# Patient Record
Sex: Male | Born: 1955 | Race: White | Hispanic: No | Marital: Married | Smoking: Former smoker
Health system: Southern US, Community
[De-identification: ages and names within clinical notes are randomized; demographics above are authoritative.]

## PROBLEM LIST (undated history)

## (undated) DIAGNOSIS — J342 Deviated nasal septum: Secondary | ICD-10-CM

## (undated) DIAGNOSIS — E039 Hypothyroidism, unspecified: Secondary | ICD-10-CM

## (undated) DIAGNOSIS — G4700 Insomnia, unspecified: Secondary | ICD-10-CM

## (undated) DIAGNOSIS — K589 Irritable bowel syndrome without diarrhea: Secondary | ICD-10-CM

## (undated) DIAGNOSIS — T7840XA Allergy, unspecified, initial encounter: Secondary | ICD-10-CM

## (undated) DIAGNOSIS — M545 Low back pain, unspecified: Secondary | ICD-10-CM

## (undated) DIAGNOSIS — M8580 Other specified disorders of bone density and structure, unspecified site: Secondary | ICD-10-CM

## (undated) DIAGNOSIS — G43909 Migraine, unspecified, not intractable, without status migrainosus: Secondary | ICD-10-CM

## (undated) DIAGNOSIS — M1990 Unspecified osteoarthritis, unspecified site: Secondary | ICD-10-CM

## (undated) DIAGNOSIS — M51369 Other intervertebral disc degeneration, lumbar region without mention of lumbar back pain or lower extremity pain: Secondary | ICD-10-CM

## (undated) DIAGNOSIS — F419 Anxiety disorder, unspecified: Secondary | ICD-10-CM

## (undated) DIAGNOSIS — G4730 Sleep apnea, unspecified: Secondary | ICD-10-CM

## (undated) DIAGNOSIS — H269 Unspecified cataract: Secondary | ICD-10-CM

## (undated) DIAGNOSIS — J3089 Other allergic rhinitis: Secondary | ICD-10-CM

## (undated) DIAGNOSIS — L301 Dyshidrosis [pompholyx]: Secondary | ICD-10-CM

## (undated) DIAGNOSIS — G4733 Obstructive sleep apnea (adult) (pediatric): Secondary | ICD-10-CM

## (undated) DIAGNOSIS — I00 Rheumatic fever without heart involvement: Secondary | ICD-10-CM

## (undated) DIAGNOSIS — S060XAA Concussion with loss of consciousness status unknown, initial encounter: Secondary | ICD-10-CM

## (undated) DIAGNOSIS — L821 Other seborrheic keratosis: Secondary | ICD-10-CM

## (undated) DIAGNOSIS — B351 Tinea unguium: Secondary | ICD-10-CM

## (undated) DIAGNOSIS — S060X9A Concussion with loss of consciousness of unspecified duration, initial encounter: Secondary | ICD-10-CM

## (undated) DIAGNOSIS — E785 Hyperlipidemia, unspecified: Secondary | ICD-10-CM

## (undated) DIAGNOSIS — K219 Gastro-esophageal reflux disease without esophagitis: Secondary | ICD-10-CM

## (undated) DIAGNOSIS — B029 Zoster without complications: Secondary | ICD-10-CM

## (undated) DIAGNOSIS — N200 Calculus of kidney: Secondary | ICD-10-CM

## (undated) DIAGNOSIS — K900 Celiac disease: Secondary | ICD-10-CM

## (undated) DIAGNOSIS — G629 Polyneuropathy, unspecified: Secondary | ICD-10-CM

## (undated) DIAGNOSIS — N400 Enlarged prostate without lower urinary tract symptoms: Secondary | ICD-10-CM

## (undated) DIAGNOSIS — M5126 Other intervertebral disc displacement, lumbar region: Secondary | ICD-10-CM

## (undated) DIAGNOSIS — M5136 Other intervertebral disc degeneration, lumbar region: Secondary | ICD-10-CM

## (undated) HISTORY — DX: Other intervertebral disc degeneration, lumbar region without mention of lumbar back pain or lower extremity pain: M51.369

## (undated) HISTORY — DX: Anxiety disorder, unspecified: F41.9

## (undated) HISTORY — DX: Migraine, unspecified, not intractable, without status migrainosus: G43.909

## (undated) HISTORY — DX: Irritable bowel syndrome, unspecified: K58.9

## (undated) HISTORY — DX: Deviated nasal septum: J34.2

## (undated) HISTORY — DX: Other intervertebral disc degeneration, lumbar region: M51.36

## (undated) HISTORY — PX: TENNIS ELBOW RELEASE/NIRSCHEL PROCEDURE: SHX6651

## (undated) HISTORY — DX: Other allergic rhinitis: J30.89

## (undated) HISTORY — DX: Unspecified cataract: H26.9

## (undated) HISTORY — PX: VASECTOMY: SHX75

## (undated) HISTORY — DX: Rheumatic fever without heart involvement: I00

## (undated) HISTORY — PX: COLONOSCOPY: SHX174

## (undated) HISTORY — DX: Polyneuropathy, unspecified: G62.9

## (undated) HISTORY — DX: Other seborrheic keratosis: L82.1

## (undated) HISTORY — PX: OTHER SURGICAL HISTORY: SHX169

## (undated) HISTORY — DX: Allergy, unspecified, initial encounter: T78.40XA

## (undated) HISTORY — DX: Zoster without complications: B02.9

## (undated) HISTORY — PX: EYE SURGERY: SHX253

## (undated) HISTORY — DX: Tinea unguium: B35.1

## (undated) HISTORY — DX: Hyperlipidemia, unspecified: E78.5

## (undated) HISTORY — DX: Gastro-esophageal reflux disease without esophagitis: K21.9

## (undated) HISTORY — DX: Unspecified osteoarthritis, unspecified site: M19.90

## (undated) HISTORY — DX: Insomnia, unspecified: G47.00

## (undated) HISTORY — DX: Benign prostatic hyperplasia without lower urinary tract symptoms: N40.0

## (undated) HISTORY — DX: Other intervertebral disc displacement, lumbar region: M51.26

## (undated) HISTORY — DX: Low back pain, unspecified: M54.50

## (undated) HISTORY — DX: Sleep apnea, unspecified: G47.30

## (undated) HISTORY — DX: Other specified disorders of bone density and structure, unspecified site: M85.80

## (undated) HISTORY — DX: Hypothyroidism, unspecified: E03.9

## (undated) HISTORY — DX: Concussion with loss of consciousness of unspecified duration, initial encounter: S06.0X9A

## (undated) HISTORY — PX: JOINT REPLACEMENT: SHX530

## (undated) HISTORY — DX: Concussion with loss of consciousness status unknown, initial encounter: S06.0XAA

## (undated) HISTORY — DX: Dyshidrosis (pompholyx): L30.1

## (undated) HISTORY — PX: SEPTOPLASTY: SUR1290

## (undated) HISTORY — DX: Obstructive sleep apnea (adult) (pediatric): G47.33

## (undated) HISTORY — DX: Calculus of kidney: N20.0

## (undated) HISTORY — PX: REPLACEMENT TOTAL KNEE: SUR1224

## (undated) HISTORY — DX: Celiac disease: K90.0

---
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
## 2000-06-08 ENCOUNTER — Ambulatory Visit (HOSPITAL_BASED_OUTPATIENT_CLINIC_OR_DEPARTMENT_OTHER): Admission: RE | Admit: 2000-06-08 | Discharge: 2000-06-08 | Payer: Self-pay | Admitting: *Deleted

## 2005-09-08 ENCOUNTER — Ambulatory Visit (HOSPITAL_BASED_OUTPATIENT_CLINIC_OR_DEPARTMENT_OTHER): Admission: RE | Admit: 2005-09-08 | Discharge: 2005-09-08 | Payer: Self-pay | Admitting: Urology

## 2005-11-28 ENCOUNTER — Ambulatory Visit: Payer: Self-pay | Admitting: Gastroenterology

## 2006-01-25 ENCOUNTER — Other Ambulatory Visit: Admission: RE | Admit: 2006-01-25 | Discharge: 2006-01-25 | Payer: Self-pay | Admitting: Gastroenterology

## 2006-01-25 ENCOUNTER — Encounter (INDEPENDENT_AMBULATORY_CARE_PROVIDER_SITE_OTHER): Payer: Self-pay | Admitting: Specialist

## 2006-01-25 ENCOUNTER — Ambulatory Visit: Payer: Self-pay | Admitting: Gastroenterology

## 2006-03-06 ENCOUNTER — Ambulatory Visit: Payer: Self-pay | Admitting: Gastroenterology

## 2006-11-08 ENCOUNTER — Ambulatory Visit (HOSPITAL_BASED_OUTPATIENT_CLINIC_OR_DEPARTMENT_OTHER): Admission: RE | Admit: 2006-11-08 | Discharge: 2006-11-08 | Payer: Self-pay | Admitting: Otolaryngology

## 2007-06-18 ENCOUNTER — Ambulatory Visit: Payer: Self-pay | Admitting: Surgery

## 2007-06-18 ENCOUNTER — Encounter (INDEPENDENT_AMBULATORY_CARE_PROVIDER_SITE_OTHER): Payer: Self-pay | Admitting: Family Medicine

## 2007-06-18 ENCOUNTER — Ambulatory Visit (HOSPITAL_COMMUNITY): Admission: RE | Admit: 2007-06-18 | Discharge: 2007-06-18 | Payer: Self-pay | Admitting: Family Medicine

## 2008-04-23 ENCOUNTER — Encounter: Admission: RE | Admit: 2008-04-23 | Discharge: 2008-04-23 | Payer: Self-pay | Admitting: Family Medicine

## 2008-05-20 ENCOUNTER — Encounter: Payer: Self-pay | Admitting: Gastroenterology

## 2008-07-17 ENCOUNTER — Ambulatory Visit (HOSPITAL_BASED_OUTPATIENT_CLINIC_OR_DEPARTMENT_OTHER): Admission: RE | Admit: 2008-07-17 | Discharge: 2008-07-17 | Payer: Self-pay | Admitting: Otolaryngology

## 2008-07-19 ENCOUNTER — Ambulatory Visit: Payer: Self-pay | Admitting: Internal Medicine

## 2009-01-19 ENCOUNTER — Encounter: Payer: Self-pay | Admitting: Gastroenterology

## 2009-01-19 ENCOUNTER — Ambulatory Visit (HOSPITAL_COMMUNITY): Admission: RE | Admit: 2009-01-19 | Discharge: 2009-01-19 | Payer: Self-pay | Admitting: Otolaryngology

## 2009-03-10 ENCOUNTER — Encounter (INDEPENDENT_AMBULATORY_CARE_PROVIDER_SITE_OTHER): Payer: Self-pay | Admitting: *Deleted

## 2009-03-10 ENCOUNTER — Ambulatory Visit: Payer: Self-pay | Admitting: Gastroenterology

## 2009-03-10 DIAGNOSIS — K219 Gastro-esophageal reflux disease without esophagitis: Secondary | ICD-10-CM | POA: Insufficient documentation

## 2009-03-10 DIAGNOSIS — K589 Irritable bowel syndrome without diarrhea: Secondary | ICD-10-CM | POA: Insufficient documentation

## 2009-03-15 ENCOUNTER — Telehealth: Payer: Self-pay | Admitting: Gastroenterology

## 2009-03-19 ENCOUNTER — Ambulatory Visit: Payer: Self-pay | Admitting: Gastroenterology

## 2009-03-23 ENCOUNTER — Ambulatory Visit (HOSPITAL_COMMUNITY): Admission: RE | Admit: 2009-03-23 | Discharge: 2009-03-23 | Payer: Self-pay | Admitting: Gastroenterology

## 2009-03-24 ENCOUNTER — Encounter: Payer: Self-pay | Admitting: Gastroenterology

## 2009-04-07 ENCOUNTER — Ambulatory Visit: Payer: Self-pay | Admitting: Gastroenterology

## 2009-04-07 DIAGNOSIS — K3184 Gastroparesis: Secondary | ICD-10-CM | POA: Insufficient documentation

## 2009-04-16 ENCOUNTER — Telehealth: Payer: Self-pay | Admitting: Gastroenterology

## 2009-04-20 ENCOUNTER — Ambulatory Visit: Payer: Self-pay | Admitting: Gastroenterology

## 2009-04-20 DIAGNOSIS — E785 Hyperlipidemia, unspecified: Secondary | ICD-10-CM | POA: Insufficient documentation

## 2009-04-20 DIAGNOSIS — R197 Diarrhea, unspecified: Secondary | ICD-10-CM | POA: Insufficient documentation

## 2009-04-20 DIAGNOSIS — F411 Generalized anxiety disorder: Secondary | ICD-10-CM | POA: Insufficient documentation

## 2009-04-27 ENCOUNTER — Telehealth: Payer: Self-pay | Admitting: Physician Assistant

## 2009-07-12 ENCOUNTER — Telehealth: Payer: Self-pay | Admitting: Physician Assistant

## 2009-08-11 ENCOUNTER — Telehealth (INDEPENDENT_AMBULATORY_CARE_PROVIDER_SITE_OTHER): Payer: Self-pay | Admitting: *Deleted

## 2010-03-01 NOTE — Procedures (Signed)
Summary: Upper Endoscopy  Patient: Joseph Fuentes Note: All result statuses are Final unless otherwise noted.  Tests: (1) Upper Endoscopy (EGD)   EGD Upper Endoscopy       DONE     [facility]     [address]     [place], [place]  [address]           ENDOSCOPY PROCEDURE REPORT           PATIENT:  Joseph Fuentes, Joseph Fuentes  MR#:  [id number]     BIRTHDATE:  [birth year], 53 yrs. old  GENDER:  male           ENDOSCOPIST:  Judie Petit T. Russella Dar, MD, [facility]           PROCEDURE DATE:  03/19/2009     PROCEDURE:  EGD with biopsy     ASA CLASS:  Class II     INDICATIONS:  GERD           MEDICATIONS:  Fentanyl 75 mcg IV, Versed 7 mg IV     TOPICAL ANESTHETIC:  Exactacain Spray           DESCRIPTION OF PROCEDURE:   After the risks benefits and     alternatives of the procedure were thoroughly explained, informed     consent was obtained.  The [facility] GIF-H180 [id number] endoscope was     introduced through the mouth and advanced to the second portion of     the duodenum, without limitations.  The instrument was slowly     withdrawn as the mucosa was fully examined.     <<PROCEDUREIMAGES>>           The esophagus and gastroesophageal junction were completely normal     in appearance. The stomach was entered and closely examined. The     pylorus, angularis, and lesser curvature were well visualized,     including a retroflexed view of the cardia and fundus. The stomach     wall was normally distensable. The scope passed easily through the     pylorus into the duodenum.  The duodenal bulb was normal in     appearance, as was the postbulbar duodenum. Mild gastritis was     found in the antrum. It was erythematous. Multiple biopsies were     obtained and sent to pathology.  Retroflexed views revealed no     abnormalities.    The scope was then withdrawn from the patient     and the procedure completed.           COMPLICATIONS:  None           ENDOSCOPIC IMPRESSION:     1) Mild gastritis     2) GERD         RECOMMENDATIONS:     1) anti-reflux regimen with 4 " bedblocks     2) await pathology results     3) PPI:  Dexilant 60mg  po qam #34,  5 refills and omeprazole     40mg  po qpm #34,  5 refills     4) Call office to schedule an office appointment for 6-8 weeks           Tayron Hunnell T. Russella Dar, MD, [facility]           CC:  [name], MD    Christia Reading, MD           n.     Rosalie Doctor:   Venita Lick. Marlyn Rabine at 03/19/2009 03:14 PM  [name], [phone]  Note: An exclamation mark (!) indicates a result that was not dispersed into the flowsheet. Document Creation Date: 03/19/2009 3:14 PM _______________________________________________________________________  (1) Order result status: Final Collection or observation date-time: 03/19/2009 15:08 Requested date-time:  Receipt date-time:  Reported date-time:  Referring Physician:   Ordering Physician: Claudette Head [id number]) Specimen Source:  Source: Launa Grill Order Number: [id number] Lab site:

## 2010-03-01 NOTE — Progress Notes (Signed)
  Phone Note Other Incoming   Request: Send Hydrologist of Call: REQUEST FOR RECORDS FROM [facility] FORWARDED TO [facility].

## 2010-03-01 NOTE — Procedures (Signed)
Summary: pH/[facility]  pH/[facility]   Imported By: Lester Cochise 03/15/2009 10:37:16  _____________________________________________________________________  External Attachment:    Type:   Image     Comment:   External Document

## 2010-03-01 NOTE — Miscellaneous (Signed)
Summary: meds ordered in RR  Clinical Lists Changes  Medications: Added new medication of DEXILANT 60 MG CPDR (DEXLANSOPRAZOLE) by mouth q am #34, 5 refills Added new medication of OMEPRAZOLE 40 MG  CPDR (OMEPRAZOLE) 1 each day 30 minutes before meal by mouth qpm, #34, 5 refills - Signed Added new medication of ROBINUL-FORTE 2 MG  TABS (GLYCOPYRROLATE) 1 by mouth two times a day as needed #34, 11 refills - Signed Rx of OMEPRAZOLE 40 MG  CPDR (OMEPRAZOLE) 1 each day 30 minutes before meal by mouth qpm, #34, 5 refills;  #34 x 5;  Signed;  Entered by: Doristine Church RN II;  Authorized by: Meryl Dare MD [facility];  Method used: [facility]  [address] [address] [place], [place], [place]  [address], Ph: [phone], Fax: [phone] Rx of ROBINUL-FORTE 2 MG  TABS (GLYCOPYRROLATE) 1 by mouth two times a day as needed #34, 11 refills;  #34 x 11;  Signed;  Entered by: Doristine Church RN II;  Authorized by: Meryl Dare MD [facility];  Method used: [facility]  [address] [address], [place], [place], [place]  [address], Ph: [phone], Fax: [phone] Observations: Added new observation of ALLERGY REV: Done (03/19/2009 15:41)    Prescriptions: ROBINUL-FORTE 2 MG  TABS (GLYCOPYRROLATE) 1 by mouth two times a day as needed #34, 11 refills  #34 x 11   Entered by:   Doristine Church RN II   Authorized by:   Meryl Dare MD [facility]   Signed by:   Doristine Church RN II on 03/19/2009   Method used:   [facility]ron[facility] to        [facility]  [address] (retail)       [address]       [place], [place]  [address]       Ph: [phone]       Fax: [phone]   RxID:   [id number] OMEPRAZOLE 40 MG  CPDR (OMEPRAZOLE) 1 each day 30 minutes before meal by mouth qpm, #34, 5 refills  #34 x 5   Entered by:   Doristine Church RN II   Authorized by:   Meryl Dare MD [facility]   Signed by:   Doristine Church RN II on 03/19/2009  Method used:   [facility]ronically to        [facility]  [address] (retail)       [address]       [place], [place]  [address]       Ph: [phone]       Fax: [phone]   RxID:   [id number]   Appended Document: meds ordered in RR    Clinical Lists Changes  Medications: Changed medication from DEXILANT 60 MG CPDR (DEXLANSOPRAZOLE) by mouth q am #34, 5 refills to DEXILANT 60 MG CPDR (DEXLANSOPRAZOLE) by mouth q am - Signed Changed medication from OMEPRAZOLE 40 MG  CPDR (OMEPRAZOLE) 1 each day 30 minutes before meal by mouth qpm, #34, 5 refills to OMEPRAZOLE 40 MG  CPDR (OMEPRAZOLE) 1 each day 30 minutes before meal by mouth qpm - Signed Rx of DEXILANT 60 MG CPDR (DEXLANSOPRAZOLE) by mouth q am;  #90 x 2;  Signed;  Entered by: Doristine Church RN II;  Authorized by: Meryl Dare MD [facility];  Method used: Print then Give to  Patient Rx of OMEPRAZOLE 40 MG  CPDR (OMEPRAZOLE) 1 each day 30 minutes before meal by mouth qpm;  #90 x 2;  Signed;  Entered by: Doristine Church RN II;  Authorized by: Meryl Dare MD [facility];  Method used: Print then Give to Patient    Prescriptions: OMEPRAZOLE 40 MG  CPDR (OMEPRAZOLE) 1 each day 30 minutes before meal by mouth qpm  #90 x 2   Entered by:   Doristine Church RN II   Authorized by:   Meryl Dare MD [facility]   Signed by:   Doristine Church RN II on 03/19/2009   Method used:   Print then Give to Patient   RxID:   [id number] DEXILANT 60 MG CPDR (DEXLANSOPRAZOLE) by mouth q am  #90 x 2   Entered by:   Doristine Church RN II   Authorized by:   Meryl Dare MD [facility]   Signed by:   Doristine Church RN II on 03/19/2009   Method used:   Print then Give to Patient   RxID:   [id number]

## 2010-03-01 NOTE — Assessment & Plan Note (Signed)
Summary: DISTAL IMPROXIMAL CHANNEL REFLUX/YF   History of Present Illness Visit Type: Follow-up Consult Primary GI MD: Elie Goody MD [facility] Primary Provider: [name], MD Requesting Provider: Christia Reading, MD Chief Complaint: Patient has been referred back to have after going to see his ENT. He complains that his reflux has been getting worse over time. Dr. Jenne Pane ordered a PH probe in December.   History of Present Illness:   This is a 55 year old male who relates chronic problems with throat clearing and belching. He also notes sinus drainage, sinus congestion and a "mucousy cough,". Most recently, he has been treated with omeprazole 40 mg twice daily without improvement in his symptoms.  He underwent endoscopy in December 2007 which showed Candida esophagitis and no other abnormalities. Colonoscopy performed in December 2007 showed only internal hemorrhoids. He underwent an ambulatory dual probe pH study on 12/21/ 2010 off acid suppressants which showed a DeMeester score of 43.6. There was distal Channel and proximal channel abnormal acid reflux, as well as upright and recumbent reflux. Symptom correlation was good with meals, belching, and spitting mucous. Correlation with cough was fair.   GI Review of Systems    Reports acid reflux and  belching.      Denies abdominal pain, bloating, chest pain, dysphagia with liquids, dysphagia with solids, heartburn, loss of appetite, nausea, vomiting, vomiting blood, weight loss, and  weight gain.        Denies anal fissure, black tarry stools, change in bowel habit, constipation, diarrhea, diverticulosis, fecal incontinence, heme positive stool, hemorrhoids, irritable bowel syndrome, jaundice, light color stool, liver problems, rectal bleeding, and  rectal pain. Preventive Screening-Counseling & Management  Alcohol-Tobacco     Smoking Status: quit      Drug Use:  no.     Current Medications (verified): 1)  Flax Seed Oil 1000 Mg Caps  (Flaxseed (Linseed)) .... Once Daily 2)  Finasteride 5 Mg Tabs (Finasteride) .... One Tablet By Mouth Once Daily 3)  Relpax 40 Mg Tabs (Eletriptan Hydrobromide) .... One Tablet By Mouth Once Daily, Dose May Be Repeated After 2 Hours As Needed 4)  Proctosol Hc 2.5 % Crea (Hydrocortisone) .... As Needed 5)  Simvastatin 40 Mg Tabs (Simvastatin) .... One Tablet By Mouth Once Daily 6)  Flovent Hfa 44 Mcg/act Aero (Fluticasone Propionate  Hfa) .... Inhale 2 Puffs Once Daily 7)  Hydrocortisone Acetate 25 Mg Supp (Hydrocortisone Acetate) .... Insert 1 Supppository Two Times A Day Once Daily For 2 Weeks 8)  Ambien 10 Mg Tabs (Zolpidem Tartrate) .... One Tablet By Mouth Once Daily 9)  Lovaza 1 Gm Caps (Omega-3-Acid Ethyl Esters) .... 2 Capsules By Mouth Once Daily 10)  Docusate Sodium 100 Mg Tabs (Docusate Sodium) .... One Capsule By Mouth Once Daily At Bedtime 11)  Topamax 100 Mg Tabs (Topiramate) .... Once Daily 12)  Calcium Carbonate 600 Mg Tabs (Calcium Carbonate) .... Once Daily 13)  Ginkgo Biloba 120 Mg Caps (Ginkgo Biloba) .... Once Daily 14)  Synthroid 100 Mcg Tabs (Levothyroxine Sodium) .... One Tablet By Mouth Once Daily 15)  Excedrin Extra Strength 250-250-65 Mg Tabs (Aspirin-Acetaminophen-Caffeine) .... As Needed 16)  Hyoscyamine Sulfate 0.125 Mg Tabs (Hyoscyamine Sulfate) .... One Tablet By Mouth Every 4 Hours As Needed 17)  Prilosec 40 Mg Cpdr (Omeprazole) .... Take One By Mouth Two Times A Day  Allergies (verified): 1)  ! Penicillin  Past History:  Past Medical History: Last updated: 03/09/2009 GERD Gastritis Irritable Bowel Syndrome Hyperlipidemia Hypothyroidism Migraine headaches Sleep Apnea Anxiety  Disorder Rheumatic fever 1964 Hemorrhoids  Past Surgical History: Last updated: 03/09/2009 Vasectomy Septoplasty  Family History: Family History of Celiac Disease:Daughter Family History of Diabetes: Mother Family History of Heart Disease: maternal grandfather No FH of  Colon Cancer: Family History of Lung Cancer: Pateranl Grandfather  Social History: Patient is a former smoker.  Alcohol Use - yes rare Daily Caffeine Use 32 ounces per day Illicit Drug Use - no Smoking Status:  quit Drug Use:  no  Review of Systems       The patient complains of allergy/sinus, cough, coughing up blood, fatigue, headaches-new, muscle pains/cramps, and sleeping problems.         The pertinent positives and negatives are noted as above and in the HPI. All other ROS were reviewed and were negative.   Vital Signs:  Patient profile:   55 year old male Height:      72 inches Weight:      222.6 pounds BMI:     30.30 Pulse rate:   68 / minute Pulse rhythm:   regular BP sitting:   122 / 58  (right arm) Cuff size:   regular  Vitals Entered By: Harlow Mares CMA Duncan Dull) (March 10, 2009 3:17 PM)  Physical Exam  General:  Well developed, well nourished, no acute distress. Head:  Normocephalic and atraumatic. Eyes:  PERRLA, no icterus. Mouth:  No deformity or lesions, dentition normal. Lungs:  Clear throughout to auscultation. Heart:  Regular rate and rhythm; no murmurs, rubs,  or bruits. Abdomen:  Soft, nontender and nondistended. No masses, hepatosplenomegaly or hernias noted. Normal bowel sounds. Psych:  Alert and cooperative. Normal mood and affect.  Impression & Recommendations:  Problem # 1:  GERD (ICD-530.81) GERD with presumed LPR. His sinus congestion and sinus drainage is likely unrelated to GERD. His symptom control is poor. Discontinue omeprazole and begin Nexium 40 mg twice daily taken one half hour before breakfast and dinner. Intensifiy all antireflux measures. Begin the long-term use of 4" bedblocks. Rule out esophagitis and gastroparesis. The risks, benefits and alternatives to endoscopy with possible biopsy and possible dilation were discussed with the patient and they consent to proceed. The procedure will be scheduled electively. Orders: EGD  (EGD) Gastric Emptying Scan (GES)  Problem # 2:  IRRITABLE BOWEL SYNDROME (ICD-564.1) Continue hyoscyamine as needed.  Patient Instructions: 1)  Upper Endoscopy brochure given.  2)  You have been scheduled for a Gastric Empyting Scan.  3)  Pick up your prescriptions at your pharmacy.  4)  Avoid foods high in acid content ( tomatoes, citrus juices, spicy foods) . Avoid eating within 3 to 4 hours of lying down or before exercising. Do not over eat; try smaller more frequent meals. Elevate head of bed four inches when sleeping.  5)  Copy sent to : [name], MD 6)                         Christia Reading, MD 7)  The medication list was reviewed and reconciled.  All changed / newly prescribed medications were explained.  A complete medication list was provided to the patient / caregiver.  Prescriptions: NEXIUM 40 MG CPDR (ESOMEPRAZOLE MAGNESIUM) one tablet by mouth two times a day before meals  #60 x 11   Entered by:   Christie Nottingham CMA (AAMA)   Authorized by:   Meryl Dare MD [facility]   Signed by:   Meryl Dare MD FACG on 03/10/2009  Method used:   [facility]       [address]       [place], [place]  [address]       Ph: [phone]       Fax: [phone]   RxID:   [id number]

## 2010-03-01 NOTE — Letter (Signed)
Summary: [facility]  [facility]   Imported By: Lester Corning 03/15/2009 10:35:42  _____________________________________________________________________  External Attachment:    Type:   Image     Comment:   External Document

## 2010-03-01 NOTE — Assessment & Plan Note (Signed)
Summary: f/u Gastroparesis/all   History of Present Illness Visit Type: Follow-up Visit Primary GI MD: Elie Goody MD [facility] Primary Provider: [name], MD Requesting Provider: n/a Chief Complaint: F/u for gastroparesis and procedures. Pt denies any GI complaints at this time History of Present Illness:   Joseph Fuentes returns for followup today with his wife. He states his reflux symptoms are under better control with Dexilant in the morning and omeprazole in the evening. He underwent a gastric emptying scan following results:  Calculated retention in the stomach is 92% at 1 hour and 50% at 2 hours.  Normal retention is less than 30% at 2 hours.  IMPRESSION: Moderately delayed solid gastric emptying.  He is having occasional episodes of postprandial urgency, and diarrhea, typical for his IBS.     GI Review of Systems      Denies abdominal pain, acid reflux, belching, bloating, chest pain, dysphagia with liquids, dysphagia with solids, heartburn, loss of appetite, nausea, vomiting, vomiting blood, weight loss, and  weight gain.      Reports diarrhea.     Denies anal fissure, black tarry stools, change in bowel habit, constipation, diverticulosis, fecal incontinence, heme positive stool, hemorrhoids, irritable bowel syndrome, jaundice, light color stool, liver problems, rectal bleeding, and  rectal pain.   Current Medications (verified): 1)  Flax Seed Oil 1000 Mg Caps (Flaxseed (Linseed)) .... Once Daily 2)  Relpax 40 Mg Tabs (Eletriptan Hydrobromide) .... One Tablet By Mouth Once Daily, Dose May Be Repeated After 2 Hours As Needed 3)  Proctosol Hc 2.5 % Crea (Hydrocortisone) .... As Needed 4)  Simvastatin 40 Mg Tabs (Simvastatin) .... 1/2 Tablet Every Other Day 5)  Flovent Hfa 44 Mcg/act Aero (Fluticasone Propionate  Hfa) .... Inhale 2 Puffs Once Daily 6)  Hydrocortisone Acetate 25 Mg Supp (Hydrocortisone Acetate) .... As Needed 7)  Ambien 10 Mg Tabs (Zolpidem Tartrate) .... One  Tablet By Mouth Once Daily 8)  Lovaza 1 Gm Caps (Omega-3-Acid Ethyl Esters) .... 2 Capsules By Mouth Two Times A Day 9)  Docusate Sodium 100 Mg Tabs (Docusate Sodium) .... One Capsule By Mouth Once Daily At Bedtime 10)  Topamax 100 Mg Tabs (Topiramate) .... One and Half Tablet By Mouth Once Daily 11)  Calcium Carbonate 600 Mg Tabs (Calcium Carbonate) .... Two Times A Day 12)  Ginkgo Biloba 120 Mg Caps (Ginkgo Biloba) .... Once Daily 13)  Synthroid 100 Mcg Tabs (Levothyroxine Sodium) .... One Tablet By Mouth Once Daily 14)  Excedrin Extra Strength 250-250-65 Mg Tabs (Aspirin-Acetaminophen-Caffeine) .... As Needed 15)  Dexilant 60 Mg Cpdr (Dexlansoprazole) .... By Mouth Q Am 16)  Omeprazole 40 Mg  Cpdr (Omeprazole) .... One Tablet At Bedtime 17)  Robinul-Forte 2 Mg  Tabs (Glycopyrrolate) .[name].. 1 By Mouth Two Times A Day As Needed #34, 11 Refills  Allergies (verified): 1)  ! Penicillin  Past History:  Past Medical History: GERD Gastritis Irritable Bowel Syndrome Hyperlipidemia Hypothyroidism Migraine headaches Sleep Apnea Anxiety Disorder Rheumatic fever 1964 Hemorrhoids Gastroparesis: 50% solid retention at 2 hours  Past Surgical History: Reviewed history from 03/09/2009 and no changes required. Vasectomy Septoplasty  Family History: Reviewed history from 03/10/2009 and no changes required. Family History of Celiac Disease:Daughter Family History of Diabetes: Mother Family History of Heart Disease: maternal grandfather No FH of Colon Cancer: Family History of Lung Cancer: Pateranl Grandfather  Social History: Reviewed history from 03/10/2009 and no changes required. Patient is a former smoker.  Alcohol Use - yes rare Daily Caffeine Use 32  ounces per day Illicit Drug Use - no  Review of Systems       The pertinent positives and negatives are noted as above and in the HPI. All other ROS were reviewed and were negative.   Vital Signs:  Patient profile:   55 year old  male Height:      72 inches Weight:      220 pounds BMI:     29.95 BSA:     2.22 Pulse rate:   64 / minute Pulse rhythm:   regular BP sitting:   126 / 68  (left arm) Cuff size:   regular  Vitals Entered By: Ok Anis CMA (April 07, 2009 1:33 PM)  Physical Exam  General:  Well developed, well nourished, no acute distress. Head:  Normocephalic and atraumatic. Eyes:  PERRLA, no icterus. Mouth:  No deformity or lesions, dentition normal. Lungs:  Clear throughout to auscultation. Heart:  Regular rate and rhythm; no murmurs, rubs,  or bruits. Abdomen:  Soft, nontender and nondistended. No masses, hepatosplenomegaly or hernias noted. Normal bowel sounds. Psych:  Alert and cooperative. Normal mood and affect.  Impression & Recommendations:  Problem # 1:  GERD (ICD-530.81) GERD exacerbated by gastroparesis. Continue Dexilant 60 mg q.a.m. and omeprazole 40 mg q.p.m. Continue all standard antireflux measures.  Problem # 2:  GASTROPARESIS (ICD-536.3) We discussed in detail the long-term dietary measures for gastroparesis. Supplied the patient and his wife with literature on a recommended diet. We discussed erythromycin, which he states needs to nausea. He is concerned about potential side effects of metoclopramide and is not interested and taking it at this time. We also discussed domperidone. He prefers to try dietary measures for the next 3 months and assess response.  Problem # 3:  IRRITABLE BOWEL SYNDROME (ICD-564.1) Robinul twice daily as needed. I explained that this medication may slow intestinal motility and further delayed gastric emptying, so I recommend that he use it only as needed.  Patient Instructions: 1)  Liquids and foods should be eaten in small, frequent meals. Refer to brochure for further instruction.  2)  Please schedule a follow-up appointment in 3 months. 3)  Copy sent to : [name], MD 4)  The medication list was reviewed and reconciled.  All changed / newly  prescribed medications were explained.  A complete medication list was provided to the patient / caregiver.

## 2010-03-01 NOTE — Progress Notes (Signed)
Summary: Question about medication  Phone Note Call from Patient Call back at Work Phone [phone]   Caller: Patient Call For: Dr. Shelda Jakes Reason for Call: Talk to Nurse Summary of Call: Pt has some questions about his Dexilant Dosage Initial call taken by: Karna Christmas,  March 15, 2009 12:36 PM  Follow-up for Phone Call        Pt was given samples of Nexium and Dexilant in the office to try and call back to find out which one works better. Pt wanted to know how to take the Dexilant. Told pt to take it one tablet by mouth once daily. Pt agreed and will call back after he finishes samples of both PPI's.  Follow-up by: Christie Nottingham CMA Duncan Dull),  March 15, 2009 12:51 PM

## 2010-03-01 NOTE — Letter (Signed)
Summary: EGD Instructions  [facility]  [address] [place], [place] [address]   Phone: [phone]  Fax: [phone]       Joseph Fuentes    29-Jun-1955    MRN: [id number]       Procedure Day /Date: Friday February 18th, 2011     Arrival Time:  1:30pm     Procedure Time: 2:30pm     Location of Procedure:                    _ x _ [facility] (4th Floor)    PREPARATION FOR ENDOSCOPY   On 03/19/09  THE DAY OF THE PROCEDURE:  1.   No solid foods, milk or milk products are allowed after midnight the night before your procedure.  2.   Do not drink anything colored red or purple.  Avoid juices with pulp.  No orange juice.  3.  You may drink clear liquids until 12:30pm , which is 2 hours before your procedure.                                                                                                CLEAR LIQUIDS INCLUDE: Water Jello Ice Popsicles Tea (sugar ok, no milk/cream) Powdered fruit flavored drinks Coffee (sugar ok, no milk/cream) Gatorade Juice: apple, white grape, white cranberry  Lemonade Clear bullion, consomm, broth Carbonated beverages (any kind) Strained chicken noodle soup Hard Candy   MEDICATION INSTRUCTIONS  Unless otherwise instructed, you should take regular prescription medications with a small sip of water as early as possible the morning of your procedure.          OTHER INSTRUCTIONS  You will need a responsible adult at least 53 years of age to accompany you and drive you home.   This person must remain in the waiting room during your procedure.  Wear loose fitting clothing that is easily removed.  Leave jewelry and other valuables at home.  However, you may wish to bring a book to read or an iPod/MP3 player to listen to music as you wait for your procedure to start.  Remove all body piercing jewelry and leave at home.  Total time from sign-in until discharge is approximately 2-3 hours.  You should go  home directly after your procedure and rest.  You can resume normal activities the day after your procedure.  The day of your procedure you should not:   Drive   Make legal decisions   Operate machinery   Drink alcohol   Return to work  You will receive specific instructions about eating, activities and medications before you leave.    The above instructions have been reviewed and explained to me by   Marchelle Folks.     I fully understand and can verbalize these instructions _____________________________ Date _________

## 2010-03-01 NOTE — Letter (Signed)
Summary: Patient Notice-Endo Biopsy Results  [facility]  [address] [place], [place] [address]   Phone: [phone]  Fax: [phone]        March 24, 2009 MRN: [id number]    Joseph Fuentes [address] [place], [place]  [address]    Dear [name],  I am pleased to inform you that the biopsies taken during your recent endoscopic examination did not show any evidence of cancer upon pathologic examination. The biopsy showed mild gastritis.  Continue with the treatment plan as outlined on the day of your      exam.  Please call us if you are having persistent problems or have questions about your condition that have not been fully answered at this time.  Sincerely,  Meryl Dare MD [facility]  This letter has been electronically signed by your physician.  Appended Document: Patient Notice-Endo Biopsy Results Letter mailed 2.25.11

## 2010-03-01 NOTE — Assessment & Plan Note (Signed)
Summary: diarrhea/nausea/sheri   History of Present Illness Visit Type: Follow-up Visit Primary GI MD: Elie Goody MD [facility] Primary Provider: [name], MD Requesting Provider: n/a Chief Complaint: severe diarrhea and nausea lost 8 lbs in 1 week  some chest pain  lt. leg pain and numbness in toes History of Present Illness:   55 YO MALE KNOWN TO DR  Russella Dar WHO HAS DX OF GERD,IBS AND IDIOPATHIC GASTROPARESIS. HE WAS LAST SEEN ON 04/07/09. HE WAS STARTED ON DEXILANT TOWARD THE END OF FEBRUARY  AS HI REFLUX SXS WERE POORLY CONTROLLED ON TWICE DAILY OMEPRAZOLE 40 MG. AFTER STARTING THE DEXILANT HE BEGAN HAVING DIARRHEA WHICH PROGRESSED,EVENTUALLY WHEN HE RAN OUT OF SAMPLES HE WAS OFF OF IT FOR SEVERAL DAYS AND THE DIARRHEA IMPROVED. HE WENT BACK ON IT AND HAD WORSENING DIARRHEA. HE GOT TO THE POINT LAST WEEK,HE HAD TO STAY HOME FROM WORK AND ALSO FELT NAUSEATED.HE HAD BEEN HAVING 7-8 BM'S DAY. HE WENT OFF OF IT ABOUT 5 DAYS AGO AND FEELS MUCH BETTER. HIS REFLUX IS WORSE. HE DOES NOT HAVE MUCH HEATBURN BUT HAS ALOT OF PHLEGM,SOME LIQUID IN HIS THROAT, AND FREQUENT THROAT CLEARING.HE AND HIS WIFE ARE FRUSTRATED.HE IS ALSO CONCERNED ABOUT OSTEOPOROSIS,SINCE READING ABOUT LONG TERM SIDE EFFECTS.   GI Review of Systems    Reports abdominal pain, acid reflux, heartburn, loss of appetite, nausea, and  weight loss.     Location of  Abdominal pain: lower abdomen. Weight loss of 8  pounds over 2 WEEKS.   Denies belching, bloating, chest pain, dysphagia with liquids, dysphagia with solids, vomiting, and  vomiting blood.      Reports change in bowel habits, diarrhea, and  irritable bowel syndrome.     Denies anal fissure, black tarry stools, constipation, hemorrhoids, jaundice, light color stool, liver problems, rectal bleeding, and  rectal pain.    Current Medications (verified): 1)  Flax Seed Oil 1000 Mg Caps (Flaxseed (Linseed)) .... Once Daily 2)  Relpax 40 Mg Tabs (Eletriptan Hydrobromide) .... One  Tablet By Mouth Once Daily, Dose May Be Repeated After 2 Hours As Needed 3)  Proctosol Hc 2.5 % Crea (Hydrocortisone) .... As Needed 4)  Simvastatin 40 Mg Tabs (Simvastatin) .... 1/2 Tablet Every Other Day 5)  Flovent Hfa 44 Mcg/act Aero (Fluticasone Propionate  Hfa) .... Inhale 2 Puffs Once Daily 6)  Hydrocortisone Acetate 25 Mg Supp (Hydrocortisone Acetate) .... As Needed 7)  Ambien 10 Mg Tabs (Zolpidem Tartrate) .... One Tablet By Mouth Once Daily 8)  Lovaza 1 Gm Caps (Omega-3-Acid Ethyl Esters) .... 2 Capsules By Mouth Two Times A Day 9)  Docusate Sodium 100 Mg Tabs (Docusate Sodium) .... One Capsule By Mouth Once Daily At Bedtime 10)  Topamax 100 Mg Tabs (Topiramate) .... One and Half Tablet By Mouth Once Daily 11)  Calcium Carbonate 600 Mg Tabs (Calcium Carbonate) .... Two Times A Day 12)  Ginkgo Biloba 120 Mg Caps (Ginkgo Biloba) .... Once Daily 13)  Synthroid 100 Mcg Tabs (Levothyroxine Sodium) .... One Tablet By Mouth Once Daily 14)  Excedrin Extra Strength 250-250-65 Mg Tabs (Aspirin-Acetaminophen-Caffeine) .... As Needed 15)  Omeprazole 40 Mg  Cpdr (Omeprazole) .... One Tablet Po Every Morning and One Tablet At Bedtime 16)  Robinul-Forte 2 Mg  Tabs (Glycopyrrolate) .[name].. 1 By Mouth Two Times A Day As Needed #34, 11 Refills  Allergies (verified): 1)  ! Penicillin  Past History:  Past Medical History: Reviewed history from 04/07/2009 and no changes required. GERD Gastritis Irritable Bowel  Syndrome Hyperlipidemia Hypothyroidism Migraine headaches Sleep Apnea Anxiety Disorder Rheumatic fever 1964 Hemorrhoids Gastroparesis: 50% solid retention at 2 hours  Past Surgical History: Reviewed history from 03/09/2009 and no changes required. Vasectomy Septoplasty  Family History: Reviewed history from 03/10/2009 and no changes required. Family History of Celiac Disease:Daughter Family History of Diabetes: Mother Family History of Heart Disease: maternal grandfather No FH of  Colon Cancer: Family History of Lung Cancer: Pateranl Grandfather  Social History: Reviewed history from 03/10/2009 and no changes required. Patient is a former smoker.  Alcohol Use - yes rare Daily Caffeine Use 32 ounces per day Illicit Drug Use - no Married  1 girl and 1 boy  Review of Systems       The patient complains of fatigue, muscle pains/cramps, and sleeping problems.  The patient denies allergy/sinus, anemia, anxiety-new, arthritis/joint pain, back pain, blood in urine, breast changes/lumps, change in vision, confusion, cough, coughing up blood, depression-new, fainting, fever, headaches-new, hearing problems, heart murmur, heart rhythm changes, itching, night sweats, nosebleeds, shortness of breath, skin rash, sore throat, swelling of feet/legs, swollen lymph glands, thirst - excessive, urination - excessive, urination changes/pain, urine leakage, vision changes, and voice change.         ROS OTHERWISE AS IN HPI  Vital Signs:  Patient profile:   55 year old male Height:      72 inches Weight:      215 pounds BMI:     29.26 Pulse rate:   72 / minute Pulse rhythm:   regular BP sitting:   108 / 68  (left arm)  Vitals Entered By: Milford Cage NCMA (April 20, 2009 3:15 PM)  Physical Exam  General:  Well developed, well nourished, no acute distress. Head:  Normocephalic and atraumatic. Eyes:  PERRLA, no icterus. Lungs:  Clear throughout to auscultation. Heart:  Regular rate and rhythm; no murmurs, rubs,  or bruits. Abdomen:  SOFT, NONTENDER, NO MASS OR HSM,BS+ Rectal:  NOT DONE. Neurologic:  Alert and  oriented x4;  grossly normal neurologically. Psych:  Alert and cooperative. Normal mood and affect.anxious.     Impression & Recommendations:  Problem # 1:  DIARRHEA (ICD-787.91) Assessment New 55 YO MALE WITH CHRONIC GERD WITH DIARRHEA SECONDARY TO SIDE EFFECT OF DEXILANT-NOW IMPROVING  Problem # 2:  GERD (ICD-530.81) Assessment: Deteriorated POORLY CONTROLLED  ON OMEPRAZOLE 40 MG DAILY,INTOLERANT TO DEXILANT,NO RESPONSE WITH ZEGERID  WILL GIVE TRIAL OF NEXIUM 40 MG DAILY IN AM BEFORE BREAKFAST, AND CONTINUE OMEPRAZOLE AT DINNER DAILY (SAMPLES ONLY OF NEXIUM TODAY) REVIEWED ANTI REFLUX REGIMEN,ELEVATION OF HEAD OF BED ETC. PT ASKING TO CHANGE GI MD'S,THEY WILL DISCUSS AND CALL BACK.  DISCUSSED OSTEOPOROSIS-ADVISED CONTINUE CALCIUM AND VIT D ,CONSIDER BONE DENSITYSTUDY PERIODICALLY, MAINTAIN PHYSICAL ACTIVITY, AND EVENTUALLY AIM TO DECREASE TO ONCE DAILY PPI DOSING.  Problem # 3:  GASTROPARESIS (ICD-536.3) Assessment: Unchanged MILD-MODERATE  STABLE ;CONTINUE TO EMPHASIZE DIETARY MANAGEMENT, PT DOES NOT WANT REGLAN.  Patient Instructions: 1)  Use Nexium samples, take 1 capsule 30 min prior to breakfast. 2)  Take the Omeprazole before dinner. 3)  Gastroparesis diet and Anti Reflux measures given. 4)  Copy sent to : [name], MD 5)  The medication list was reviewed and reconciled.  All changed / newly prescribed medications were explained.  A complete medication list was provided to the patient / caregiver.

## 2010-03-01 NOTE — Progress Notes (Signed)
Summary: triage  Phone Note Call from Patient Call back at Home Phone [phone]   Caller: wife, [name] Call For: Dr. Russella Dar Reason for Call: Talk to Nurse Summary of Call: since Monday evening pt has had diarrhea, nausea, abd pain... doesnt feel can wait until next available Initial call taken by: Vallarie Mare,  April 16, 2009 4:11 PM  Follow-up for Phone Call        Left message for patient to call back Darcey Nora RN, [facility]  April 19, 2009 10:55 AM  patient with CP, nausea and diarrhea 1 week hx.  No improvement with robinul .  Patient  will come in and see Zebulin Siegel PA 04-20-09 3:00 Follow-up by: Darcey Nora RN, CGRN,  April 19, 2009 2:31 PM

## 2010-03-01 NOTE — Procedures (Signed)
Summary: EGD   EGD  Procedure date:  01/25/2006  Findings:      Findings: Esophagitis  Findings: Gastritis  Location: [facility]    EGD  Procedure date:  01/25/2006  Findings:      Findings: Esophagitis  Findings: Gastritis  Location: [facility]   Patient Name: Joseph Fuentes, Joseph Fuentes MRN:  Procedure Procedures: Panendoscopy (EGD) CPT: 43235.    with biopsy(s)/brushing(s). CPT: D1846139.  Personnel: Endoscopist: Venita Lick. Russella Dar, MD, Clement[facility] Graham.  Exam Location: Exam performed in Outpatient Clinic. Outpatient  Patient Consent: Procedure, Alternatives, Risks and Benefits discussed, consent obtained, from patient. Consent was obtained by the RN.  Indications Symptoms: Weight loss. Diarrhea. Abdominal pain, location: diffuse.  History  Current Medications: Patient is not currently taking Coumadin.  Pre-Exam Physical: Performed Jan 25, 2006  Cardio-pulmonary exam, HEENT exam, Abdominal exam, Mental status exam WNL.  Comments: Pt. history reviewed/updated, physical exam performed prior to initiation of sedation?Yes Exam Exam Info: Maximum depth of insertion Duodenum, intended Duodenum. Vocal cords not visualized. Gastric retroflexion performed. ASA Classification: II. Tolerance: excellent.  Sedation Meds: Patient assessed and found to be appropriate for moderate (conscious) sedation. Residual sedation present from prior procedure today. Cetacaine Spray 2 sprays given aerosolized. Fentanyl 25 mcg. given IV. Versed 1 mg. given IV.  Monitoring: BP and pulse monitoring done. Oximetry used. Supplemental O2 given  Findings ESOPHAGEAL INFLAMMATION: suspected as a result of infectious esophagitis. Severity is mild, erythema only.  Brushing/Esoph Inflamtn taken. ICD9: Esophagitis, Other: 530.19.  Normal: Mid Esophagus to Distal Esophagus.  MUCOSAL ABNORMALITY: Cardia to Pyloric Sphincter. Erythematous mucosa. Granular mucosa. Biopsy/Mucosal Abn taken. ICD9:  Gastritis, Unspecified: 535.50. Comment: mild.  Normal: Duodenal Bulb to Duodenal 2nd Portion. Biopsy/Normal taken. Comments: R/O celiac disease.   Assessment  Diagnoses: 535.50: Gastritis, Unspecified.  530.19: Esophagitis, Other.   Events  Unplanned Intervention: No unplanned interventions were required.  Unplanned Events: There were no complications. Plans Medication(s): Await pathology. PPI: QAM, for 4 wks.   Patient Education: Patient given standard instructions for: Mucosal Abnormality.  Disposition: After procedure patient sent to recovery. After recovery patient sent home.  Scheduling: Office Visit, to [employer]. Russella Dar, MD, [facility], around Feb 25, 2006.    This report was created from the original endoscopy report, which was reviewed and signed by the above listed endoscopist.    cc; [name], MD

## 2010-03-01 NOTE — Procedures (Signed)
Summary: Colonoscopy   Colonoscopy  Procedure date:  01/25/2006  Findings:      Results: Hemorrhoids.     Location:  [facility].    Procedures Next Due Date:    Colonoscopy: 01/2016 Patient Name: Joseph Fuentes, Joseph Fuentes MRN:  Procedure Procedures: Colonoscopy CPT: [address].    with biopsy. CPT: Q5068410.  Personnel: Endoscopist: Venita Lick. Russella Dar, MD, Clementeen Graham.  Exam Location: Exam performed in Outpatient Clinic. Outpatient  Patient Consent: Procedure, Alternatives, Risks and Benefits discussed, consent obtained, from patient. Consent was obtained by the RN.  Indications Symptoms: Diarrhea Hematochezia. Weight Loss.  History  Current Medications: Patient is not currently taking Coumadin.  Pre-Exam Physical: Performed Jan 25, 2006. Cardio-pulmonary exam, Rectal exam, HEENT exam , Abdominal exam, Mental status exam WNL.  Comments: Pt. history reviewed/updated, physical exam performed prior to initiation of sedation?Yes Exam Exam: Extent of exam reached: Terminal Ileum, extent intended: Terminal Ileum.  The cecum was identified by appendiceal orifice and IC valve. Time to Cecum: 00:02:18. Time for Withdrawl: 00:10:02. Colon retroflexion performed. ASA Classification: II. Tolerance: excellent.  Monitoring: Pulse and BP monitoring, Oximetry used. Supplemental O2 given.  Colon Prep Used MoviPrep for colon prep. Prep results: excellent.  Sedation Meds: Patient assessed and found to be appropriate for moderate (conscious) sedation. Fentanyl 75 mcg. given IV. Versed 9 mg. given IV.  Findings NORMAL EXAM: Terminal Ileum to Sigmoid Colon. Biopsy/Normal Exam taken. Comments: random biopsies taken.  HEMORRHOIDS: Internal. Size: Small. Not bleeding. Not thrombosed. ICD9: Hemorrhoids, Internal: 455.0.   Assessment  Diagnoses: 455.0: Hemorrhoids, Internal.   Events  Unplanned Interventions: No intervention was required.  Unplanned Events: There were no  complications. Plans  Post Exam Instructions: Post sedation instructions given.  Medication Plan: Await pathology. Antispasmodics: Robinul forte BID prn,  Hemorrhoidal Medications: Canasa supp HS prn,   Patient Education: Patient given standard instructions for: Hemorrhoids.  IBS.  Disposition: After procedure patient sent remain in endo suite for second procedure.  Scheduling/Referral: Colonoscopy, to [facility] T. Russella Dar, MD, [facility], around Jan 26, 2016.    This report was created from the original endoscopy report, which was reviewed and signed by the above listed endoscopist.    cc: [name], MD

## 2010-03-01 NOTE — Progress Notes (Signed)
Summary: meds not working  Phone Note Call from Patient Call back at [employer] [phone]   Caller: spouse [name] Call For: Kamari Buch Reason for Call: Talk to Nurse Summary of Call: Patient states that meds given to him are not working wants to know if he can up the dosage (Nexium) Initial call taken by: Tawni Levy,  April 27, 2009 4:17 PM  Follow-up for Phone Call        The morning Nexium helps but around 4 PM he starts to really have a problem with reflux. HE asked if he couuld try twice daily.   I advised him to take another  Nexium either 30 min prior to dinner or before bedtime.  I went over relux measures and mentioned elevating head of bed or sleeping  on several pillows.  I will let some more samples up front. We want to see how the twice daily does for him.  Follow-up by: Joselyn Glassman,  April 28, 2009 9:40 AM

## 2010-03-01 NOTE — Progress Notes (Signed)
Summary: What is the next step  Phone Note Call from Patient Call back at Home Phone [phone]   Caller: Joseph Fuentes after 11am [phone] Call For: Joseph Gip, PA Summary of Call: Wants to discuss what is the next step. Wants to talk to Joseph Fuentes directly. Initial call taken by: Leanor Kail [facility],  July 12, 2009 4:30 PM  Follow-up for Phone Call        The pt said he ran out of the Nexium and it didn't help.  He then has been taking Dexilant every other day and omeprazole every other day alternating days.  This hasn't really helped either.   He also wants to change from Dr. Russella Dar to Dr. Marina Goodell.  He wants to know what the next step is. I transferred this note to [facility] PA but didn't hear anything before she went out of town. Follow-up by: Joselyn Glassman,  July 16, 2009 5:00 PM  Additional Follow-up for Phone Call Additional follow up Details #1::        The pt wants to change from Dr. Russella Dar to Dr. Marina Goodell.  I asked him why and he said he just felt there was a slight personality conflict between himself and Dr. Russella Dar.        Additional Follow-up for Phone Call Additional follow up Details #2::    OK with me to switch MDs Consider starting Domperidone for gastroparesis Follow-up by: Meryl Dare MD FACG,  July 20, 2009 4:22 PM  Additional Follow-up for Phone Call Additional follow up Details #3:: Details for Additional Follow-up Action Taken: Dr Marina Goodell, This patient wants to switch from Dr. Russella Dar to you. Let me know what you think. Koi Zangara,PA.  Sorry, but I can't accommodate "new" patients w/ established GI docs at this time. Hilarie Fredrickson MD  July 20, 2009 8:37 PM   ---- 07/26/2009 4:23 PM, Keanen Dohse Oswald Hillock PA-c wrote: regarding Joseph Fuentes-Oct 07, 2055- please call pt ,let them know I was out of town,as was dr.perry, Russella Dar.... pt wanted to change GI doc's from [facility] to [facility]- Dr. Marina Goodell declines-no openings.Marland KitchenMarland KitchenMarland KitchenHe needs a f/u with Russella Dar or maybe offer to Dr. Leone Payor. He needs to  see an MD next-nothing helping.  I have left a message for patient to return my call on both his cell number and home number. Dottie Nelson-Smith CMA Duncan Dull)  July 26, 2009 4:37 PM   Patient's wife, Joseph Fuentes advised that Dr Marina Goodell declines to see patient. She states her husbands other choice was Dr Christella Hartigan. Dr Christella Hartigan, would you be willing to take patient? Dottie Nelson-Smith CMA Duncan Dull)  July 26, 2009 4:41 PM     no thank you. Joseph Fee MD  July 27, 2009 7:40 AM   Appended Document: What is the next step Dr Leone Payor will you accept this patient?  Appended Document: What is the next step not able to accomodate this request at this time  Appended Document: What is the next step I think he needs a medication for gastroparesis and I will defer to his new gastroenterologist as he requested to change from me.   Appended Document: What is the next step I have notified the patient's wife that [facility] is unable to accomodate him and he should find another GI practice.

## 2010-06-14 NOTE — Procedures (Signed)
NAME:  Joseph Fuentes, Joseph Fuentes                ACCOUNT NO.:  [id number]   MEDICAL RECORD NO.:  [id number]          PATIENT TYPE:  OUT   LOCATION:  SLEEP CENTER                 [facility]ACILITY[facility]  [facility]   PHYSICIAN:  Clinton D. Maple Hudson, MD, FCCP, FACPDATE OF BIRTH:  03-24-[birth year]   DATE OF STUDY:  07/17/2008                            NOCTURNAL POLYSOMNOGRAM   REFERRING PHYSICIAN:  Antony Contras, MD   REFERRING PHYSICIAN:  Antony Contras, MD   INDICATION FOR STUDY:  Hypersomnia with sleep apnea.   EPWORTH SLEEPINESS SCORE:  Epworth sleepiness score 4/24.  BMI 28.3.  Weight 209 pounds, height 72 inches.  Neck 17 inches.   HOME MEDICATIONS:  Charted and reviewed.   A baseline diagnostic NPSG on Jun 08, 2000, recorded an RDI of 23 per  hour.  CPAP titration is now requested.   SLEEP ARCHITECTURE:  Total sleep time 314 minutes with sleep efficiency  87%.  Stage I was 4.8%, stage II 70.9%, stage III 0.2%, REM 24.2% of  total sleep time.  Sleep latency 12.5 minutes, REM latency 91.5 minutes,  awake after sleep onset 34 minutes, arousal index 18.2.   BEDTIME MEDICATION:  Fluticasone nasal spray and Ambien.   RESPIRATORY DATA:  CPAP titration protocol.  CPAP was titrated to 10  CWP, AHI 0 per hour.  He wore a medium ResMed Mirage Quattro mask with  heated humidifier.   OXYGEN DATA:  Snoring was prevented by CPAP and mean oxygen saturation  held at 96.5% on room air.   CARDIAC DATA:  Normal sinus rhythm with occasional PVC.   MOVEMENT/PARASOMNIA:  Occasional limb jerk, insignificant.  Bathroom x1.   IMPRESSION/RECOMMENDATIONS:  1. Successful CPAP titration to 10 CWP, AHI 0 per hour.  He wore a      medium ResMed Mirage Quattro mask      with heated humidifier.  2. Baseline diagnostic NPSG on Jun 08, 2000, had recorded an RDI of 23      per hour.      Clinton D. Maple Hudson, MD, [facility], FACP  Diplomate, Biomedical engineer of Sleep Medicine  Electronically Signed     CDY/MEDQ  D:  07/19/2008 10:02:48  T:   07/19/2008 22:03:11  Job:  [id number]

## 2010-06-14 NOTE — Op Note (Signed)
Joseph Fuentes, Joseph Fuentes                ACCOUNT NO.:  [id number]   MEDICAL RECORD NO.:  [id number]          PATIENT TYPE:  AMB   LOCATION:  DSC                          FACILITY:  [facility]   PHYSICIAN:  Suzanna Obey, M.D.       DATE OF BIRTH:  17-Jun-1955   DATE OF PROCEDURE:  11/08/2006  DATE OF DISCHARGE:                               OPERATIVE REPORT   PREOPERATIVE DIAGNOSIS:  Deviated septum and turbinate hypertrophy.   POSTOPERATIVE DIAGNOSIS:  Deviated septum and turbinate hypertrophy.   SURGICAL PROCEDURE:  Septoplasty and submucous resection of inferior  turbinates.   ANESTHESIA:  General.   ESTIMATED BLOOD LOSS:  Less than 5 mL.   INDICATIONS:  This is a 55 year old with chronic nasal obstruction and  congestion that failed medical therapy.  He was informed of the risks  and benefits of the procedure and options were discussed.  The procedure  was discussed.  All questions were answered and consent was obtained.   OPERATION:  The patient was taken to the operating room and placed in  the supine position.  After adequate general mask LMA anesthesia, was  placed in a supine position, prepped and draped in the usual sterile  manner.  The oxymetazoline pledgets were placed into the nose  bilaterally and the septum and inferior turbinates were injected with 1%  lidocaine with 1:100,000 epinephrine.  A left hemitransfixion incision  was performed, raising the mucoperichondrial and ostial flap.  The  cartilage was divided about 2 cm posterior to the caudal strut and the  deviated portion of the cartilage was removed.  The Jansen-Middleton  forceps were used to remove the deviate bone.  This corrected the septal  deflection.  The turbinates were infractured.  A midline incision made  with a 15 blade.  Mucosal flap elevated superiorly and the inferior  mucosa and bone were removed with the turbinate scissors.  The edge was  cauterized with the suction cautery.  Both flaps laid down back  down  over the raw surface and outfractured.  Hemitransfixion incision closed  with interrupted 4-0 chromic and the septum was positioned in midline  and sutures with the 4-0 plain gut.  The plain gut was placed through  the septum in a mattress fashion stitch.  Telfa roll soaked in  bacitracin placed into the nose bilaterally and secured with a 3-0  nylon.  Nasopharynx was suctioned out of all blood and debris prior to  that.  The patient was awakened and brought to recovery in stable  condition, counts correct.           ______________________________  Suzanna Obey, M.D.     JB/MEDQ  D:  11/08/2006  T:  11/08/2006  Job:  [id number]

## 2010-06-17 NOTE — Assessment & Plan Note (Signed)
[facility]                           GASTROENTEROLOGY OFFICE NOTE   Joseph, Fuentes                         MRN:          [id number]  DATE:11/28/2005                            DOB:          1955/02/14    CHIEF COMPLAINT:  A 55 year old white male self referred for abdominal pain  and diarrhea.   HISTORY OF PRESENT ILLNESS:  Joseph Fuentes was diagnosed with celiac disease at  age 60. He does not recall how the diagnosis was established but he  maintained a strict gluten-free diet while he lived at home with his mother.  After leaving home, he states he has really not followed the gluten-free  diet at all except on rare occasions. He was started on Avodart and Topamax  a few months ago and noted the onset of severe postprandial abdominal  cramping and watery dark-colored diarrhea with occasional tarry but not  black stools and occasional small amounts of bright red blood per rectum. He  started closely following a gluten-free diet and his symptoms did improve  somewhat. He saw Dr. [name] who prescribed a course of metronidazole given a  history of being in [place] earlier this year on his honeymoon along with  Levsin sublingually before meals and his symptoms have further improved.  They however still persist. He notes about a 10-pound weight loss over the  past few months. He underwent evaluation for hematuria by Dr. Patsi Sears  which included an abdominal CT scan of the abdomen and pelvis and both were  unremarkable. A cystoscopy showed a small blood clot along the prostate and  this was cleared. The patient's daughter has celiac disease, no other family  members with celiac diseases, no history of colon cancer, colon polyps or  inflammatory bowel disease.   PAST MEDICAL HISTORY:  Hyperlipidemia, hypothyroidism, celiac disease,  migraine headaches, sleep apnea, anxiety, rheumatic fever in 1964, status  post vasectomy.   CURRENT MEDICATIONS:  Listed  in the chart, updated and reviewed.   MEDICATION ALLERGIES:  PENICILLIN.   SOCIAL HISTORY AND REVIEW OF SYSTEMS:  See the handwritten form.   PHYSICAL EXAMINATION:  GENERAL:  Anxious white male in no apparent distress.  VITAL SIGNS:  Height 6 feet, weight 200 pounds. Blood pressure is 118/64,  pulse 68 and regular.  HEENT:  Anicteric sclera, oropharynx clear.  CHEST:  Clear to auscultation bilaterally.  CARDIAC:  Regular rate and rhythm without murmurs appreciated.  ABDOMEN:  Soft with mild diffuse tenderness to deep palpation, no  distention. Normal active bowel sounds. No palpable organomegaly, masses, or  hernias.  RECTAL:  Deferred at time of colonoscopy.  EXTREMITIES:  Without cyanosis, clubbing or edema.  NEUROLOGIC:  Alert and oriented x3. Grossly nonfocal.   ASSESSMENT/PLAN:  1. Postprandial diarrhea with hematochezia and weight loss.  2. History of celiac disease with long-term dietary noncompliance. I have      given him the names of 2 books on celiac disease plus information on      links to several celiac disease web sites as well as a brochure on  celiac disease. He is to complete his course of metronidazole, maintain      a gluten-free diet and continue Levsin 2 sublingually a.c. Obtain a      CMET, CBC, lipase, erythrocyte sedimentation rate, TSH, tissue      transglutaminase and IgA today. The risks, benefits, and alternatives      to colonoscopy with possible biopsy and possible polypectomy and upper      endoscopy with possible biopsy discussed with the patient and he      consents to proceed. This will be scheduled electively.     Venita Lick. Russella Dar, MD, [facility]    MTS/MedQ  DD: 11/28/2005  DT: 11/29/2005  Job #: [id number]   cc:   Molly Maduro A. [name], M.D.

## 2010-06-17 NOTE — Op Note (Signed)
NAME:  Joseph Fuentes, Joseph Fuentes                ACCOUNT NO.:  [id number]   MEDICAL RECORD NO.:  [id number]          PATIENT TYPE:  AMB   LOCATION:  [facility]                         FACILITY:  [facility]   PHYSICIAN:  Sigmund I. Patsi Sears, M.D.DATE OF BIRTH:  March 20, 1955   DATE OF PROCEDURE:  09/08/2005  DATE OF DISCHARGE:                                 OPERATIVE REPORT   PREOPERATIVE DIAGNOSIS:  Gross hematuria.   POSTOPERATIVE DIAGNOSIS:  Prostatic of bleeding.   OPERATIONS:  Cystourethroscopy, clot evacuation.   SURGEON:  Sigmund I. Patsi Sears, M.D.   ANESTHESIA:  General LMA.   PREPARATION:  After appropriate preanesthesia, the patient was brought to  the operating room and placed upon the operating room in dorsal supine  position where general LMA anesthesia was induced.  He was then placed in a  dorsal lithotomy position where the pubis was prepped with Betadine solution  and draped in usual fashion.   REVIEW OF HISTORY:  This 55 year old male has a history of:  1. Celiac disease.  2. Migraines.  3. Hypothyroidism.  4. Elevated cholesterol.  5. Erectile function.  6. Anxiety, severe.  7. Hematuria.  8. Sleep apnea.   The patient is status post office evaluation which showed a normal CT.  He,  however, refuses office cystoscopy and is now for cystoscopy under  anesthesia.   PROCEDURE:  Cystourethroscopy was accomplished.  It showed blood in the  right prostatic fossa.  The clot was identified on the right lateral wall,  and was blood emitted from the prostate itself.  It was dissected from the  wall of the prostate and removed.   _______________ was noted of the prostate itself.   The remaining cystoscopy showed a normal trigone, with clear reflux of both  orifices.  There was no evidence of bladder stone, tumor or diverticular  formation.   The bladder was drained of fluid, and the patient had been given IV Toradol,  as well as a B&O suppository.  He was awakened and taken to  recovery room in  good condition.      Sigmund I. Patsi Sears, M.D.  Electronically Signed     SIT/MEDQ  D:  09/08/2005  T:  09/08/2005  Job:  [id number]

## 2010-06-17 NOTE — Assessment & Plan Note (Signed)
[facility]                         GASTROENTEROLOGY OFFICE NOTE   Joseph Fuentes, Joseph Fuentes                         MRN:          [id number]  DATE:03/06/2006                            DOB:          [birth year]    This is a return office visit for GERD, gastritis, and irritable bowel  syndrome.  There is no evidence of celiac disease by serology testing  and duodenal biopsies.  He has been eating gluten in his diet for  several weeks now and notes no change in his symptoms of mild  postprandial diarrhea.  He does feel that the course of antibiotics  significantly helped his symptoms and his long-term abdominal pain and  bloating is improved with the use of Robinul and Levsin.   CURRENT MEDICATIONS:  Listed on the chart, updated, and reviewed.   MEDICATION ALLERGIES:  PENICILLIN.   EXAM:  No acute distress.  Weight 197.  Blood pressure 100/62.  Pulse 80 and regular.  CHEST:  Clear to auscultation bilaterally.  CARDIAC:  Regular rate and rhythm without murmurs appreciated.  ABDOMEN:  Soft, nontender with normoactive bowel sounds.   ASSESSMENT AND PLAN:  1. Presumed irritable bowel syndrome.  Long-term high-fiber diet with      adequate fluid intake.  May use Robinul Forte 1 b.i.d. p.r.n. and      Levsin 1-2 sublingually q.i.d. p.r.n. There is no evidence of      celiac disease.  Ongoing followup with Dr. [name].  I will see back      on referral.  2. Candida esophagitis - resolved.  3. Gastroesophageal reflux disease.  Continue standard antireflux      measures and omeprazole 20 mg p.o. q.a.m.  I will see back on      referral.     Venita Lick. Russella Dar, MD, [facility]  Electronically Signed    MTS/MedQ  DD: 03/06/2006  DT: 03/06/2006  Job #: [id number]   cc:   Molly Maduro A. [name], M.D.

## 2011-08-14 ENCOUNTER — Ambulatory Visit (INDEPENDENT_AMBULATORY_CARE_PROVIDER_SITE_OTHER): Payer: Self-pay | Admitting: Sports Medicine

## 2011-08-14 ENCOUNTER — Encounter: Payer: Self-pay | Admitting: Sports Medicine

## 2011-08-14 VITALS — BP 126/72 | HR 72 | Ht 72.0 in | Wt 235.0 lb

## 2011-08-14 DIAGNOSIS — M25529 Pain in unspecified elbow: Secondary | ICD-10-CM

## 2011-08-14 DIAGNOSIS — M7710 Lateral epicondylitis, unspecified elbow: Secondary | ICD-10-CM

## 2011-08-14 NOTE — Progress Notes (Signed)
  Subjective:    Patient ID: Joseph Fuentes, male    DOB: [birth year], 56 y.o.   MRN: [id number]  HPI  pleasant 56 year old male comes in today complaining of 3-4 weeks of right elbow pain. He has a history of lateral epicondylitis which was treated with a cortisone injection one year ago by myself. This resulted in complete resolution of his symptoms. He's been playing quite a bit of golf and as a result his pain has returned. It is identical in nature to what he experienced last year. He experiences an aching discomfort along the lateral epicondyle. Some swelling. No associated numbness or tingling. Symptoms improve at rest. He's been using both the counterforce brace as well as topical Pennsaid. He is also been doing his home exercises including decentered strengthening. No recent trauma.    Review of Systems     Objective:   Physical Exam  Well-developed and well-nourished in no acute distress. Examination of the right elbow shows mild soft tissue swelling over the lateral epicondyles but full range of motion. No effusion. He is tender to palpation over the lateral epicondyle, no pain medially. Reproducible pain with resisted ECRB testing. No pain over the radial tunnel. Decreased grip strength secondary to pain. Good radial and ulnar pulses, sensation is intact to light-touch distally.  MSK ultrasound of the right elbow shows an area of hypoechogenicity at the origin of the ECRB tendon. No discrete tear is seen. Doppler flow does not demonstrate any neovascularity. Small spur is seen off of the lateral epicondyle. Joint space appears normal.      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 This was accomplished under sterile technique and the patient tolerated this without any difficulty. He'll continue with his counterforce  brace and home exercise program. He can also continue with topical anti-inflammatories. I suspect his lateral epicondylitis as a result either of his golf 4 or possibly his grip. I recommended that he have this evaluated further. He'll followup with me when necessary.

## 2011-09-11 ENCOUNTER — Other Ambulatory Visit: Payer: Self-pay | Admitting: Family Medicine

## 2012-01-15 ENCOUNTER — Telehealth: Payer: Self-pay | Admitting: *Deleted

## 2012-01-15 ENCOUNTER — Ambulatory Visit (INDEPENDENT_AMBULATORY_CARE_PROVIDER_SITE_OTHER): Payer: 59 | Admitting: Sports Medicine

## 2012-01-15 ENCOUNTER — Encounter: Payer: Self-pay | Admitting: Sports Medicine

## 2012-01-15 VITALS — BP 117/82 | HR 72 | Ht 72.0 in | Wt 235.0 lb

## 2012-01-15 DIAGNOSIS — M7710 Lateral epicondylitis, unspecified elbow: Secondary | ICD-10-CM

## 2012-01-15 MED ORDER — METHOCARBAMOL 750 MG PO TABS: 750.0000 mg | ORAL_TABLET | Freq: Three times a day (TID) | ORAL | Status: DC | PRN

## 2012-01-15 MED ORDER — PREDNISONE (PAK) 10 MG PO TABS: ORAL_TABLET | ORAL | Status: DC

## 2012-01-15 NOTE — Progress Notes (Signed)
  Subjective:    Patient ID: Joseph Fuentes, male    DOB: [birth year], 56 y.o.   MRN: [id number]  HPI  Patient comes in today with returning right elbow pain. He has a history of lateral epicondylitis in the same elbow. He has received 2 cortisone injections. Last injection was in July. He was pain-free until 3 weeks ago. Pain has returned. It is identical in nature to what he experienced previously. Pain is all along the lateral elbow. Worse with wrist related motion. He is in school and notices pain with using the computer, particularly the mouse. No numbness or tingling. He is also requesting a refill on his methocarbamol. He has a history of low back pain and takes his medication on a when necessary basis. Last refill was over a year ago.   Review of Systems     Objective:   Physical Exam Well-developed, well-nourished. No acute distress. Awake alert and oriented x3  Right elbow: Full motion. No effusion. He is exquisitely tender to palpation of the lateral epicondyles with reproducible pain with resisted ECRB testing. No tenderness over the medial epicondyle. No pain over the radial tunnel. Neurovascularly intact distally.  MSK ultrasound of the right elbow: Limited study of the common extensor tendon at the insertion onto the lateral epicondyle was performed. When compared to previous images, there is still persistent hypoechogenicity at the insertion, but I do not appreciate any discrete tear. There is some calcification, consistent with bone spurs, off of the lateral epicondyle. There is not a significant amount of neovascularization on today's scan.       Assessment & Plan:  1. Returning right elbow pain secondary to lateral epicondylitis 2. Low back pain 3. History of migrainee  I've explained to the patient that it may be detrimental to continue to inject his lateral epicondyle with cortisone. Instead, I have recommended a cockup wrist brace to be worn continuously for the next  week or so. He'll take naproxen sodium 500 mg twice a day for 5 days then when necessary. He is cautioned about GI upset. He has this medication at home. He will call me towards the end of the week. His history of migraines precludes [place] from trying topical nitroglycerin. I explained to him that there is a possibility of further diagnostic imaging, in the form of an MRI, if his symptoms persist. This would be for presurgical planning. He will resume his eccentric exercises. I've also agreed to refill his methocarbamol 750 mg Q8 hours when necessary for low back pain.

## 2012-01-15 NOTE — Telephone Encounter (Signed)
Message copied by Mora Bellman on Mon Jan 15, 2012  2:55 PM ------      Message from: Reino Bellis R      Created: Mon Jan 15, 2012  2:46 PM      Regarding: FW: One question about elbow treatment      Contact: [phone]       Rivaldo had another question about his elbow. I saw him earlier today with a diagnosis of lateral epicondylitis. Unfortunately, his golf trip this weekend has been paid for so he is obligated to go. I've agreed to call in a 6 day Sterapred Dosepak for him to take with him. He will fill this medication prior to leaving but will take it only if his elbow is symptomatic. He understands that he is to take it to completion if he starts it. He is to use no other anti-inflammatories. I would also have him ice his elbow after he plays.            ----- Message -----         From: Claiborne Billings         Sent: 01/15/2012  12:37 PM           To: Ralene Cork, DO      Subject: One question about elbow treatment                       Had one more question about his elbow treatment. Please call.

## 2012-01-15 NOTE — Telephone Encounter (Signed)
Prednisone rx sent as an addendum to today's visit.

## 2012-01-15 NOTE — Addendum Note (Signed)
Addended by: Jacki Cones C on: 01/15/2012 02:55 PM   Modules accepted: Orders

## 2012-01-18 ENCOUNTER — Other Ambulatory Visit: Payer: Self-pay | Admitting: *Deleted

## 2012-01-18 DIAGNOSIS — M25521 Pain in right elbow: Secondary | ICD-10-CM

## 2012-01-22 ENCOUNTER — Other Ambulatory Visit: Payer: 59

## 2012-01-25 ENCOUNTER — Ambulatory Visit
Admission: RE | Admit: 2012-01-25 | Discharge: 2012-01-25 | Disposition: A | Discharge status: Home / Self Care | Payer: 59 | Source: Ambulatory Visit | Attending: Sports Medicine | Admitting: Sports Medicine

## 2012-01-25 ENCOUNTER — Other Ambulatory Visit: Payer: Self-pay | Admitting: *Deleted

## 2012-01-25 ENCOUNTER — Other Ambulatory Visit: Payer: 59

## 2012-01-25 DIAGNOSIS — M25521 Pain in right elbow: Secondary | ICD-10-CM

## 2012-01-28 ENCOUNTER — Ambulatory Visit
Admission: RE | Admit: 2012-01-28 | Discharge: 2012-01-28 | Disposition: A | Discharge status: Home / Self Care | Payer: 59 | Source: Ambulatory Visit | Attending: Sports Medicine | Admitting: Sports Medicine

## 2012-01-28 DIAGNOSIS — M25521 Pain in right elbow: Secondary | ICD-10-CM

## 2012-02-07 ENCOUNTER — Telehealth: Payer: Self-pay | Admitting: Sports Medicine

## 2012-02-07 NOTE — Telephone Encounter (Signed)
I spoke with Joseph Fuentes on the phone today regarding MRI findings of his elbow. He has findings consistent with an extensive partial tear of the common extensor tendon at the lateral epicondyle. He has failed conservative treatment to date. He is not a candidate for topical nitroglycerin given his history of migraines. My recommendation is for a referral to Dr. Richardson Landry to discuss merits of surgical debridement versus repair. Juquan is currently in school and taking 18 hours. He has also had a change of insurance. He would like to check with his new insurance company before proceeding. He will call me back when he is ready for me to make the referral. In the meantime, I recommended that he continue with his eccentric exercises.

## 2012-02-21 ENCOUNTER — Telehealth: Payer: Self-pay | Admitting: *Deleted

## 2012-02-21 NOTE — Telephone Encounter (Signed)
Referral made 

## 2012-02-21 NOTE — Telephone Encounter (Signed)
Message copied by Mora Bellman on Wed Feb 21, 2012 11:44 AM ------      Message from: Ralene Cork      Created: Tue Feb 20, 2012  4:06 PM      Regarding: FW: phone message      Contact: [phone]       Please refer to Dr Eulah Pont for ECRB tendon tear..See previous notes.            ----- Message -----         From: Mora Bellman, RN         Sent: 02/20/2012   3:44 PM           To: Ralene Cork, DO      Subject: FW: phone message                                                    ----- Message -----         From: Lizbeth Bark         Sent: 02/20/2012   3:27 PM           To: Mora Bellman, RN      Subject: phone message                                            Pt called states he was told by Dr. Margaretha Sheffield he needed to get a procedure done on his elbow but needed to get his insurance information first. He now has his insurance in place and  has questions on the procedure and needs a referral.  Cell [phone]

## 2012-11-14 ENCOUNTER — Encounter: Payer: Self-pay | Admitting: Sports Medicine

## 2012-11-14 ENCOUNTER — Ambulatory Visit (INDEPENDENT_AMBULATORY_CARE_PROVIDER_SITE_OTHER): Payer: BC Managed Care – PPO | Admitting: Sports Medicine

## 2012-11-14 VITALS — BP 128/81 | Ht 72.0 in | Wt 229.0 lb

## 2012-11-14 DIAGNOSIS — M25569 Pain in unspecified knee: Secondary | ICD-10-CM

## 2012-11-14 DIAGNOSIS — M25562 Pain in left knee: Secondary | ICD-10-CM

## 2012-11-14 MED ORDER — METHYLPREDNISOLONE ACETATE 40 MG/ML IJ SUSP
40.0000 mg | Freq: Once | INTRAMUSCULAR | Status: AC
  Administered 2012-11-14: 40 mg via INTRA_ARTICULAR

## 2012-11-15 ENCOUNTER — Telehealth: Payer: Self-pay | Admitting: *Deleted

## 2012-11-15 NOTE — Telephone Encounter (Signed)
Informed pt orthotics had arrived, and transferred to schedulers to set up appt to see Dr Irving Shows.

## 2012-11-15 NOTE — Progress Notes (Signed)
  Subjective:    Patient ID: Joseph Fuentes, male    DOB: Apr 18, 1955, 56 y.o.   MRN: [id number]  HPI chief complaint: Left knee pain  Patient comes in today complaining of 1 month of left knee pain. No trauma that he can recall but rather sudden onset of pain that he localizes to the lateral left knee. He describes a sharp stabbing discomfort that has been associated with intermittent swelling and stiffness. He had similar symptoms 2 years ago and a cortisone injection was administered which helped tremendously. No feelings of instability. No fevers or chills. Since his last office visit he has undergone right elbow ECRB tendon debridement/repair. Surgery was performed by Dr. Eulah Pont and patient is doing very well    Review of Systems     Objective:   Physical Exam Well-developed, well-nourished. No acute distress. Awake alert and oriented x3. Vital signs are reviewed.  Left knee: Full range of motion. Trace effusion. Minimal patellofemoral crepitus. There is tenderness to palpation along the lateral joint line and pain but no popping with McMurray's maneuver. Mild a positive Thessaly's. No tenderness to palpation along the medial joint line. Knee is stable to ligamentous exam. No popliteal cyst. Neurovascularly intact distally. Walking without significant limp.      Assessment & Plan:  Left knee pain likely secondary to lateral meniscal tear  I recommended a cortisone injection. An anterior lateral approach was utilized. Patient tolerated this without difficulty. Home exercise program to consist of quad and hamstring strengthening exercises. Body helix sleeve to wear with activity. Followup in 4 weeks. If symptoms persist consider imaging in the form of x-rays and potentially MRI. Call with questions or concerns in the interim.  Consent obtained and verified. Time-out conducted. Noted no overlying erythema, induration, or other signs of local infection. Skin prepped in a sterile  fashion. Topical analgesic spray: Ethyl chloride. Joint: left knee Needle: 22g 1.5 inch Completed without difficulty. Meds: 3cc 1% xylocaine followed by 3cc Marcaine and 1cc (40mg ) depomedrol  Advised to call if fevers/chills, erythema, induration, drainage, or persistent bleeding.

## 2012-11-15 NOTE — Telephone Encounter (Signed)
Pt would like to know the status of orthotic.

## 2012-11-22 ENCOUNTER — Encounter: Payer: Self-pay | Admitting: Podiatrist

## 2012-11-22 ENCOUNTER — Ambulatory Visit (INDEPENDENT_AMBULATORY_CARE_PROVIDER_SITE_OTHER): Payer: BC Managed Care – PPO | Admitting: Podiatrist

## 2012-11-22 VITALS — BP 124/70 | Ht 72.0 in | Wt 225.0 lb

## 2012-11-22 DIAGNOSIS — M7150 Other bursitis, not elsewhere classified, unspecified site: Secondary | ICD-10-CM

## 2012-11-22 NOTE — Patient Instructions (Signed)

## 2012-11-22 NOTE — Progress Notes (Signed)
Dispensed orthotics

## 2012-11-28 ENCOUNTER — Telehealth: Payer: Self-pay | Admitting: *Deleted

## 2012-11-28 DIAGNOSIS — M25562 Pain in left knee: Secondary | ICD-10-CM

## 2012-11-28 NOTE — Telephone Encounter (Signed)
Message copied by Mora Bellman on Thu Nov 28, 2012 12:25 PM ------      Message from: CERESI, MELANIE L      Created: Thu Nov 28, 2012 11:07 AM      Regarding: phone messag      Contact: [phone]       Pt states he is having swelling and pain on left knee.  The injection only helped for a short time.  Does he need an xray? ------

## 2012-11-28 NOTE — Telephone Encounter (Signed)
X-ray ordered per Dr. Margaretha Sheffield.  Advised pt that Dr. Margaretha Sheffield will call him with the results.

## 2012-11-29 ENCOUNTER — Ambulatory Visit
Admission: RE | Admit: 2012-11-29 | Discharge: 2012-11-29 | Disposition: A | Discharge status: Home / Self Care | Payer: BC Managed Care – PPO | Source: Ambulatory Visit | Attending: Sports Medicine | Admitting: Sports Medicine

## 2012-11-29 DIAGNOSIS — M25562 Pain in left knee: Secondary | ICD-10-CM

## 2012-12-02 ENCOUNTER — Encounter: Payer: Self-pay | Admitting: Sports Medicine

## 2012-12-02 ENCOUNTER — Ambulatory Visit (INDEPENDENT_AMBULATORY_CARE_PROVIDER_SITE_OTHER): Payer: BC Managed Care – PPO | Admitting: Sports Medicine

## 2012-12-02 VITALS — BP 118/77 | HR 76 | Ht 72.0 in | Wt 225.0 lb

## 2012-12-02 DIAGNOSIS — M25562 Pain in left knee: Secondary | ICD-10-CM

## 2012-12-02 DIAGNOSIS — M25569 Pain in unspecified knee: Secondary | ICD-10-CM

## 2012-12-02 MED ORDER — METHYLPREDNISOLONE ACETATE 40 MG/ML IJ SUSP
40.0000 mg | Freq: Once | INTRAMUSCULAR | Status: AC
  Administered 2012-12-02: 40 mg via INTRA_ARTICULAR

## 2012-12-02 NOTE — Progress Notes (Signed)
  Subjective:    Patient ID: Joseph Fuentes, male    DOB: 1955/09/13, 57 y.o.   MRN: [id number]  HPI Joseph Fuentes comes in today with persistent left knee pain. Cortisone injection administered into this knee back on October 16 seem to be helpful until he started playing golf again. The twisting motion of golf has aggravated his knee. He has been experiencing increasing pain and swelling. No locking or catching. He is also an active bowler and does not note any real discomfort with this specific activity. His body helix sleeve is a little uncomfortable and he is wondering whether or not it is too small. He is beginning to experience some mild pain in his calf as well. I spoke with him on the phone last week after reviewing x-rays of his left knee and I recommended that he come in today to discuss further treatment options.   Review of Systems     Objective:   Physical Exam Well-developed, well-nourished. No acute distress. Vital signs are reviewed  Left knee: Range of motion is 0-120. Trace to 1+ effusion. Still palpable tenderness along the lateral joint line with pain but no popping with McMurray's. No tenderness along the medial joint line. No popliteal cyst. Knee remains stable to ligamentous exam. Calf is supple with no swelling. Mild tenderness along the proximal calf with direct palpation but a negative Homans. Neurovascularly intact distally. Walking with a slightly.  X-rays of the left knee including AP, lateral, and sunrise views are reviewed. Films are dated 11/29/2012. He has some mild degenerative changes in the patellofemoral joint as well as in the lateral compartment but nothing marked. Nothing acute. Trace effusion is also noted.       Assessment & Plan:  Persistent left knee pain likely secondary to lateral meniscal tear  I discussed the patient's treatment options including aspiration and repeat injection versus further diagnostic imaging in the form of an MRI scan. Patient would  like to try the aspiration and injection before undergoing the MRI. Therefore, the patient's left knee was aspirated and injected in the office today. After sterile preparation, 3 ccs of Xylocaine were used to anesthetize the superior lateral knee. Afterwards, 25 cc of clear synovial fluid was aspirated followed by injection of 3 cc of Marcaine and 1 cc of Depo-Medrol (40mg ). Patient tolerated this without difficulty. He does note that injection and aspiration were carried out after risks and benefits were explained and consent was obtained. I think he is okay to increase his bowling as long as he is not having significant swelling or pain. She knee with home exercise program and continue with compression with activity as well as for one hour afterwards. If patient continues to experience symptoms despite today's injection and aspiration then we will need to reconsider the merits of an MRI scan. Otherwise, followup when necessary.

## 2012-12-05 ENCOUNTER — Other Ambulatory Visit: Payer: Self-pay

## 2012-12-16 ENCOUNTER — Ambulatory Visit: Payer: BC Managed Care – PPO | Admitting: Sports Medicine

## 2012-12-20 ENCOUNTER — Ambulatory Visit: Payer: BC Managed Care – PPO | Admitting: Podiatrist

## 2013-02-10 ENCOUNTER — Other Ambulatory Visit: Payer: Self-pay | Admitting: Endodontics

## 2013-03-06 ENCOUNTER — Ambulatory Visit (INDEPENDENT_AMBULATORY_CARE_PROVIDER_SITE_OTHER): Payer: BC Managed Care – PPO | Admitting: Sports Medicine

## 2013-03-06 ENCOUNTER — Encounter: Payer: Self-pay | Admitting: Sports Medicine

## 2013-03-06 VITALS — BP 123/83 | Ht 72.0 in | Wt 220.0 lb

## 2013-03-06 DIAGNOSIS — M722 Plantar fascial fibromatosis: Secondary | ICD-10-CM

## 2013-03-06 MED ORDER — METHYLPREDNISOLONE ACETATE 40 MG/ML IJ SUSP
40.0000 mg | Freq: Once | INTRAMUSCULAR | Status: AC
  Administered 2013-03-06: 40 mg via INTRA_ARTICULAR

## 2013-03-06 NOTE — Progress Notes (Signed)
Patient ID: Joseph BienenstockMichael Hershkowitz, male   DOB: October 23, 1955, 58 y.o.   MRN: [id number] 58 year old male with a history of plantar fasciitis bilaterally presents with recurrence of pain. Had corticosteroid injection done orthotics made by a local podiatrist possibly 4 months ago. Had relief of symptoms but they've returned. Has been doing some wall stretches otherwise not a lot of stretching.  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 Pain is becoming pretty severe. Pain is worse in the left than the right.  Pertinent past medical history is noncontributory  Review of systems as per history of present illness otherwise negative  Examination: BP 123/83  Ht 6' (1.829 m)  Wt 220 lb (99.791 kg)  BMI 29.83 kg/m2 Well-developed well-nourished 58 year old male awake alert oriented no acute distress  Bilateral feet: Tenderness to palpation the region of the origin of the plantar fascia. Tenderness is greater on the left than the right. Strength intact bilaterally. Normal Thompson test no tenderness to palpation of the Achilles tendon. Neurovascularly intact bilateral.  Muscular skeletal ultrasound was done images obtained revealed a letter fashion measuring 0.55 cm on the right and 0.60 cm on the left. 2-3 cm away from the origin both plantar fascia narrowed significantly and appeared normal. Is a question of a small bony spur on the anterior portion of the calcaneus of the left foot.  Procedure:  Injection of left plantar fascia Consent obtained and verified. Time-out conducted. Noted no overlying erythema, induration, or other signs of local infection. Skin prepped in a sterile fashion. Topical analgesic spray: Ethyl chloride. Completed without difficulty. Meds: 3:1 lido/depomedrol Pain immediately improved suggesting accurate placement of the medication. Advised to call if fevers/chills, erythema, induration, drainage, or persistent bleeding.

## 2013-03-06 NOTE — Assessment & Plan Note (Signed)
Corticosteroid injection given to the left plantar fashion today. Patient given home exercise program to include stretching of the plantar fascia. Continue to use his orthotics. Followup in one month's time. We'll reassess need for injection of the right plantar fashion at that time.

## 2013-03-17 ENCOUNTER — Ambulatory Visit: Payer: BC Managed Care – PPO | Admitting: Sports Medicine

## 2013-03-19 ENCOUNTER — Telehealth: Payer: Self-pay | Admitting: Infectious Diseases

## 2013-03-19 NOTE — Telephone Encounter (Signed)
Pt called ot make f/u appt

## 2013-04-03 ENCOUNTER — Ambulatory Visit: Payer: BC Managed Care – PPO | Admitting: Sports Medicine

## 2013-04-30 ENCOUNTER — Encounter: Payer: Self-pay | Admitting: Emergency Medicine

## 2013-04-30 ENCOUNTER — Ambulatory Visit (INDEPENDENT_AMBULATORY_CARE_PROVIDER_SITE_OTHER): Payer: BC Managed Care – PPO | Admitting: Emergency Medicine

## 2013-04-30 VITALS — BP 124/85 | HR 68 | Ht 72.0 in | Wt 220.0 lb

## 2013-04-30 DIAGNOSIS — M25569 Pain in unspecified knee: Secondary | ICD-10-CM

## 2013-04-30 DIAGNOSIS — M25562 Pain in left knee: Secondary | ICD-10-CM | POA: Insufficient documentation

## 2013-04-30 NOTE — Progress Notes (Signed)
Patient ID: Joseph Fuentes, male   DOB: [birth year], 58 y.o.   MRN: [id number] 58 year old male presents with complaint of left knee pain. Was walking at work after climbing up and down stairs and noted a pop sensation in his left knee. Pain immediately in the medial aspect of his knee. Continues to have intermittent episodes of pain. Denies any swelling at the time of injury. Pain is worse when rising from a sitting position or any twisting motion. He is similar but milder pain on the lateral side of his knee which is now within the past. In the past that pain was corrected with corticosteroid injections. He does continue to intermittently have symptoms however.  Review of systems as per history of present illness otherwise all systems negative  Examination BP 124/85  Pulse 68  Ht 6' (1.829 m)  Wt 220 lb (99.791 kg)  BMI 29.83 kg/m2 Well-developed well-nourished 58 year old male awake alert oriented no acute distress Left Knee: Normal to inspection with no erythema or effusion or obvious bony abnormalities. Palpation with medial joint line tenderness ROM normal in flexion and extension and lower leg rotation. Ligaments with solid consistent endpoints including ACL, PCL, LCL, MCL. Positive Mcmurray's and provocative meniscal tests. Non painful patellar compression. Patellar and quadriceps tendons unremarkable. Hamstring and quadriceps strength is normal.  Procedure:  Injection of left knee Consent obtained and verified. Time-out conducted. Noted no overlying erythema, induration, or other signs of local infection. Skin prepped in a sterile fashion. Topical analgesic spray: Ethyl chloride. Completed without difficulty. Meds: 80mg  depo/4cc lido Pain immediately improved suggesting accurate placement of the medication. Advised to call if fevers/chills, erythema, induration, drainage, or persistent bleeding.

## 2013-04-30 NOTE — Assessment & Plan Note (Signed)
I suspect his symptoms are related to a probable degenerative reveal meniscal tear today. I discussed therapeutic options which included corticosteroid injection and to continue wearing body helix compression sleeve for pursue an MRI with the possibility of surgical correction of a meniscal injury is found. At this time he would prefer not to have surgery or an MRI. We reviewed his prior x-rays there is no signs of significant osteoarthritis at this time. Corticosteroid injection was given today he tolerated the procedure well he'll followup as needed. If in the future he decides that he wishes to pursue an MRI and possible surgical correction he'll notify us. Followup as needed

## 2013-05-27 ENCOUNTER — Telehealth: Payer: Self-pay | Admitting: *Deleted

## 2013-05-27 NOTE — Telephone Encounter (Signed)
Pt saw Dr. Eulah PontMurphy for elbow surgery and wanted to see him for knee surgery.  Appt made for Dr. Eulah PontMurphy Tuesday May 5th at 3:30pm [address] [place] [phone]

## 2014-01-19 ENCOUNTER — Ambulatory Visit
Admission: RE | Admit: 2014-01-19 | Discharge: 2014-01-19 | Disposition: A | Discharge status: Home / Self Care | Payer: BC Managed Care – PPO | Source: Ambulatory Visit | Attending: Family Medicine | Admitting: Family Medicine

## 2014-01-19 ENCOUNTER — Other Ambulatory Visit: Payer: Self-pay | Admitting: Family Medicine

## 2014-01-19 DIAGNOSIS — R079 Chest pain, unspecified: Secondary | ICD-10-CM

## 2014-02-24 ENCOUNTER — Encounter: Payer: BC Managed Care – PPO | Admitting: Nurse Practitioner

## 2014-03-10 ENCOUNTER — Encounter: Payer: Self-pay | Admitting: Nurse Practitioner

## 2014-06-30 ENCOUNTER — Ambulatory Visit: Payer: Self-pay | Admitting: Sports Medicine

## 2014-07-13 ENCOUNTER — Ambulatory Visit: Payer: Self-pay | Admitting: Sports Medicine

## 2014-07-14 ENCOUNTER — Ambulatory Visit (INDEPENDENT_AMBULATORY_CARE_PROVIDER_SITE_OTHER): Payer: BLUE CROSS/BLUE SHIELD | Admitting: Sports Medicine

## 2014-07-14 ENCOUNTER — Encounter: Payer: Self-pay | Admitting: Sports Medicine

## 2014-07-14 VITALS — BP 114/77 | Ht 72.0 in | Wt 230.0 lb

## 2014-07-14 DIAGNOSIS — M25511 Pain in right shoulder: Secondary | ICD-10-CM | POA: Diagnosis not present

## 2014-07-14 MED ORDER — METHYLPREDNISOLONE ACETATE 40 MG/ML IJ SUSP
40.0000 mg | Freq: Once | INTRAMUSCULAR | Status: AC
  Administered 2014-07-14: 40 mg via INTRA_ARTICULAR

## 2014-07-14 NOTE — Progress Notes (Signed)
Subjective: Joseph Fuentes is a 59 y.o. ambidextrous male with a PMH of right shoulder cuff tendonitis presenting with right shoulder pain for the past 3-4 weeks. He states that he has been experiencing pain in the morning or after doing yard work. He localizes the pain to the lateral aspect of his shoulder, but also sometimes notes pain just above his right shoulder blade on the posterior and superior aspect of his right shoulder. He states that these symptoms are very similar to previous complaints that he has had over the past 10-15 years which have responded well to infrequent steroid injections. He says that the pain is exacerbated by axial loads on his arm, such as lifting himself up from his side. He says the pain responds to ibuprofen and naproxen but he does not want to take these medications regularly because he worries about his creatinine clearance. He endorses clicking/popping sensation, but denies any radiation, instability, weakness, neck pain, or decreased range of motion.   Objective:  BP 114/77 mmHg  Ht 6' (1.829 m)  Wt 230 lb (104.327 kg)  BMI 31.19 kg/m2 Right shoulder exam -- Inspection: No effusion, ecchymosis, or erythema. Palpation: Nontender throughout.  Range of motion: Decreased internal rotation of right shoulder; only reaches mid-lumbar spine versus left wrist reaching lower thoracic spine. Crepitus is evident on range of motion exam. Strength: Strength is intact and symmetric throughout. Special tests: Positive Hawkins. Positive empty can. Negative apprehension. Negative internal rotation lag sign. Negative drop-arm test.   Assessment/Plan: [name] is a 59 y.o. male with a PMH of right shoulder rotator cuff tendonitis, presenting for right shoulder pain of several weeks duration. Based on similarity to his previous symptoms, and based on his exam today with positive hawkins and positive empty can, this presentation most likely represents a recurrence of his rotator  cuff tendonitis. His strength and negative exam findings make rotator cuff tear unlikely. Based on his favorable response to steroid injections in the past, I have decided to repeat this therapy. We administered a depo-medrol injection to the sub-acromial space on his right shoulder. I have asked him to follow up with me as necessary.  Note dictated by Avie Arenas, MS-4.  Patient was seen and evaluated with the medical student. I agree with the above plan of care. In addition to the subacromial cortisone injection we also gave him a Jobe home exercise program. He will resume activity as tolerated and follow-up with me if symptoms persist or worsen.  Consent obtained and verified. Time-out conducted. Noted no overlying erythema, induration, or other signs of local infection. Skin prepped in a sterile fashion. Topical analgesic spray: Ethyl chloride. Joint: right shoulder (subacromial) Needle: 25g 1.5 inch Completed without difficulty. Meds: 3cc 1% xyloaciane, 1cc (40mg ) depomedrol  Advised to call if fevers/chills, erythema, induration, drainage, or persistent bleeding.

## 2014-08-10 ENCOUNTER — Encounter: Payer: Self-pay | Admitting: Sports Medicine

## 2014-08-19 ENCOUNTER — Ambulatory Visit (INDEPENDENT_AMBULATORY_CARE_PROVIDER_SITE_OTHER): Payer: BLUE CROSS/BLUE SHIELD | Admitting: Family Medicine

## 2014-08-19 ENCOUNTER — Encounter: Payer: Self-pay | Admitting: Family Medicine

## 2014-08-19 VITALS — BP 107/71 | HR 61 | Ht 72.0 in | Wt 230.0 lb

## 2014-08-19 DIAGNOSIS — M25511 Pain in right shoulder: Secondary | ICD-10-CM | POA: Diagnosis not present

## 2014-08-19 DIAGNOSIS — M25519 Pain in unspecified shoulder: Secondary | ICD-10-CM | POA: Insufficient documentation

## 2014-08-19 MED ORDER — METHYLPREDNISOLONE ACETATE 40 MG/ML IJ SUSP
40.0000 mg | Freq: Once | INTRAMUSCULAR | Status: AC
  Administered 2014-08-19: 40 mg via INTRA_ARTICULAR

## 2014-08-19 NOTE — Progress Notes (Signed)
  Joseph BienenstockMichael Vanderveer - 59 y.o. male MRN [id number]  Date of birth: [birth year]  SUBJECTIVE:  Including CC & ROS.  Joseph Fuentes is a 59 y.o. male who presents today for right shoulder pain.    Shoulder Pain right, follow-up visit - patient presents today for follow visit of ongoing right shoulder pain. He has been seen in the past for subacromial bursitis and impingement syndrome, in which she was injected. He last had this injected in the middle of June 2016, in which this did not really help the symptoms. Previous injections over the previous tendon 15 years had helped his symptoms mainly. He is right hand dominant, denies any new trauma. Pain is worse with overhead motion, or when he sleeps on this side. Denies any paresthesias or weakness of the right arm. He has previously also tried naproxen and ibuprofen for this but he does not want to continue taking this.   PMHx - Updated and reviewed.  Contributory factors include: Plantar fasciitis, rotator cuff tendinopathy PSHx - Updated and reviewed.  Contributory factors include:  Noncontributory FHx - Updated and reviewed.  Contributory factors include:  Noncontributory Medications - ibuprofen when necessary   ROS Per HPI   PE: Filed Vitals:   08/19/14 1530  BP: 107/71  Pulse: 61   Gen: NAD Cardiorespiratory - Normal respiratory effort/rate.  RRR  MSK Shoulder:  Shoulder: Inspection reveals no abnormalities, atrophy or asymmetry. Palpation is normal with no tenderness over AC joint or bicipital groove. ROM - 120 degrees flexion, 60 degrees extension, 90 abduction, 70 ER/IR  Rotator cuff strength normal throughout. Positive Neer and Hawkin's tests. Speeds and Yergason's tests normal. + Obrien's and crank tests Normal scapular function observed. No painful arc and no drop arm sign. No apprehension sign Negative Cross arm maneuver against resistance   Neurovascular status - Intact B/L UE  Imaging: Ultrasound images obtained in both long  and short axis. The right supraspinatus tendon shows evidence of some ill-defined heterogeneous tendon fibers consistent with tendinopathy. His subacromial bursa is enlarged over 2 mm with bursal wall thickening.

## 2014-08-19 NOTE — Assessment & Plan Note (Addendum)
Concern for intra-articular process including possible osteoarthritis versus labral tear. Injection of the right posterior glenohumeral joint done ultrasound guidance today. -Does not have improvement with this, he will need x-rays of the right shoulder which have not been done previously. After evaluating this, he may need an MRI with arthroscopic dye to evaluate a labral tear. -He will follow-up in 3-4 weeks to see how he is doing at that time. -In the meantime he'll continue with his home exercise program for rotator cuff tendinopathy and can take when necessary NSAIDs for pain.   Aspiration/Injection Procedure Note Joseph BienenstockMichael Fuentes 1955-12-02  Procedure: Injection Indications: R Shoulder Pain   Procedure Details Consent: Risks of procedure as well as the alternatives and risks of each were explained to the (patient/caregiver).  Consent for procedure obtained. Time Out: Verified patient identification, verified procedure, site/side was marked, verified correct patient position, special equipment/implants available, medications/allergies/relevent history reviewed, required imaging and test results available.  Performed.  The area was cleaned with iodine and alcohol swabs.    The R shoulder GH joint was injected using 1 cc's of 40mg  Depomedrol and 3 cc's of 1% lidocaine with a spinal needle.  Ultrasound was used. Images were obtained in Transverse and Long views showing the injection.    A sterile dressing was applied.  Patient did tolerate procedure well. Estimated blood loss: None

## 2014-09-16 ENCOUNTER — Other Ambulatory Visit: Payer: BLUE CROSS/BLUE SHIELD | Admitting: Family Medicine

## 2014-11-30 ENCOUNTER — Other Ambulatory Visit: Payer: Self-pay | Admitting: Pediatrics

## 2014-11-30 MED ORDER — IPRATROPIUM BROMIDE 0.06 % NA SOLN: 1.0000 | Freq: Two times a day (BID) | NASAL | Status: DC

## 2014-11-30 NOTE — Telephone Encounter (Signed)
Patient advised rx sent to [facility].

## 2014-11-30 NOTE — Telephone Encounter (Signed)
Need a refill on his ipratropian 3Xmonth supply. He want to know if there could be more refill times added. His number is [phone] and [facility] is [facility].

## 2014-11-30 NOTE — Telephone Encounter (Signed)
  Spoke with patient and we will refill patients atrovent nasal spray with a [facility] day supply [facility]. Waiting for pharmacy to fax over refill request. Patient is not sure which [facility] is using.

## 2015-01-10 ENCOUNTER — Other Ambulatory Visit: Payer: Self-pay | Admitting: Allergy and Immunology

## 2015-03-22 ENCOUNTER — Other Ambulatory Visit: Payer: Self-pay

## 2015-03-22 MED ORDER — IPRATROPIUM BROMIDE 0.06 % NA SOLN: 1.0000 | Freq: Two times a day (BID) | NASAL | Status: DC

## 2015-03-22 NOTE — Telephone Encounter (Signed)
Patient switched mail order pharmacies. He needs a refill on Ipratropium Bromide Nasal Spray .06%. The new Mail in Pharmacy is [facility] Fax# [phone].  Please Advise  Thanks

## 2015-03-22 NOTE — Telephone Encounter (Signed)
Medication sent to patient pharmacy and left voicemail for patient to return phone call.

## 2015-03-23 NOTE — Telephone Encounter (Signed)
Called and notified patient by patients personal voicemail. Did advise patient to contact us back if any questions.

## 2015-08-18 ENCOUNTER — Other Ambulatory Visit: Payer: Self-pay | Admitting: Allergy and Immunology

## 2015-08-18 ENCOUNTER — Telehealth: Payer: Self-pay | Admitting: Allergy and Immunology

## 2015-08-18 NOTE — Telephone Encounter (Signed)
Needs a refill on fluticasone (FLONASE).  Leaving to go out of town next Thursday but needs the refill this week because its through a mail order pharmacy.   Hasn't be seen since 05/2014 although he says he was seen this year.

## 2015-08-18 NOTE — Telephone Encounter (Signed)
Pt will be going to the local pharmacy to pick up flonase OTC

## 2015-08-18 NOTE — Telephone Encounter (Signed)
Left message for patient to call office. Patient has not been seen in Epic or [facility] since April 2016. Patient needs appointment. He can also get Flonase OTC or call primary care for refill.

## 2015-08-18 NOTE — Telephone Encounter (Signed)
Declined through [facility]

## 2015-08-18 NOTE — Telephone Encounter (Signed)
Spoke with patient regarding Fluticasone and getting OTC until he has his OV 09/14/15.

## 2015-08-19 ENCOUNTER — Telehealth: Payer: Self-pay | Admitting: Allergy and Immunology

## 2015-08-19 NOTE — Telephone Encounter (Signed)
error 

## 2015-09-14 ENCOUNTER — Encounter: Payer: Self-pay | Admitting: Allergy and Immunology

## 2015-09-14 ENCOUNTER — Ambulatory Visit (INDEPENDENT_AMBULATORY_CARE_PROVIDER_SITE_OTHER): Payer: BLUE CROSS/BLUE SHIELD | Admitting: Allergy and Immunology

## 2015-09-14 VITALS — BP 118/82 | HR 68 | Resp 20

## 2015-09-14 DIAGNOSIS — Z8709 Personal history of other diseases of the respiratory system: Secondary | ICD-10-CM

## 2015-09-14 DIAGNOSIS — H1010 Acute atopic conjunctivitis, unspecified eye: Secondary | ICD-10-CM

## 2015-09-14 DIAGNOSIS — J309 Allergic rhinitis, unspecified: Secondary | ICD-10-CM | POA: Diagnosis not present

## 2015-09-14 DIAGNOSIS — K219 Gastro-esophageal reflux disease without esophagitis: Secondary | ICD-10-CM

## 2015-09-14 MED ORDER — IPRATROPIUM BROMIDE 0.06 % NA SOLN: 1.0000 | Freq: Two times a day (BID) | NASAL | 3 refills | Status: DC

## 2015-09-14 MED ORDER — FLUTICASONE PROPIONATE 50 MCG/ACT NA SUSP: 2.0000 | Freq: Every day | NASAL | 3 refills | Status: DC

## 2015-09-14 NOTE — Patient Instructions (Addendum)
  1. Continue Flonase one-2 sprays each nostril one time per day  2. Continue nasal ipratropium 0.06% 2 sprays each nostril up to 4 times per day if needed  3. Consider consolidating caffeine to help with reflux management  4. Obtain fall flu vaccine  5. Return to clinic in 1 year or earlier if problem

## 2015-09-14 NOTE — Progress Notes (Signed)
Follow-up Note  Referring Provider: [name], MD Primary Provider: Lolita Patella, MD Date of Office Visit: 09/14/2015  Subjective:   Joseph Fuentes (DOB: Jun 14, 1955) is a 60 y.o. male who [facility] in re-evaluation of the following:  HPI: Joseph Fuentes returns to this clinic in reevaluation of his allergic rhinoconjunctivitis and distant history of asthma and reflux. He has not been seen in his clinic since April 2016.  Regarding his nasal issue, he does quite well while using a combination of Flonase and nasal ipratropium. It does not sound as though he has required a antibiotic to treat an episode of sinusitis during this interval of time since his last visit. He can go through the seasons of the year without any difficulty.  Asthma has not been an issue and in fact he has not used a bronchodilator in approximately 2 years and does not have any exercise-induced bronchospastic symptoms or cold air induced bronchospastic symptoms and has not required a systemic steroid to treat an asthma exacerbation ever.  His reflux is very significant and he is presently using Dexilant every other day and omeprazole every other day along with omeprazole at nighttime on a daily basis. He cannot use Dexilant daily because of side effects but this medication actually works very well while using it every other day. He still continues to have some reflux episodes. He still drinks coffee every morning and rarely has chocolate and does not drink alcohol more than 3 beers a week.    Medication List      ALLEGRA-D ALLERGY & CONGESTION 180-240 MG 24 hr tablet Generic drug:  fexofenadine-pseudoephedrine Take 1 tablet by mouth daily as needed.   ALPRAZolam 0.5 MG tablet Commonly known as:  XANAX 0.5-1 tablet by mouth as needed.   desoximetasone 0.25 % cream Commonly known as:  TOPICORT Apply 1 application topically daily as needed.   DEXILANT 60 MG  capsule Generic drug:  dexlansoprazole Take 60 mg by mouth every other day.   eletriptan 40 MG tablet Commonly known as:  RELPAX One tablet by mouth at onset of headache. May repeat in 2 hours if headache persists or recurs.   FLOMAX 0.4 MG Caps capsule Generic drug:  tamsulosin 1 capsule 30 minutes after the same meal each day   fluticasone 50 MCG/ACT nasal spray Commonly known as:  FLONASE Place 2 sprays into both nostrils daily.   HYDROcodone-acetaminophen 5-325 MG tablet Commonly known as:  NORCO/VICODIN   hydrocortisone 2.5 % rectal cream Commonly known as:  ANUSOL-HC Place rectally as needed.   hydrocortisone 25 MG suppository Commonly known as:  ANUSOL-HC as needed.   ibuprofen 600 MG tablet Commonly known as:  ADVIL,MOTRIN Take 600 mg by mouth as needed.   ipratropium 0.06 % nasal spray Commonly known as:  ATROVENT Place 1 spray into the nose 2 (two) times daily.   ketoconazole 2 % cream Commonly known as:  NIZORAL Apply 1 application topically daily.   levothyroxine 100 MCG tablet Commonly known as:  SYNTHROID, LEVOTHROID Take 250 mcg by mouth daily.   NAFTIN 2 % Crea Generic drug:  Naftifine HCl as needed.   naproxen sodium 550 MG tablet Commonly known as:  ANAPROX Take 550 mg by mouth as needed.   omeprazole 40 MG capsule Commonly known as:  PRILOSEC Take 1-2 tablets by mouth Daily.   PROVENTIL HFA 108 (90 Base) MCG/ACT inhaler Generic drug:  albuterol Inhale 2 puffs into the lungs Every  4 hours as needed.   topiramate 100 MG tablet Commonly known as:  TOPAMAX Take 150 mg by mouth daily.   triamcinolone ointment 0.1 % Commonly known as:  KENALOG as needed.   WELCHOL 625 MG tablet Generic drug:  colesevelam Take 3 tablets by mouth Twice daily.   zolpidem 12.5 MG CR tablet Commonly known as:  AMBIEN CR       History reviewed. No pertinent past medical history.  History reviewed. No pertinent surgical history.  Allergies    Allergen Reactions  . Penicillins     Review of systems negative except as noted in HPI / PMHx or noted below:  Review of Systems  Constitutional: Negative.   HENT: Negative.   Eyes: Negative.   Respiratory: Negative.   Cardiovascular: Negative.   Gastrointestinal: Negative.   Genitourinary: Negative.   Musculoskeletal: Negative.   Skin: Negative.   Neurological: Negative.   Endo/Heme/Allergies: Negative.   Psychiatric/Behavioral: Negative.      Objective:   Vitals:   09/14/15 1639  BP: 118/82  Pulse: 68  Resp: 20          Physical Exam  Constitutional: He is well-developed, well-nourished, and in no distress.  HENT:  Head: Normocephalic.  Right Ear: Tympanic membrane, external ear and ear canal normal.  Left Ear: Tympanic membrane, external ear and ear canal normal.  Nose: Nose normal. No mucosal edema or rhinorrhea.  Mouth/Throat: Uvula is midline, oropharynx is clear and moist and mucous membranes are normal. No oropharyngeal exudate.  Eyes: Conjunctivae are normal.  Neck: Trachea normal. No tracheal tenderness present. No tracheal deviation present. No thyromegaly present.  Cardiovascular: Normal rate, regular rhythm, S1 normal, S2 normal and normal heart sounds.   No murmur heard. Pulmonary/Chest: Breath sounds normal. No stridor. No respiratory distress. He has no wheezes. He has no rales.  Musculoskeletal: He exhibits no edema.  Lymphadenopathy:       Head (right side): No tonsillar adenopathy present.       Head (left side): No tonsillar adenopathy present.    He has no cervical adenopathy.  Neurological: He is alert. Gait normal.  Skin: No rash noted. He is not diaphoretic. No erythema. Nails show no clubbing.  Psychiatric: Mood and affect normal.    Diagnostics:    Spirometry was not performed as per patient's wishes  Assessment and Plan:   1. Allergic rhinoconjunctivitis   2. History of asthma   3. Gastroesophageal reflux disease,  esophagitis presence not specified     1. Continue Flonase one-2 sprays each nostril one time per day  2. Continue nasal ipratropium 0.06% 2 sprays each nostril up to 4 times per day if needed  3. Consider consolidating caffeine to help with reflux management  4. Obtain fall flu vaccine  5. Return to clinic in 1 year or earlier if problem  I'm going to have Casimiro NeedleMichael continue to use Flonase and nasal ipratropium and he can return to this clinic in approximately one year to readdress this issue. If he has problems during the interval he will contact me for further evaluation treatment. It does not appear as though asthma is a active or big issue at this point in time and given the fact that he has not used a bronchodilator in several years and he does not believe that he has asthma we will not provide him a short acting bronchodilator at this point. I did talk with him about consolidating his caffeine consumption to help with management of his  reflux. I also informed him that he should obtain a flu vaccine this fall.  Laurette SchimkeEric Kozlow, MD [facility]

## 2015-12-03 ENCOUNTER — Encounter: Payer: Self-pay | Admitting: Gastroenterology

## 2016-02-02 DIAGNOSIS — Z5181 Encounter for therapeutic drug level monitoring: Secondary | ICD-10-CM | POA: Diagnosis not present

## 2016-02-17 ENCOUNTER — Ambulatory Visit: Payer: BLUE CROSS/BLUE SHIELD | Admitting: Neurology

## 2016-02-21 DIAGNOSIS — G43909 Migraine, unspecified, not intractable, without status migrainosus: Secondary | ICD-10-CM | POA: Diagnosis not present

## 2016-02-21 DIAGNOSIS — G4700 Insomnia, unspecified: Secondary | ICD-10-CM | POA: Diagnosis not present

## 2016-02-21 DIAGNOSIS — Z125 Encounter for screening for malignant neoplasm of prostate: Secondary | ICD-10-CM | POA: Diagnosis not present

## 2016-02-21 DIAGNOSIS — E039 Hypothyroidism, unspecified: Secondary | ICD-10-CM | POA: Diagnosis not present

## 2016-02-21 DIAGNOSIS — Z23 Encounter for immunization: Secondary | ICD-10-CM | POA: Diagnosis not present

## 2016-02-21 DIAGNOSIS — Z0000 Encounter for general adult medical examination without abnormal findings: Secondary | ICD-10-CM | POA: Diagnosis not present

## 2016-02-21 DIAGNOSIS — E782 Mixed hyperlipidemia: Secondary | ICD-10-CM | POA: Diagnosis not present

## 2016-02-21 DIAGNOSIS — K219 Gastro-esophageal reflux disease without esophagitis: Secondary | ICD-10-CM | POA: Diagnosis not present

## 2016-03-07 ENCOUNTER — Encounter: Payer: Self-pay | Admitting: Neurology

## 2016-03-07 ENCOUNTER — Ambulatory Visit (INDEPENDENT_AMBULATORY_CARE_PROVIDER_SITE_OTHER): Payer: BLUE CROSS/BLUE SHIELD | Admitting: Neurology

## 2016-03-07 VITALS — BP 127/73 | HR 83 | Resp 20 | Ht 72.0 in | Wt 230.0 lb

## 2016-03-07 DIAGNOSIS — G4440 Drug-induced headache, not elsewhere classified, not intractable: Secondary | ICD-10-CM | POA: Diagnosis not present

## 2016-03-07 DIAGNOSIS — G43709 Chronic migraine without aura, not intractable, without status migrainosus: Secondary | ICD-10-CM | POA: Diagnosis not present

## 2016-03-07 MED ORDER — TOPIRAMATE 100 MG PO TABS: 200.0000 mg | ORAL_TABLET | Freq: Every day | ORAL | 12 refills | Status: DC

## 2016-03-07 MED ORDER — METHYLPREDNISOLONE 4 MG PO TBPK: ORAL_TABLET | ORAL | 1 refills | Status: DC

## 2016-03-07 NOTE — Progress Notes (Signed)
[facility]    Provider:  Dr Lucia GaskinsAhern Referring Provider: Elias Fuentes, Robert, MD Primary Care Physician:  Joseph Fuentes,Joseph Fuentes ALEXANDER, MD  CC:  Migraines  HPI:  Joseph BienenstockMichael Fuentes is a 61 y.o. male here as a referral from Dr. Nicholos Fuentes for migraines. PMHx migraines, HLD, IBS, hypothyroidism, insomnia, peripheral neuropathy. He has had migraines all his life. He started having migraines at the age of 5 or 6, a celiac diet helped when he was young. As he got older he didn't adhere to the diet. His baseline was 3-4 a month with exacerbations of 3 a week. After losing weight 30 pounds he didn't have many headaches during that timeframe. The last 6 months his migraines have been worse, no inciting events except when his diet changed. Migraines start in the right cheek and radiates to the back of the right eye and temple and whole side of the right face. Relpax helps if he catches it. 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 Ice pack helps. No auras or flashing lights. No prodrome. They can occur in the mid morning or later in the evening. He has insomnia at least one night a week. He takes [place] every night and still some nights.he has headaches every day and most are migainous. He takes excedrin 6x a month, relpax 15x a month and ibuprofen 10x a month. No other focal neurologic deficits, associated symptoms, inciting events or modifiable factors.  Migraine medications tried: Topiramate, relpax  Reviewed notes, labs and imaging from outside physicians, which showed:  Reviewed primary care notes: He asked for 180 pills of Relpax, insurance only willing to dispense 36 tabs. Plan usually does not cover more than 12 tabs per month and will have to pay out of ocket for anything over that. Then patient said that he doesn't really need 180 Relpax every 90 days however after going for urine half without significant migraines  his headaches have returned and now he's backed needing at least 72 tablets every 90 days. 72 tablets of Relpax were sent to the patient's pharmacy. He is on topiramate 150 mg at night, also Xanax, ibuprofen 800 mg 3 times a day, Excedrin extra strength when necessary per med list. He is maintained on Topamax and back in July 2017 he was saying he was having 3-4 migraines per month. He uses Relpax with good response. He also has insomnia and currently taking Ambien. I reviewed exam which included normal general, head, eyes, ears, nose, throat, neck, chest, lungs, heart, extremities, skin.   CMP normal 03/24/2015 BUN 22 and creatinine 1.19, CBC 02/15/2015 normal  CT maxillofacial 04/23/2008 personally reviewed report:  Comparison: None   Findings: Slight inflammatory enlargement right inferior nasal turbinates seen.  Minimal 5mm and 3 mm posterior nasal septal mucosal inflammatory polyps are seen.  9 mm mucous retention cyst is seen at the anteromedial inferior left maxillary antrum with paranasal sinuses otherwise clear.  Bilateral infundibuli appear widely patent.  No other significant abnormalities seen. 9 mm mucous retention cyst is seen at the anterior inferior left maxillary antrum (axial image 37).  Visualized paranasal sinuses are otherwise clear.  Middle ear cavities, mastoid air cells appear clear.   IMPRESSION:   1.  Minimal rhinitis findings and 9 mm left maxillary mucous retention cyst as described. 2.  Clear paranasal sinuses. 3.  No additional significant abnormality  Review of Systems: Patient complains of symptoms per HPI as well  as the following symptoms: Feeling cold, allergies, runny nose, headache, insomnia. Pertinent negatives per HPI. All others negative.   Social History   Social History  . Marital status: Married    Spouse name: N/A  . Number of children: N/A  . Years of education: N/A   Occupational History  . printing    Social History Main Topics    . Smoking status: Former Smoker    Types: Cigarettes    Quit date: 06/28/2006  . Smokeless tobacco: Never Used  . Alcohol use Not on file  . Drug use: Unknown  . Sexual activity: Not on file   Other Topics Concern  . Not on file   Social History Narrative  . No narrative on file    Family History  Problem Relation Age of Onset  . Migraines Neg Hx     Past Medical History:  Diagnosis Date  . Celiac disease/sprue   . Concussion   . Deviated septum   . OSA (obstructive sleep apnea)   . Rheumatic fever     Past Surgical History:  Procedure Laterality Date  . arthroscopic knee surgery Left   . SEPTOPLASTY    . TENNIS ELBOW RELEASE/NIRSCHEL PROCEDURE    . VASECTOMY      Current Outpatient Prescriptions  Medication Sig Dispense Refill  . ALLEGRA-D ALLERGY & CONGESTION 180-240 MG 24 hr tablet Take 1 tablet by mouth daily as needed.  3  . ALPRAZolam (XANAX) 0.5 MG tablet 0.5-1 tablet by mouth as needed.    [name] aspirin-acetaminophen-caffeine (EXCEDRIN MIGRAINE) 250-250-65 MG tablet Take by mouth every 6 (six) hours as needed for headache.    . desoximetasone (TOPICORT) 0.25 % cream Apply 1 application topically daily as needed.    [name] DEXILANT 60 MG capsule Take 60 mg by mouth every other day.    . docusate sodium (COLACE) 100 MG capsule Take 100 mg by mouth 2 (two) times daily.    [name] eletriptan (RELPAX) 40 MG tablet One tablet by mouth at onset of headache. May repeat in 2 hours if headache persists or recurs.     . fluticasone (FLONASE) 50 MCG/ACT nasal spray Place 2 sprays into both nostrils daily. 48 g 3  . HYDROcodone-acetaminophen (NORCO/VICODIN) 5-325 MG per tablet     . ibuprofen (ADVIL,MOTRIN) 600 MG tablet Take 600 mg by mouth as needed.    [name] ipratropium (ATROVENT) 0.06 % nasal spray Place 1 spray into the nose 2 (two) times daily. 45 mL 3  . ketoconazole (NIZORAL) 2 % cream Apply 1 application topically daily.    [name] levothyroxine (SYNTHROID) 300 MCG tablet Take 300 mcg  by mouth daily.    [name] NAFTIN 2 % CREA as needed.    . naproxen sodium (ANAPROX) 550 MG tablet Take 550 mg by mouth as needed.    [name] omeprazole (PRILOSEC) 40 MG capsule Take 1-2 tablets by mouth Daily.    [name] PROVENTIL HFA 108 (90 BASE) MCG/ACT inhaler Inhale 2 puffs into the lungs Every 4 hours as needed.    . tamsulosin (FLOMAX) 0.4 MG CAPS capsule 1 capsule 30 minutes after the same meal each day    . topiramate (TOPAMAX) 100 MG tablet Take 2 tablets (200 mg total) by mouth daily. 60 tablet 12  . triamcinolone ointment (KENALOG) 0.1 % as needed.    Lilian Kapur 625 MG tablet Take 3 tablets by mouth Twice daily.    [name] zolpidem (AMBIEN CR) 12.5 MG CR tablet     .  methylPREDNISolone (MEDROL DOSEPAK) 4 MG TBPK tablet follow package directions 21 tablet 1   No current facility-administered medications for this visit.     Allergies as of 03/07/2016 - Review Complete 03/07/2016  Allergen Reaction Noted  . Penicillins      Vitals: BP 127/73   Pulse 83   Resp 20   Ht 6' (1.829 m)   Wt 230 lb (104.3 kg)   BMI 31.19 kg/m  Last Weight:  Wt Readings from Last 1 Encounters:  03/07/16 230 lb (104.3 kg)   Last Height:   Ht Readings from Last 1 Encounters:  03/07/16 6' (1.829 m)    Physical exam: Exam: Gen: NAD, conversant, well nourised, obese, well groomed                     CV: RRR, no MRG. No Carotid Bruits. No peripheral edema, warm, nontender Eyes: Conjunctivae clear without exudates or hemorrhage  Neuro: Detailed Neurologic Exam  Speech:    Speech is normal; fluent and spontaneous with normal comprehension.  Cognition:    The patient is oriented to person, place, and time;     recent and remote memory intact;     language fluent;     normal attention, concentration,     fund of knowledge Cranial Nerves:    The pupils are equal, round, and reactive to light. The fundi are normal and spontaneous venous pulsations are present. Visual fields are full to finger confrontation.  Extraocular movements are intact. Trigeminal sensation is intact and the muscles of mastication are normal. The face is symmetric. The palate elevates in the midline. Hearing intact. Voice is normal. Shoulder shrug is normal. The tongue has normal motion without fasciculations.   Coordination:    Normal finger to nose and heel to shin. Normal rapid alternating movements.   Gait:    Heel-toe and tandem gait are normal.   Motor Observation:    No asymmetry, no atrophy, and no involuntary movements noted. Tone:    Normal muscle tone.    Posture:    Posture is normal. normal erect    Strength:    Strength is V/V in the upper and lower limbs.      Sensation: intact to LT     Reflex Exam:  DTR's:    Deep tendon reflexes in the upper and lower extremities are normal bilaterally.   Toes:    The toes are downgoing bilaterally.   Clonus:    Clonus is absent.      Assessment/Plan:  Very nice 61 year old with chronic migraines with a component of medication oversue  - uses triptans, excedrin and ibuprofen appears almost daily. Discussed rebound headache and the need to decrease use of all of these no more than 2-3x a week.  - No more than 12 tabs of a triptan every month as anything more than this can actually cause worsening headaches and rebound which then is very difficult to treat. - Increase Topiramate to 200mg  at night. If needed we could also add Cymbalta or a b-blocker like propranolol or atenolol.  - 1 week steroid taper - Patient is to email me in several weeks with his progress and we can make adjustments if needed Discussed the following:  To prevent or relieve headaches, try the following: Cool Compress. Lie down and place a cool compress on your head.  Avoid headache triggers. If certain foods or odors seem to have triggered your migraines in the past, avoid them.  A headache diary might help you identify triggers.  Include physical activity in your daily routine. Try a  daily walk or other moderate aerobic exercise.  Manage stress. Find healthy ways to cope with the stressors, such as delegating tasks on your to-do list.  Practice relaxation techniques. Try deep breathing, yoga, massage and visualization.  Eat regularly. Eating regularly scheduled meals and maintaining a healthy diet might help prevent headaches. Also, drink plenty of fluids.  Follow a regular sleep schedule. Sleep deprivation might contribute to headaches Consider biofeedback. With this mind-body technique, you learn to control certain bodily functions - such as muscle tension, heart rate and blood pressure - to prevent headaches or reduce headache pain.    Proceed to emergency room if you experience new or worsening symptoms or symptoms do not resolve, if you have new neurologic symptoms or if headache is severe, or for any concerning symptom.    Naomie Dean, MD  [facility] [address] [place], [place] [address]  Phone [phone] Fax [phone]

## 2016-03-07 NOTE — Patient Instructions (Addendum)
Remember to drink plenty of fluid, eat healthy meals and do not skip any meals. Try to eat protein with a every meal and eat a healthy snack such as fruit or nuts in between meals. Try to keep a regular sleep-wake schedule and try to exercise daily, particularly in the form of walking, 20-30 minutes a day, if you can.   As far as your medications are concerned, I would like to suggest: Increase Topiramate to 200mg  a night.  Medrol dosepak.  Our phone number is [phone]. We also have an after hours call service for urgent matters and there is a physician on-call for urgent questions. For any emergencies you know to call 911 or go to the nearest emergency room  Methylprednisolone tablets What is this medicine? METHYLPREDNISOLONE (meth ill pred NISS oh lone) is a corticosteroid. It is commonly used to treat inflammation of the skin, joints, lungs, and other organs. Common conditions treated include asthma, allergies, and arthritis. It is also used for other conditions, such as blood disorders and diseases of the adrenal glands. This medicine may be used for other purposes; ask your health care provider or pharmacist if you have questions. COMMON BRAND NAME(S): Medrol, Medrol Dosepak What should I tell my health care provider before I take this medicine? They need to know if you have any of these conditions: -Cushing's syndrome -eye disease, vision problems -diabetes -glaucoma -heart disease -high blood pressure -infection (especially a virus infection such as chickenpox, cold sores, or herpes) -liver disease -mental illness -myasthenia gravis -osteoporosis -recently received or scheduled to receive a vaccine -seizures -stomach or intestine problems -thyroid disease -an unusual or allergic reaction to lactose, methylprednisolone, other medicines, foods, dyes, or preservatives -pregnant or trying to get pregnant -breast-feeding How should I use this medicine? Take this medicine by  mouth with a glass of water. Follow the directions on the prescription label. Take this medicine with food. If you are taking this medicine once a day, take it in the morning. Do not take it more often than directed. Do not suddenly stop taking your medicine because you may develop a severe reaction. Your doctor will tell you how much medicine to take. If your doctor wants you to stop the medicine, the dose may be slowly lowered over time to avoid any side effects. Talk to your pediatrician regarding the use of this medicine in children. Special care may be needed. Overdosage: If you think you have taken too much of this medicine contact a poison control center or emergency room at once. NOTE: This medicine is only for you. Do not share this medicine with others. What if I miss a dose? If you miss a dose, take it as soon as you can. If it is almost time for your next dose, talk to your doctor or health care professional. You may need to miss a dose or take an extra dose. Do not take double or extra doses without advice. What may interact with this medicine? Do not take this medicine with any of the following medications: -alefacept -echinacea -live virus vaccines -metyrapone -mifepristone This medicine may also interact with the following medications: -amphotericin B -aspirin and aspirin-like medicines -certain antibiotics like erythromycin, clarithromycin, troleandomycin -certain medicines for diabetes -certain medicines for fungal infections like ketoconazole -certain medicines for seizures like carbamazepine, phenobarbital, phenytoin -certain medicines that treat or prevent blood clots like warfarin -cholestyramine -cyclosporine -digoxin -diuretics -male hormones, like estrogens and birth control pills -isoniazid -NSAIDs, medicines for pain inflammation, like ibuprofen or  naproxen -other medicines for myasthenia gravis -rifampin -vaccines This list may not describe all possible  interactions. Give your health care provider a list of all the medicines, herbs, non-prescription drugs, or dietary supplements you use. Also tell them if you smoke, drink alcohol, or use illegal drugs. Some items may interact with your medicine. What should I watch for while using this medicine? Tell your doctor or healthcare professional if your symptoms do not start to get better or if they get worse. Do not stop taking except on your doctor's advice. You may develop a severe reaction. Your doctor will tell you how much medicine to take. This medicine may increase your risk of getting an infection. Tell your doctor or health care professional if you are around anyone with measles or chickenpox, or if you develop sores or blisters that do not heal properly. This medicine may affect blood sugar levels. If you have diabetes, check with your doctor or health care professional before you change your diet or the dose of your diabetic medicine. Tell your doctor or health care professional right away if you have any change in your eyesight. Using this medicine for a long time may increase your risk of low bone mass. Talk to your doctor about bone health. What side effects may I notice from receiving this medicine? Side effects that you should report to your doctor or health care professional as soon as possible: -allergic reactions like skin rash, itching or hives, swelling of the face, lips, or tongue -bloody or tarry stools -changes in vision -hallucination, loss of contact with reality -muscle cramps -muscle pain -palpitations -signs and symptoms of high blood sugar such as dizziness; dry mouth; dry skin; fruity breath; nausea; stomach pain; increased hunger or thirst; increased urination -signs and symptoms of infection like fever or chills; cough; sore throat; pain or trouble passing urine -trouble passing urine or change in the amount of urine Side effects that usually do not require medical  attention (report to your doctor or health care professional if they continue or are bothersome): -changes in emotions or mood -constipation -diarrhea -excessive hair growth on the face or body -headache -nausea, vomiting -trouble sleeping -weight gain This list may not describe all possible side effects. Call your doctor for medical advice about side effects. You may report side effects to FDA at 1-800-FDA-1088. Where should I keep my medicine? Keep out of the reach of children. Store at room temperature between 20 and 25 degrees C (68 and 77 degrees F). Throw away any unused medicine after the expiration date. NOTE: This sheet is a summary. It may not cover all possible information. If you have questions about this medicine, talk to your doctor, pharmacist, or health care provider.  2017 Elsevier/Gold Standard (2015-03-25 15:53:30)  Topiramate tablets What is this medicine? TOPIRAMATE (toe PYRE a mate) is used to treat seizures in adults or children with epilepsy. It is also used for the prevention of migraine headaches. This medicine may be used for other purposes; ask your health care provider or pharmacist if you have questions. COMMON BRAND NAME(S): Topamax, Topiragen What should I tell my health care provider before I take this medicine? They need to know if you have any of these conditions: -bleeding disorders -cirrhosis of the liver or liver disease -diarrhea -glaucoma -kidney stones or kidney disease -low blood counts, like low white cell, platelet, or red cell counts -lung disease like asthma, obstructive pulmonary disease, emphysema -metabolic acidosis -on a ketogenic diet -schedule for surgery  or a procedure -suicidal thoughts, plans, or attempt; a previous suicide attempt by you or a family member -an unusual or allergic reaction to topiramate, other medicines, foods, dyes, or preservatives -pregnant or trying to get pregnant -breast-feeding How should I use this  medicine? Take this medicine by mouth with a glass of water. Follow the directions on the prescription label. Do not crush or chew. You may take this medicine with meals. Take your medicine at regular intervals. Do not take it more often than directed. Talk to your pediatrician regarding the use of this medicine in children. Special care may be needed. While this drug may be prescribed for children as young as 392 years of age for selected conditions, precautions do apply. Overdosage: If you think you have taken too much of this medicine contact a poison control center or emergency room at once. NOTE: This medicine is only for you. Do not share this medicine with others. What if I miss a dose? If you miss a dose, take it as soon as you can. If your next dose is to be taken in less than 6 hours, then do not take the missed dose. Take the next dose at your regular time. Do not take double or extra doses. What may interact with this medicine? Do not take this medicine with any of the following medications: -probenecid This medicine may also interact with the following medications: -acetazolamide -alcohol -amitriptyline -aspirin and aspirin-like medicines -birth control pills -certain medicines for depression -certain medicines for seizures -certain medicines that treat or prevent blood clots like warfarin, enoxaparin, dalteparin, apixaban, dabigatran, and rivaroxaban -digoxin -hydrochlorothiazide -lithium -medicines for pain, sleep, or muscle relaxation -metformin -methazolamide -NSAIDS, medicines for pain and inflammation, like ibuprofen or naproxen -pioglitazone -risperidone This list may not describe all possible interactions. Give your health care provider a list of all the medicines, herbs, non-prescription drugs, or dietary supplements you use. Also tell them if you smoke, drink alcohol, or use illegal drugs. Some items may interact with your medicine. What should I watch for while  using this medicine? Visit your doctor or health care professional for regular checks on your progress. Do not stop taking this medicine suddenly. This increases the risk of seizures if you are using this medicine to control epilepsy. Wear a medical identification bracelet or chain to say you have epilepsy or seizures, and carry a card that lists all your medicines. This medicine can decrease sweating and increase your body temperature. Watch for signs of deceased sweating or fever, especially in children. Avoid extreme heat, hot baths, and saunas. Be careful about exercising, especially in hot weather. Contact your health care provider right away if you notice a fever or decrease in sweating. You should drink plenty of fluids while taking this medicine. If you have had kidney stones in the past, this will help to reduce your chances of forming kidney stones. If you have stomach pain, with nausea or vomiting and yellowing of your eyes or skin, call your doctor immediately. You may get drowsy, dizzy, or have blurred vision. Do not drive, use machinery, or do anything that needs mental alertness until you know how this medicine affects you. To reduce dizziness, do not sit or stand up quickly, especially if you are an older patient. Alcohol can increase drowsiness and dizziness. Avoid alcoholic drinks. If you notice blurred vision, eye pain, or other eye problems, seek medical attention at once for an eye exam. The use of this medicine may increase the chance  of suicidal thoughts or actions. Pay special attention to how you are responding while on this medicine. Any worsening of mood, or thoughts of suicide or dying should be reported to your health care professional right away. This medicine may increase the chance of developing metabolic acidosis. If left untreated, this can cause kidney stones, bone disease, or slowed growth in children. Symptoms include breathing fast, fatigue, loss of appetite, irregular  heartbeat, or loss of consciousness. Call your doctor immediately if you experience any of these side effects. Also, tell your doctor about any surgery you plan on having while taking this medicine since this may increase your risk for metabolic acidosis. Birth control pills may not work properly while you are taking this medicine. Talk to your doctor about using an extra method of birth control. Women who become pregnant while using this medicine may enroll in the [place] [employer] Antiepileptic Drug Pregnancy Registry by calling [phone]. This registry collects information about the safety of antiepileptic drug use during pregnancy. What side effects may I notice from receiving this medicine? Side effects that you should report to your doctor or health care professional as soon as possible: -allergic reactions like skin rash, itching or hives, swelling of the face, lips, or tongue -decreased sweating and/or rise in body temperature -depression -difficulty breathing, fast or irregular breathing patterns -difficulty speaking -difficulty walking or controlling muscle movements -hearing impairment -redness, blistering, peeling or loosening of the skin, including inside the mouth -tingling, pain or numbness in the hands or feet -unusual bleeding or bruising -unusually weak or tired -worsening of mood, thoughts or actions of suicide or dying Side effects that usually do not require medical attention (report to your doctor or health care professional if they continue or are bothersome): -altered taste -back pain, joint or muscle aches and pains -diarrhea, or constipation -headache -loss of appetite -nausea -stomach upset, indigestion -tremors This list may not describe all possible side effects. Call your doctor for medical advice about side effects. You may report side effects to FDA at 1-800-FDA-1088. Where should I keep my medicine? Keep out of the reach of children. Store at room  temperature between 15 and 30 degrees C (59 and 86 degrees F) in a tightly closed container. Protect from moisture. Throw away any unused medicine after the expiration date. NOTE: This sheet is a summary. It may not cover all possible information. If you have questions about this medicine, talk to your doctor, pharmacist, or health care provider.  2017 Elsevier/Gold Standard (2013-01-20 23:17:57)

## 2016-03-09 ENCOUNTER — Telehealth: Payer: Self-pay | Admitting: Neurology

## 2016-03-09 NOTE — Telephone Encounter (Signed)
Usually when taking a steroid dose pack, pt starts w/ 6 tabs the first day, 5 days the next, then decreasing # of tabs by 1 each day until gone (4, then 3, 2, 1). Returned pt TC and left VM mssg to call back for further instruction.

## 2016-03-09 NOTE — Telephone Encounter (Signed)
Patient called needing clarification on directions for  methylPREDNISolone (MEDROL DOSEPAK) 4 MG TBPK tablet.  Please call

## 2016-03-09 NOTE — Telephone Encounter (Signed)
Patient returning your call.

## 2016-03-09 NOTE — Telephone Encounter (Signed)
Called pt back again and reviewed titration instructions.  Verbalized understanding and appreciation for call. Says he will start medrol dose pack this morning.

## 2016-03-14 ENCOUNTER — Other Ambulatory Visit: Payer: Self-pay

## 2016-03-14 MED ORDER — TOPIRAMATE 200 MG PO TABS: 200.0000 mg | ORAL_TABLET | Freq: Every day | ORAL | 3 refills | Status: DC

## 2016-03-14 NOTE — Telephone Encounter (Signed)
90 day refills e-scribed to mail order pharmacy per faxed request.

## 2016-03-24 DIAGNOSIS — Z1211 Encounter for screening for malignant neoplasm of colon: Secondary | ICD-10-CM | POA: Diagnosis not present

## 2016-03-24 DIAGNOSIS — K635 Polyp of colon: Secondary | ICD-10-CM | POA: Diagnosis not present

## 2016-03-24 DIAGNOSIS — K648 Other hemorrhoids: Secondary | ICD-10-CM | POA: Diagnosis not present

## 2016-03-28 DIAGNOSIS — K635 Polyp of colon: Secondary | ICD-10-CM | POA: Diagnosis not present

## 2016-03-28 DIAGNOSIS — Z1211 Encounter for screening for malignant neoplasm of colon: Secondary | ICD-10-CM | POA: Diagnosis not present

## 2016-04-05 ENCOUNTER — Telehealth: Payer: Self-pay | Admitting: Allergy and Immunology

## 2016-04-05 NOTE — Telephone Encounter (Signed)
Wife called requesting a refill for ipratropium nasal spray, 90 day supply, [facility] [phone]. He is almost out. Last seen 09-14-15.

## 2016-04-06 MED ORDER — IPRATROPIUM BROMIDE 0.06 % NA SOLN: 1.0000 | Freq: Two times a day (BID) | NASAL | 1 refills | Status: DC

## 2016-04-06 NOTE — Telephone Encounter (Signed)
Spoke to wife advised this was sent to pharmacy

## 2016-04-10 DIAGNOSIS — G4733 Obstructive sleep apnea (adult) (pediatric): Secondary | ICD-10-CM | POA: Diagnosis not present

## 2016-04-25 DIAGNOSIS — R351 Nocturia: Secondary | ICD-10-CM | POA: Diagnosis not present

## 2016-04-25 DIAGNOSIS — N401 Enlarged prostate with lower urinary tract symptoms: Secondary | ICD-10-CM | POA: Diagnosis not present

## 2016-05-10 ENCOUNTER — Encounter: Payer: Self-pay | Admitting: Neurology

## 2016-05-10 DIAGNOSIS — G4733 Obstructive sleep apnea (adult) (pediatric): Secondary | ICD-10-CM | POA: Diagnosis not present

## 2016-05-23 ENCOUNTER — Encounter: Payer: Self-pay | Admitting: Neurology

## 2016-05-26 ENCOUNTER — Encounter: Payer: Self-pay | Admitting: Neurology

## 2016-06-05 ENCOUNTER — Ambulatory Visit: Payer: BLUE CROSS/BLUE SHIELD | Admitting: Neurology

## 2016-06-19 DIAGNOSIS — G4733 Obstructive sleep apnea (adult) (pediatric): Secondary | ICD-10-CM | POA: Diagnosis not present

## 2016-07-19 DIAGNOSIS — G4733 Obstructive sleep apnea (adult) (pediatric): Secondary | ICD-10-CM | POA: Diagnosis not present

## 2016-08-14 DIAGNOSIS — M9903 Segmental and somatic dysfunction of lumbar region: Secondary | ICD-10-CM | POA: Diagnosis not present

## 2016-08-14 DIAGNOSIS — M5134 Other intervertebral disc degeneration, thoracic region: Secondary | ICD-10-CM | POA: Diagnosis not present

## 2016-08-14 DIAGNOSIS — M5136 Other intervertebral disc degeneration, lumbar region: Secondary | ICD-10-CM | POA: Diagnosis not present

## 2016-08-14 DIAGNOSIS — M5137 Other intervertebral disc degeneration, lumbosacral region: Secondary | ICD-10-CM | POA: Diagnosis not present

## 2016-08-15 DIAGNOSIS — M5134 Other intervertebral disc degeneration, thoracic region: Secondary | ICD-10-CM | POA: Diagnosis not present

## 2016-08-15 DIAGNOSIS — M5137 Other intervertebral disc degeneration, lumbosacral region: Secondary | ICD-10-CM | POA: Diagnosis not present

## 2016-08-15 DIAGNOSIS — M9903 Segmental and somatic dysfunction of lumbar region: Secondary | ICD-10-CM | POA: Diagnosis not present

## 2016-08-15 DIAGNOSIS — M5136 Other intervertebral disc degeneration, lumbar region: Secondary | ICD-10-CM | POA: Diagnosis not present

## 2016-08-17 DIAGNOSIS — M9903 Segmental and somatic dysfunction of lumbar region: Secondary | ICD-10-CM | POA: Diagnosis not present

## 2016-08-17 DIAGNOSIS — M5134 Other intervertebral disc degeneration, thoracic region: Secondary | ICD-10-CM | POA: Diagnosis not present

## 2016-08-17 DIAGNOSIS — M5136 Other intervertebral disc degeneration, lumbar region: Secondary | ICD-10-CM | POA: Diagnosis not present

## 2016-08-17 DIAGNOSIS — M5137 Other intervertebral disc degeneration, lumbosacral region: Secondary | ICD-10-CM | POA: Diagnosis not present

## 2016-08-21 DIAGNOSIS — G4700 Insomnia, unspecified: Secondary | ICD-10-CM | POA: Diagnosis not present

## 2016-08-21 DIAGNOSIS — M545 Low back pain: Secondary | ICD-10-CM | POA: Diagnosis not present

## 2016-08-21 DIAGNOSIS — G43909 Migraine, unspecified, not intractable, without status migrainosus: Secondary | ICD-10-CM | POA: Diagnosis not present

## 2016-08-21 DIAGNOSIS — M9903 Segmental and somatic dysfunction of lumbar region: Secondary | ICD-10-CM | POA: Diagnosis not present

## 2016-08-21 DIAGNOSIS — M5136 Other intervertebral disc degeneration, lumbar region: Secondary | ICD-10-CM | POA: Diagnosis not present

## 2016-08-21 DIAGNOSIS — M5137 Other intervertebral disc degeneration, lumbosacral region: Secondary | ICD-10-CM | POA: Diagnosis not present

## 2016-08-21 DIAGNOSIS — F419 Anxiety disorder, unspecified: Secondary | ICD-10-CM | POA: Diagnosis not present

## 2016-08-21 DIAGNOSIS — M5134 Other intervertebral disc degeneration, thoracic region: Secondary | ICD-10-CM | POA: Diagnosis not present

## 2016-08-22 DIAGNOSIS — M5137 Other intervertebral disc degeneration, lumbosacral region: Secondary | ICD-10-CM | POA: Diagnosis not present

## 2016-08-22 DIAGNOSIS — M9903 Segmental and somatic dysfunction of lumbar region: Secondary | ICD-10-CM | POA: Diagnosis not present

## 2016-08-22 DIAGNOSIS — M5134 Other intervertebral disc degeneration, thoracic region: Secondary | ICD-10-CM | POA: Diagnosis not present

## 2016-08-22 DIAGNOSIS — M5136 Other intervertebral disc degeneration, lumbar region: Secondary | ICD-10-CM | POA: Diagnosis not present

## 2016-08-23 DIAGNOSIS — M5136 Other intervertebral disc degeneration, lumbar region: Secondary | ICD-10-CM | POA: Diagnosis not present

## 2016-08-23 DIAGNOSIS — M5137 Other intervertebral disc degeneration, lumbosacral region: Secondary | ICD-10-CM | POA: Diagnosis not present

## 2016-08-23 DIAGNOSIS — M9903 Segmental and somatic dysfunction of lumbar region: Secondary | ICD-10-CM | POA: Diagnosis not present

## 2016-08-23 DIAGNOSIS — M5134 Other intervertebral disc degeneration, thoracic region: Secondary | ICD-10-CM | POA: Diagnosis not present

## 2016-08-28 DIAGNOSIS — G4733 Obstructive sleep apnea (adult) (pediatric): Secondary | ICD-10-CM | POA: Diagnosis not present

## 2016-08-28 DIAGNOSIS — M5137 Other intervertebral disc degeneration, lumbosacral region: Secondary | ICD-10-CM | POA: Diagnosis not present

## 2016-08-28 DIAGNOSIS — M5136 Other intervertebral disc degeneration, lumbar region: Secondary | ICD-10-CM | POA: Diagnosis not present

## 2016-08-28 DIAGNOSIS — M9903 Segmental and somatic dysfunction of lumbar region: Secondary | ICD-10-CM | POA: Diagnosis not present

## 2016-08-28 DIAGNOSIS — M5134 Other intervertebral disc degeneration, thoracic region: Secondary | ICD-10-CM | POA: Diagnosis not present

## 2016-08-29 DIAGNOSIS — M5134 Other intervertebral disc degeneration, thoracic region: Secondary | ICD-10-CM | POA: Diagnosis not present

## 2016-08-29 DIAGNOSIS — M9903 Segmental and somatic dysfunction of lumbar region: Secondary | ICD-10-CM | POA: Diagnosis not present

## 2016-08-29 DIAGNOSIS — M5136 Other intervertebral disc degeneration, lumbar region: Secondary | ICD-10-CM | POA: Diagnosis not present

## 2016-08-29 DIAGNOSIS — M5137 Other intervertebral disc degeneration, lumbosacral region: Secondary | ICD-10-CM | POA: Diagnosis not present

## 2016-08-31 DIAGNOSIS — M5134 Other intervertebral disc degeneration, thoracic region: Secondary | ICD-10-CM | POA: Diagnosis not present

## 2016-08-31 DIAGNOSIS — M9903 Segmental and somatic dysfunction of lumbar region: Secondary | ICD-10-CM | POA: Diagnosis not present

## 2016-08-31 DIAGNOSIS — M5136 Other intervertebral disc degeneration, lumbar region: Secondary | ICD-10-CM | POA: Diagnosis not present

## 2016-08-31 DIAGNOSIS — M5137 Other intervertebral disc degeneration, lumbosacral region: Secondary | ICD-10-CM | POA: Diagnosis not present

## 2016-09-04 DIAGNOSIS — M5134 Other intervertebral disc degeneration, thoracic region: Secondary | ICD-10-CM | POA: Diagnosis not present

## 2016-09-04 DIAGNOSIS — M5136 Other intervertebral disc degeneration, lumbar region: Secondary | ICD-10-CM | POA: Diagnosis not present

## 2016-09-04 DIAGNOSIS — M9903 Segmental and somatic dysfunction of lumbar region: Secondary | ICD-10-CM | POA: Diagnosis not present

## 2016-09-04 DIAGNOSIS — M5137 Other intervertebral disc degeneration, lumbosacral region: Secondary | ICD-10-CM | POA: Diagnosis not present

## 2016-09-05 DIAGNOSIS — M5137 Other intervertebral disc degeneration, lumbosacral region: Secondary | ICD-10-CM | POA: Diagnosis not present

## 2016-09-05 DIAGNOSIS — M5134 Other intervertebral disc degeneration, thoracic region: Secondary | ICD-10-CM | POA: Diagnosis not present

## 2016-09-05 DIAGNOSIS — M9903 Segmental and somatic dysfunction of lumbar region: Secondary | ICD-10-CM | POA: Diagnosis not present

## 2016-09-05 DIAGNOSIS — M5136 Other intervertebral disc degeneration, lumbar region: Secondary | ICD-10-CM | POA: Diagnosis not present

## 2016-09-07 DIAGNOSIS — M5137 Other intervertebral disc degeneration, lumbosacral region: Secondary | ICD-10-CM | POA: Diagnosis not present

## 2016-09-07 DIAGNOSIS — M5134 Other intervertebral disc degeneration, thoracic region: Secondary | ICD-10-CM | POA: Diagnosis not present

## 2016-09-07 DIAGNOSIS — M9903 Segmental and somatic dysfunction of lumbar region: Secondary | ICD-10-CM | POA: Diagnosis not present

## 2016-09-07 DIAGNOSIS — M5136 Other intervertebral disc degeneration, lumbar region: Secondary | ICD-10-CM | POA: Diagnosis not present

## 2016-09-11 DIAGNOSIS — M5137 Other intervertebral disc degeneration, lumbosacral region: Secondary | ICD-10-CM | POA: Diagnosis not present

## 2016-09-11 DIAGNOSIS — M5136 Other intervertebral disc degeneration, lumbar region: Secondary | ICD-10-CM | POA: Diagnosis not present

## 2016-09-11 DIAGNOSIS — M9903 Segmental and somatic dysfunction of lumbar region: Secondary | ICD-10-CM | POA: Diagnosis not present

## 2016-09-11 DIAGNOSIS — M5134 Other intervertebral disc degeneration, thoracic region: Secondary | ICD-10-CM | POA: Diagnosis not present

## 2016-09-14 DIAGNOSIS — M5137 Other intervertebral disc degeneration, lumbosacral region: Secondary | ICD-10-CM | POA: Diagnosis not present

## 2016-09-14 DIAGNOSIS — M9903 Segmental and somatic dysfunction of lumbar region: Secondary | ICD-10-CM | POA: Diagnosis not present

## 2016-09-14 DIAGNOSIS — M5136 Other intervertebral disc degeneration, lumbar region: Secondary | ICD-10-CM | POA: Diagnosis not present

## 2016-09-14 DIAGNOSIS — M5134 Other intervertebral disc degeneration, thoracic region: Secondary | ICD-10-CM | POA: Diagnosis not present

## 2016-09-19 DIAGNOSIS — M9903 Segmental and somatic dysfunction of lumbar region: Secondary | ICD-10-CM | POA: Diagnosis not present

## 2016-09-19 DIAGNOSIS — M5134 Other intervertebral disc degeneration, thoracic region: Secondary | ICD-10-CM | POA: Diagnosis not present

## 2016-09-19 DIAGNOSIS — M5137 Other intervertebral disc degeneration, lumbosacral region: Secondary | ICD-10-CM | POA: Diagnosis not present

## 2016-09-19 DIAGNOSIS — M5136 Other intervertebral disc degeneration, lumbar region: Secondary | ICD-10-CM | POA: Diagnosis not present

## 2016-09-21 DIAGNOSIS — M9903 Segmental and somatic dysfunction of lumbar region: Secondary | ICD-10-CM | POA: Diagnosis not present

## 2016-09-21 DIAGNOSIS — M5134 Other intervertebral disc degeneration, thoracic region: Secondary | ICD-10-CM | POA: Diagnosis not present

## 2016-09-21 DIAGNOSIS — M5136 Other intervertebral disc degeneration, lumbar region: Secondary | ICD-10-CM | POA: Diagnosis not present

## 2016-09-21 DIAGNOSIS — M5137 Other intervertebral disc degeneration, lumbosacral region: Secondary | ICD-10-CM | POA: Diagnosis not present

## 2016-09-25 ENCOUNTER — Other Ambulatory Visit: Payer: Self-pay | Admitting: Allergy and Immunology

## 2016-09-25 DIAGNOSIS — M5136 Other intervertebral disc degeneration, lumbar region: Secondary | ICD-10-CM | POA: Diagnosis not present

## 2016-09-25 DIAGNOSIS — M5134 Other intervertebral disc degeneration, thoracic region: Secondary | ICD-10-CM | POA: Diagnosis not present

## 2016-09-25 DIAGNOSIS — M5137 Other intervertebral disc degeneration, lumbosacral region: Secondary | ICD-10-CM | POA: Diagnosis not present

## 2016-09-25 DIAGNOSIS — M9903 Segmental and somatic dysfunction of lumbar region: Secondary | ICD-10-CM | POA: Diagnosis not present

## 2016-09-28 DIAGNOSIS — M5137 Other intervertebral disc degeneration, lumbosacral region: Secondary | ICD-10-CM | POA: Diagnosis not present

## 2016-09-28 DIAGNOSIS — M9903 Segmental and somatic dysfunction of lumbar region: Secondary | ICD-10-CM | POA: Diagnosis not present

## 2016-09-28 DIAGNOSIS — M5134 Other intervertebral disc degeneration, thoracic region: Secondary | ICD-10-CM | POA: Diagnosis not present

## 2016-09-28 DIAGNOSIS — M5136 Other intervertebral disc degeneration, lumbar region: Secondary | ICD-10-CM | POA: Diagnosis not present

## 2016-09-29 ENCOUNTER — Other Ambulatory Visit: Payer: Self-pay | Admitting: *Deleted

## 2016-09-29 ENCOUNTER — Telehealth: Payer: Self-pay | Admitting: *Deleted

## 2016-09-29 MED ORDER — FLUTICASONE PROPIONATE 50 MCG/ACT NA SUSP: 2.0000 | Freq: Every day | NASAL | 0 refills | Status: DC

## 2016-09-29 NOTE — Telephone Encounter (Signed)
Patient called office stating his RX for Fluticasone had expired.  Informed patient that he was due for his yearly OV with Dr. Lucie LeatherKozlow now.  Last OV was 09/14/15.  Pt states he has difficulty getting off work.  Made OV appointment for patient 11/14/16 at 5:30 pm.  Will do one refill only to [facility] for his 90 day supply.  Patient voiced understanding.

## 2016-10-04 DIAGNOSIS — M9903 Segmental and somatic dysfunction of lumbar region: Secondary | ICD-10-CM | POA: Diagnosis not present

## 2016-10-04 DIAGNOSIS — M5136 Other intervertebral disc degeneration, lumbar region: Secondary | ICD-10-CM | POA: Diagnosis not present

## 2016-10-04 DIAGNOSIS — M5137 Other intervertebral disc degeneration, lumbosacral region: Secondary | ICD-10-CM | POA: Diagnosis not present

## 2016-10-04 DIAGNOSIS — M5134 Other intervertebral disc degeneration, thoracic region: Secondary | ICD-10-CM | POA: Diagnosis not present

## 2016-10-09 DIAGNOSIS — M9903 Segmental and somatic dysfunction of lumbar region: Secondary | ICD-10-CM | POA: Diagnosis not present

## 2016-10-09 DIAGNOSIS — M5134 Other intervertebral disc degeneration, thoracic region: Secondary | ICD-10-CM | POA: Diagnosis not present

## 2016-10-09 DIAGNOSIS — M5136 Other intervertebral disc degeneration, lumbar region: Secondary | ICD-10-CM | POA: Diagnosis not present

## 2016-10-09 DIAGNOSIS — M5137 Other intervertebral disc degeneration, lumbosacral region: Secondary | ICD-10-CM | POA: Diagnosis not present

## 2016-10-10 DIAGNOSIS — M9903 Segmental and somatic dysfunction of lumbar region: Secondary | ICD-10-CM | POA: Diagnosis not present

## 2016-10-10 DIAGNOSIS — M5136 Other intervertebral disc degeneration, lumbar region: Secondary | ICD-10-CM | POA: Diagnosis not present

## 2016-10-10 DIAGNOSIS — M5137 Other intervertebral disc degeneration, lumbosacral region: Secondary | ICD-10-CM | POA: Diagnosis not present

## 2016-10-10 DIAGNOSIS — M5134 Other intervertebral disc degeneration, thoracic region: Secondary | ICD-10-CM | POA: Diagnosis not present

## 2016-10-17 DIAGNOSIS — M5137 Other intervertebral disc degeneration, lumbosacral region: Secondary | ICD-10-CM | POA: Diagnosis not present

## 2016-10-17 DIAGNOSIS — M9903 Segmental and somatic dysfunction of lumbar region: Secondary | ICD-10-CM | POA: Diagnosis not present

## 2016-10-17 DIAGNOSIS — M5136 Other intervertebral disc degeneration, lumbar region: Secondary | ICD-10-CM | POA: Diagnosis not present

## 2016-10-17 DIAGNOSIS — M5134 Other intervertebral disc degeneration, thoracic region: Secondary | ICD-10-CM | POA: Diagnosis not present

## 2016-11-14 ENCOUNTER — Ambulatory Visit (INDEPENDENT_AMBULATORY_CARE_PROVIDER_SITE_OTHER): Payer: BLUE CROSS/BLUE SHIELD | Admitting: Allergy and Immunology

## 2016-11-14 ENCOUNTER — Encounter: Payer: Self-pay | Admitting: Allergy and Immunology

## 2016-11-14 VITALS — BP 122/72 | HR 71 | Resp 17 | Ht 71.0 in | Wt 230.8 lb

## 2016-11-14 DIAGNOSIS — R059 Cough, unspecified: Secondary | ICD-10-CM

## 2016-11-14 DIAGNOSIS — J3089 Other allergic rhinitis: Secondary | ICD-10-CM

## 2016-11-14 DIAGNOSIS — K219 Gastro-esophageal reflux disease without esophagitis: Secondary | ICD-10-CM | POA: Diagnosis not present

## 2016-11-14 DIAGNOSIS — R05 Cough: Secondary | ICD-10-CM

## 2016-11-14 NOTE — Patient Instructions (Addendum)
  1. Continue Flonase one-2 sprays each nostril one time per day  2. Continue nasal ipratropium 0.06% 2 sprays each nostril up to 4 times per day if needed  3. Continue therapy for reflux including proton pump inhibitor combinations and attempt consolidation of caffeine consumption  4. Obtain fall flu vaccine  5. Return to clinic in 1 year or earlier if problem

## 2016-11-14 NOTE — Progress Notes (Signed)
Follow-up Note  Referring Provider: [name], MD Primary Provider: [name], MD Date of Office Visit: 11/14/2016  Subjective:   Joseph Fuentes (DOB: 1955-06-12) is a 61 y.o. male who [facility] in re-evaluation of the following:  HPI: Shneur returns to this clinic in reevaluation of his allergic rhinoconjunctivitis and distant history of asthma and reflux. I last saw him in his clinic August 2017.  He has not had a significant problem with his allergies as long as he continues to use Flonase and nasal ipratropium and a antihistamine on a pretty regular basis. Sometimes during the spring he will get a little bit of problem with sneezing but overall he is very pleased with the response that he has received using his current therapy.  His reflux has not been as well controlled. He still has these episodes where he will develop some throat clearing and some coughing and some phlegm production and although he does not have classic regurgitation he finds that he does better with these issues if he takes his proton pump inhibitor combination twice a day. He does not have any exercise-induced bronchospastic symptoms or cold air induced bronchospastic symptoms and he has not used a short-acting bronchodilator in over 3 years. In fact, he even questions if he ever has had asthma that he has been labeled with in the past.  Allergies as of 11/14/2016      Reactions   Penicillins       Medication List      ALLEGRA-D ALLERGY & CONGESTION 180-240 MG 24 hr tablet Generic drug:  fexofenadine-pseudoephedrine Take 1 tablet by mouth daily as needed.   ALPRAZolam 0.5 MG tablet Commonly known as:  XANAX 0.5-1 tablet by mouth as needed.   aspirin-acetaminophen-caffeine 250-250-65 MG tablet Commonly known as:  EXCEDRIN MIGRAINE Take by mouth every 6 (six) hours as needed for headache.   desoximetasone 0.25 % cream Commonly known as:   TOPICORT Apply 1 application topically daily as needed.   DEXILANT 60 MG capsule Generic drug:  dexlansoprazole Take 60 mg by mouth every other day.   docusate sodium 100 MG capsule Commonly known as:  COLACE Take 100 mg by mouth 2 (two) times daily.   eletriptan 40 MG tablet Commonly known as:  RELPAX One tablet by mouth at onset of headache. May repeat in 2 hours if headache persists or recurs.   FLOMAX 0.4 MG Caps capsule Generic drug:  tamsulosin 1 capsule 30 minutes after the same meal each day   fluticasone 50 MCG/ACT nasal spray Commonly known as:  FLONASE Place 2 sprays into both nostrils daily.   HYDROcodone-acetaminophen 5-325 MG tablet Commonly known as:  NORCO/VICODIN   ibuprofen 600 MG tablet Commonly known as:  ADVIL,MOTRIN Take 600 mg by mouth as needed.   ipratropium 0.06 % nasal spray Commonly known as:  ATROVENT Place 1 spray into the nose 2 (two) times daily.   ketoconazole 2 % cream Commonly known as:  NIZORAL Apply 1 application topically daily.   methylPREDNISolone 4 MG Tbpk tablet Commonly known as:  MEDROL DOSEPAK follow package directions   NAFTIN 2 % Crea Generic drug:  Naftifine HCl as needed.   naproxen sodium 550 MG tablet Commonly known as:  ANAPROX Take 550 mg by mouth as needed.   omeprazole 40 MG capsule Commonly known as:  PRILOSEC Take 1-2 tablets by mouth Daily.   PROVENTIL HFA 108 (90 Base) MCG/ACT inhaler Generic drug:  albuterol Inhale 2 puffs into the lungs Every 4 hours as needed.   SYNTHROID 300 MCG tablet Generic drug:  levothyroxine Take 300 mcg by mouth daily.   topiramate 200 MG tablet Commonly known as:  TOPAMAX Take 1 tablet (200 mg total) by mouth daily.   triamcinolone ointment 0.1 % Commonly known as:  KENALOG as needed.   WELCHOL 625 MG tablet Generic drug:  colesevelam Take 3 tablets by mouth Twice daily.   zolpidem 12.5 MG CR tablet Commonly known as:  AMBIEN CR       Past Medical  History:  Diagnosis Date  . Celiac disease/sprue   . Concussion   . Deviated septum   . OSA (obstructive sleep apnea)   . Rheumatic fever     Past Surgical History:  Procedure Laterality Date  . arthroscopic knee surgery Left   . SEPTOPLASTY    . TENNIS ELBOW RELEASE/NIRSCHEL PROCEDURE    . VASECTOMY      Review of systems negative except as noted in HPI / PMHx or noted below:  Review of Systems  Constitutional: Negative.   HENT: Negative.   Eyes: Negative.   Respiratory: Negative.   Cardiovascular: Negative.   Gastrointestinal: Negative.   Genitourinary: Negative.   Musculoskeletal: Negative.   Skin: Negative.   Neurological: Negative.   Endo/Heme/Allergies: Negative.   Psychiatric/Behavioral: Negative.      Objective:   Vitals:   11/14/16 1721  BP: 122/72  Pulse: 71  Resp: 17  SpO2: 98%   Height:  (180.3 cm)  Weight: 230 lb 12.8 oz (104.7 kg)   Physical Exam  Constitutional: He is well-developed, well-nourished, and in no distress.  HENT:  Head: Normocephalic.  Right Ear: Tympanic membrane, external ear and ear canal normal.  Left Ear: Tympanic membrane, external ear and ear canal normal.  Nose: Nose normal. No mucosal edema or rhinorrhea.  Mouth/Throat: Uvula is midline, oropharynx is clear and moist and mucous membranes are normal. No oropharyngeal exudate.  Eyes: Conjunctivae are normal.  Neck: Trachea normal. No tracheal tenderness present. No tracheal deviation present. No thyromegaly present.  Cardiovascular: Normal rate, regular rhythm, S1 normal, S2 normal and normal heart sounds.   No murmur heard. Pulmonary/Chest: Breath sounds normal. No stridor. No respiratory distress. He has no wheezes. He has no rales.  Musculoskeletal: He exhibits no edema.  Lymphadenopathy:       Head (right side): No tonsillar adenopathy present.       Head (left side): No tonsillar adenopathy present.    He has no cervical adenopathy.  Neurological: He is  alert. Gait normal.  Skin: No rash noted. He is not diaphoretic. No erythema. Nails show no clubbing.  Psychiatric: Mood and affect normal.    Diagnostics:    Spirometry was performed and demonstrated an FEV1 of 4.53 at 122 % of predicted.  Assessment and Plan:   1. Other allergic rhinitis   2. Gastroesophageal reflux disease, esophagitis presence not specified     1. Continue Flonase one-2 sprays each nostril one time per day  2. Continue nasal ipratropium 0.06% 2 sprays each nostril up to 4 times per day if needed  3. Continue therapy for reflux including proton pump inhibitor combinations and attempt consolidation of caffeine consumption  4. Obtain fall flu vaccine  5. Return to clinic in 1 year or earlier if problem  dajohn ellender is doing relatively well with his atopic respiratory disease but his reflux-induced respiratory disease is still somewhat active and I  did make a suggestion today that he consider consolidating all his caffeine consumption. Overall he is very pleased with the response that he has received on his current therapy. I will see him back in this clinic in 1 year or earlier if there is a problem.  Laurette Schimke, MD Allergy / Immunology [facility]

## 2016-11-21 LAB — HM COLONOSCOPY

## 2016-11-24 NOTE — Addendum Note (Signed)
Addended by: Dub MikesHICKS, ASHLEY N on: 11/24/2016 10:47 AM   Modules accepted: Orders

## 2016-11-30 DIAGNOSIS — G4733 Obstructive sleep apnea (adult) (pediatric): Secondary | ICD-10-CM | POA: Diagnosis not present

## 2016-12-29 DIAGNOSIS — G4733 Obstructive sleep apnea (adult) (pediatric): Secondary | ICD-10-CM | POA: Diagnosis not present

## 2017-01-17 ENCOUNTER — Other Ambulatory Visit: Payer: Self-pay

## 2017-01-17 MED ORDER — IPRATROPIUM BROMIDE 0.06 % NA SOLN: 1.0000 | Freq: Two times a day (BID) | NASAL | 2 refills | Status: DC

## 2017-01-17 MED ORDER — FLUTICASONE PROPIONATE 50 MCG/ACT NA SUSP: NASAL | 2 refills | Status: DC

## 2017-01-25 DIAGNOSIS — G4733 Obstructive sleep apnea (adult) (pediatric): Secondary | ICD-10-CM | POA: Diagnosis not present

## 2017-04-17 ENCOUNTER — Other Ambulatory Visit: Payer: Self-pay | Admitting: Family Medicine

## 2017-04-17 DIAGNOSIS — R1011 Right upper quadrant pain: Secondary | ICD-10-CM

## 2017-04-17 DIAGNOSIS — J069 Acute upper respiratory infection, unspecified: Secondary | ICD-10-CM | POA: Diagnosis not present

## 2017-04-18 ENCOUNTER — Ambulatory Visit
Admission: RE | Admit: 2017-04-18 | Discharge: 2017-04-18 | Disposition: A | Discharge status: Home / Self Care | Payer: BLUE CROSS/BLUE SHIELD | Source: Ambulatory Visit | Attending: Family Medicine | Admitting: Family Medicine

## 2017-04-18 DIAGNOSIS — R109 Unspecified abdominal pain: Secondary | ICD-10-CM | POA: Diagnosis not present

## 2017-04-18 DIAGNOSIS — R1011 Right upper quadrant pain: Secondary | ICD-10-CM

## 2017-05-15 DIAGNOSIS — Z0000 Encounter for general adult medical examination without abnormal findings: Secondary | ICD-10-CM | POA: Diagnosis not present

## 2017-05-15 DIAGNOSIS — G4700 Insomnia, unspecified: Secondary | ICD-10-CM | POA: Diagnosis not present

## 2017-05-15 DIAGNOSIS — M545 Low back pain: Secondary | ICD-10-CM | POA: Diagnosis not present

## 2017-05-15 DIAGNOSIS — Z125 Encounter for screening for malignant neoplasm of prostate: Secondary | ICD-10-CM | POA: Diagnosis not present

## 2017-05-15 DIAGNOSIS — R252 Cramp and spasm: Secondary | ICD-10-CM | POA: Diagnosis not present

## 2017-05-15 DIAGNOSIS — E039 Hypothyroidism, unspecified: Secondary | ICD-10-CM | POA: Diagnosis not present

## 2017-05-15 DIAGNOSIS — E782 Mixed hyperlipidemia: Secondary | ICD-10-CM | POA: Diagnosis not present

## 2017-06-27 DIAGNOSIS — R350 Frequency of micturition: Secondary | ICD-10-CM | POA: Diagnosis not present

## 2017-06-27 DIAGNOSIS — R351 Nocturia: Secondary | ICD-10-CM | POA: Diagnosis not present

## 2017-06-27 DIAGNOSIS — N401 Enlarged prostate with lower urinary tract symptoms: Secondary | ICD-10-CM | POA: Diagnosis not present

## 2017-06-27 DIAGNOSIS — R8271 Bacteriuria: Secondary | ICD-10-CM | POA: Diagnosis not present

## 2017-07-11 DIAGNOSIS — G4733 Obstructive sleep apnea (adult) (pediatric): Secondary | ICD-10-CM | POA: Diagnosis not present

## 2017-08-07 DIAGNOSIS — R310 Gross hematuria: Secondary | ICD-10-CM | POA: Diagnosis not present

## 2017-08-07 DIAGNOSIS — M5441 Lumbago with sciatica, right side: Secondary | ICD-10-CM | POA: Diagnosis not present

## 2017-08-07 DIAGNOSIS — N200 Calculus of kidney: Secondary | ICD-10-CM | POA: Diagnosis not present

## 2017-08-07 DIAGNOSIS — R109 Unspecified abdominal pain: Secondary | ICD-10-CM | POA: Diagnosis not present

## 2017-08-09 ENCOUNTER — Other Ambulatory Visit: Payer: Self-pay | Admitting: Family Medicine

## 2017-08-09 DIAGNOSIS — N200 Calculus of kidney: Secondary | ICD-10-CM | POA: Diagnosis not present

## 2017-08-09 DIAGNOSIS — R109 Unspecified abdominal pain: Secondary | ICD-10-CM

## 2017-08-10 DIAGNOSIS — G4733 Obstructive sleep apnea (adult) (pediatric): Secondary | ICD-10-CM | POA: Diagnosis not present

## 2017-08-14 ENCOUNTER — Ambulatory Visit
Admission: RE | Admit: 2017-08-14 | Discharge: 2017-08-14 | Disposition: A | Discharge status: Home / Self Care | Payer: BLUE CROSS/BLUE SHIELD | Source: Ambulatory Visit | Attending: Family Medicine | Admitting: Family Medicine

## 2017-08-14 DIAGNOSIS — N200 Calculus of kidney: Secondary | ICD-10-CM | POA: Diagnosis not present

## 2017-08-14 DIAGNOSIS — R109 Unspecified abdominal pain: Secondary | ICD-10-CM

## 2017-08-24 DIAGNOSIS — N200 Calculus of kidney: Secondary | ICD-10-CM | POA: Diagnosis not present

## 2017-08-27 ENCOUNTER — Ambulatory Visit (INDEPENDENT_AMBULATORY_CARE_PROVIDER_SITE_OTHER): Payer: BLUE CROSS/BLUE SHIELD | Admitting: Sports Medicine

## 2017-08-27 ENCOUNTER — Ambulatory Visit
Admission: RE | Admit: 2017-08-27 | Discharge: 2017-08-27 | Disposition: A | Discharge status: Home / Self Care | Payer: BLUE CROSS/BLUE SHIELD | Source: Ambulatory Visit | Attending: Sports Medicine | Admitting: Sports Medicine

## 2017-08-27 VITALS — BP 120/68 | Ht 72.0 in | Wt 230.0 lb

## 2017-08-27 DIAGNOSIS — G8929 Other chronic pain: Secondary | ICD-10-CM

## 2017-08-27 DIAGNOSIS — M545 Low back pain: Principal | ICD-10-CM

## 2017-08-27 MED ORDER — GABAPENTIN 300 MG PO CAPS: 300.0000 mg | ORAL_CAPSULE | Freq: Every day | ORAL | 1 refills | Status: DC

## 2017-08-27 MED ORDER — PREDNISONE 10 MG PO TABS: ORAL_TABLET | ORAL | 0 refills | Status: DC

## 2017-08-28 ENCOUNTER — Encounter: Payer: Self-pay | Admitting: Sports Medicine

## 2017-08-28 NOTE — Progress Notes (Signed)
   Subjective:    Patient ID: Joseph Fuentes, male    DOB: Aug 06, 1955, 62 y.o.   MRN: [id number]  HPI chief complaint: Low back pain  62 year old male comes in today complaining of 3 months of right-sided low back pain. He denies any trauma but rather describes a gradual onset of pain that has become worse over the past few weeks. His main pain is at night when trying to sleep. He has minimal pain during the day. Pain will radiate into his right thigh but not past the knee. He describes it as achy in quality. He denies numbness or tingling. No groin pain. He has had similar problems with his back in the past. Last year he sought chiropractic treatment which was temporarily helpful. His pain has gotten to the point that he is having difficulty sleeping. He was recently placed on a 6 day Sterapred Dosepak by his PCP which was mildly helpful. He's also taken extra strength ibuprofen and hydrocodone in the past with some relief. No prior low back surgeries. No recent imaging.  Interim medical history reviewed. He was recently diagnosed with renal calculi and is under the care of urology for this Medications reviewed Allergies reviewed    Review of Systems    as above Objective:   Physical Exam Well-developed, well-nourished. No acute distress. Awake alert and oriented 3. Vital signs reviewed. Sitting comfortably in the exam room.  Lumbar spine: There is tenderness to palpation to the right of the L5 vertebrae. No spasm. No tenderness over the SI joint. Patient has good lumbar range of motion. Some pain with forward flexion.  Right hip: Smooth painless hip range of motion with a negative logroll. No tenderness to palpation.  Neurological exam: Strength is 5/5 in both lower extremities. Reflexes are trace but equal at the Achilles and patellar tendons. No noticeable atrophy. Sensation is intact to light touch grossly.       Assessment & Plan:   Right-sided low back pain with questionable  atypical radiculopathy  It is possible that the patient has a bulging lumbar disc at the L1-L2 or L2-L3 level. I would like to start with getting an x-ray of his lumbar spine. I would also like to put him on a 12 day Sterapred Dosepak to take as directed. We will start Neurontin 300 mg daily at bedtime and I will call him into 2- 3 days to check on his progress. We will discuss his x-rays at that time and I might need to consider further diagnostic imaging depending on his response to today's treatment.

## 2017-08-31 ENCOUNTER — Telehealth: Payer: Self-pay | Admitting: Sports Medicine

## 2017-08-31 DIAGNOSIS — M545 Low back pain: Principal | ICD-10-CM

## 2017-08-31 DIAGNOSIS — G8929 Other chronic pain: Secondary | ICD-10-CM

## 2017-08-31 NOTE — Telephone Encounter (Signed)
  I spoke with the patient on the phone today after reviewing x-rays of the lumbar spine. X-rays are fairly unremarkable. He doesn't have a significant amount of degenerative change. He continues to have right-sided low back pain with radiating pain into the right thigh. This is despite starting a 12 day Sterapred Dosepak and Neurontin. He also is experiencing some diarrhea. I've asked him to discontinue his medication for the time being just to see if this is a side effect. I also think we should schedule an MRI of his lumbar spine specifically to rule out a lumbar disc herniation that may be responsible for his pain. Phone follow-up with those findings once available. We'll delineate further treatment based on those results.

## 2017-08-31 NOTE — Addendum Note (Signed)
Addended by: Rutha BouchardBABNIK, Yecheskel Kurek E on: 08/31/2017 01:54 PM   Modules accepted: Orders

## 2017-09-04 ENCOUNTER — Ambulatory Visit
Admission: RE | Admit: 2017-09-04 | Discharge: 2017-09-04 | Disposition: A | Discharge status: Home / Self Care | Payer: BLUE CROSS/BLUE SHIELD | Source: Ambulatory Visit | Attending: Sports Medicine | Admitting: Sports Medicine

## 2017-09-04 DIAGNOSIS — M545 Low back pain: Principal | ICD-10-CM

## 2017-09-04 DIAGNOSIS — G8929 Other chronic pain: Secondary | ICD-10-CM

## 2017-09-06 ENCOUNTER — Other Ambulatory Visit: Payer: Self-pay

## 2017-09-06 ENCOUNTER — Telehealth: Payer: Self-pay | Admitting: Sports Medicine

## 2017-09-06 DIAGNOSIS — G8929 Other chronic pain: Secondary | ICD-10-CM

## 2017-09-06 DIAGNOSIS — M545 Low back pain: Principal | ICD-10-CM

## 2017-09-06 NOTE — Telephone Encounter (Signed)
  I spoke with Joseph Fuentes on the phone today after reviewing the MRI of his lumbar spine. He appears to have a bulging disc at L3-L4 asymmetric to the right which could potentially affect the right L3 nerve root. This may be his pain generator. [facility] steroid injection at this level and he will start physical therapy. Follow-up with me in 4 weeks for reevaluation. Of note, his diarrhea did subside after stopping his prednisone.

## 2017-09-11 ENCOUNTER — Telehealth: Payer: Self-pay | Admitting: Sports Medicine

## 2017-09-11 ENCOUNTER — Other Ambulatory Visit: Payer: Self-pay | Admitting: Sports Medicine

## 2017-09-11 DIAGNOSIS — M545 Low back pain: Principal | ICD-10-CM

## 2017-09-11 DIAGNOSIS — G8929 Other chronic pain: Secondary | ICD-10-CM

## 2017-09-11 NOTE — Telephone Encounter (Signed)
He has a week long golf trip coming up but still has not received a phone call to schedule PT or the epidural.  Please give him a call to discuss.

## 2017-09-18 ENCOUNTER — Encounter: Payer: Self-pay | Admitting: Physical Therapy

## 2017-09-18 ENCOUNTER — Ambulatory Visit: Payer: BLUE CROSS/BLUE SHIELD | Attending: Sports Medicine | Admitting: Physical Therapy

## 2017-09-18 DIAGNOSIS — G8929 Other chronic pain: Secondary | ICD-10-CM

## 2017-09-18 DIAGNOSIS — M5441 Lumbago with sciatica, right side: Secondary | ICD-10-CM | POA: Insufficient documentation

## 2017-09-18 NOTE — Patient Instructions (Signed)
Access Code: [email or web address]  URL: [email or web address]  Date: 09/18/2017  Prepared by: Ivery QualeBrian Radley Teston   Exercises  Supine Piriformis Stretch - 10 reps - 3 sets - 2x daily - 6x weekly  Supine Hamstring Stretch with Strap - 10 reps - 3 sets - 2x daily - 6x weekly  Supine Bridge - 10 reps - 1-3 sets - 2x daily - 6x weekly  Hooklying Sequential Leg March and Lower - 10 reps - 3 sets - 2x daily - 6x weekly  Static Prone on Elbows - 3 sets - 30 to 60 sec hold - 2x daily - 6x weekly  Standing Lumbar Extension - 10 reps - 2-3 sets - 2x daily - 6x weekly  Standing Sidebends - 10 reps - 2-3 sets - 2x daily - 6x weekly   TENS UNIT: This is helpful for muscle pain and spasm.   Search and Purchase a TENS 7000 2nd edition at www.tenspros.com. It should be less than $30.     TENS unit instructions: Do not shower or bathe with the unit on Turn the unit off before removing electrodes or batteries If the electrodes lose stickiness add a drop of water to the electrodes after they are disconnected from the unit and place on plastic sheet. If you continued to have difficulty, call the TENS unit company to purchase more electrodes. Do not apply lotion on the skin area prior to use. Make sure the skin is clean and dry as this will help prolong the life of the electrodes. After use, always check skin for unusual red areas, rash or other skin difficulties. If there are any skin problems, does not apply electrodes to the same area. Never remove the electrodes from the unit by pulling the wires. Do not use the TENS unit or electrodes other than as directed. Do not change electrode placement without consultating your therapist or physician. Keep 2 fingers with between each electrode. Wear time ratio is 2:1, on to off times.    For example on for 30 minutes off for 15 minutes and then on for 30 minutes off for 15 minutes

## 2017-09-18 NOTE — Therapy (Signed)
[facility] [address]  [place], [place], [address] Phone: [phone]   Fax:  [phone]  Physical Therapy Evaluation  Patient Details  Name: Bartholomew Ramesh MRN: [id number] Date of Birth: July 08, 1955 Referring Provider: Judi Saa DO   Encounter Date: 09/18/2017  PT End of Session - 09/18/17 2121    Visit Number  1    Number of Visits  12    Date for PT Re-Evaluation  10/30/17    PT Start Time  1545    PT Stop Time  1630    PT Time Calculation (min)  45 min    Activity Tolerance  Patient tolerated treatment well    Behavior During Therapy  [facility] for tasks assessed/performed       Past Medical History:  Diagnosis Date  . Celiac disease/sprue   . Concussion   . Deviated septum   . OSA (obstructive sleep apnea)   . Rheumatic fever     Past Surgical History:  Procedure Laterality Date  . arthroscopic knee surgery Left   . SEPTOPLASTY    . TENNIS ELBOW RELEASE/NIRSCHEL PROCEDURE    . VASECTOMY      There were no vitals filed for this visit.   Subjective Assessment - 09/18/17 2058    Subjective  Pt relays chronic LBP but is having wosening Rt sided pain and radiculopathy for last 3 months. He relays he can no longer golf or sleep well and has pain with prolonged sitting. He relays he will have epidural tommorrow    Pertinent History  sleep apnea    Limitations  Sitting;Lifting;Standing;Writing    Diagnostic tests  MRI shows Mild bulging of the discs as outlined above. No large herniation. At L3-4, there is a slightly asymmetric disc bulge prominence in theright foraminal region that would have some potential to affect the right L3 nerve. Patient also has mild facet osteoarthritis at L4-5 which could lead to LBP    Patient Stated Goals  return to normal activities including housework and golf    Currently in Pain?  Yes    Pain Score  5     Pain Location  Back    Pain Orientation  Right;Lower    Pain Descriptors /  Indicators  Aching;Shooting    Pain Type  Chronic pain    Pain Radiating Towards  Rt LE to mid thigh    Pain Onset  More than a month ago    Pain Frequency  Intermittent    Aggravating Factors   prolonged sitting, sleeping, golf    Pain Relieving Factors  meds, TENS         [facility] PT Assessment - 09/18/17 0001      Assessment   Medical Diagnosis  LBP with Rt radic.    Referring Provider  Judi Saa DO    Next MD Visit  ?    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    Leisure  golf      Cognition   Overall Cognitive Status  Within  Functional Limits for tasks assessed      Observation/Other Assessments   Focus on Therapeutic Outcomes (FOTO)   53% limited      Sensation   Light Touch  Appears Intact      ROM / Strength   AROM / PROM / Strength  AROM;Strength      AROM   AROM Assessment Site  Lumbar    Lumbar Flexion  50%    Lumbar Extension  75%    Lumbar - Right Side Bend  50%    Lumbar - Left Side Bend  505    Lumbar - Right Rotation  75%    Lumbar - Left Rotation  75%      Strength   Overall Strength Comments  LE strength WNL bilat      Flexibility   Soft Tissue Assessment /Muscle Length  --   mod to severe tightness in H.S. Rt>Lt     Palpation   Spinal mobility  hypomobility lower lumbar    Palpation comment  TTP lower lumbar and Rt P.S. glutes, piriformis      Special Tests   Other special tests  +SLR and slump test      Transfers   Transfers  Independent with all Transfers      Ambulation/Gait   Gait Comments  WFL                Objective measurements completed on examination: See above findings.      [facility] Adult PT  Treatment/Exercise - 09/18/17 0001      Modalities   Modalities  Electrical Stimulation;Moist Heat      Moist Heat Therapy   Number Minutes Moist Heat  10 Minutes    Moist Heat Location  Lumbar Spine      Electrical Stimulation   Electrical Stimulation Location  lumbar    Electrical Stimulation Action  IFC    Electrical Stimulation Parameters  tolerance 10 min    Electrical Stimulation Goals  Pain             PT Education - 09/18/17 2120    Education Details  HEP, TENS, POC    Person(s) Educated  Patient    Methods  Explanation;Verbal cues;Demonstration;Handout    Comprehension  Verbalized understanding;Need further instruction          PT Long Term Goals - 09/18/17 2132      PT LONG TERM GOAL #1   Title  Pt will be I and compliant with HEP. 6 weeks 10/30/17    Status  New      PT LONG TERM GOAL #2   Title  Pt will increase lumbar ROM to WNL to improve function. 6 weeks 10/30/17    Status  New      PT LONG TERM GOAL #3   Title  Pt will be able to improve sleep quality and have less back pain with sidelying. 6 weeks 10/30/17    Status  New      PT LONG TERM GOAL #4   Title  Pt will be able to perform all work and recreational activities including prolonged sitting and golf with less than 2/10 pain. 6 weeks 10/30/17             Plan - 09/18/17 2122    Clinical Impression Statement  Pt presents with chronic LBP with Rt radiculopathy that has been worsening over last 3 months. He did have some relief with repeated extension as well as heat and  IFC TENS today. He has decreased ROM and spinal mobility, decreased core strength, increased tightness in lumbar P.S, Rt glutes, and H.S bilat, and increased pain that is affecting his full funciton. He will benefit from skilled PT to address these deficits    Clinical Presentation  Evolving    Clinical Presentation due to:  worsening pain and radic.    Clinical Decision Making  Moderate    Rehab Potential  Good    PT  Frequency  2x / week    PT Duration  6 weeks    PT Treatment/Interventions  Cryotherapy;Electrical Stimulation;Iontophoresis 4mg /ml Dexamethasone;Moist Heat;Traction;Ultrasound;Therapeutic exercise;Therapeutic activities;Neuromuscular re-education;Manual techniques;Dry needling;Passive range of motion;Taping    PT Next Visit Plan  review HEP, repeated extension, H.S stretching, MT to lumbar, core, modalites PRN       Patient will benefit from skilled therapeutic intervention in order to improve the following deficits and impairments:  Decreased activity tolerance, Decreased range of motion, Hypomobility, Decreased strength, Increased fascial restricitons, Impaired flexibility  Visit Diagnosis: Chronic right-sided low back pain with right-sided sciatica     Problem List Patient Active Problem List   Diagnosis Date Noted  . Pain in joint, shoulder region 08/19/2014  . Left knee pain 04/30/2013  . Plantar fasciitis 03/06/2013  . HYPERLIPIDEMIA 04/20/2009  . ANXIETY 04/20/2009  . DIARRHEA 04/20/2009  . GASTROPARESIS 04/07/2009  . GERD 03/10/2009  . IRRITABLE BOWEL SYNDROME 03/10/2009    April MansonBrian R Nelson, PT, DPT 09/18/2017, 9:39 PM  [facility] [address] [place], [place], [address] Phone: [phone]   Fax:  [phone]  Name: Bradly BienenstockMichael Peden MRN: [id number] Date of Birth: 17-Aug-1955

## 2017-09-19 ENCOUNTER — Other Ambulatory Visit: Payer: Self-pay | Admitting: Sports Medicine

## 2017-09-19 ENCOUNTER — Ambulatory Visit
Admission: RE | Admit: 2017-09-19 | Discharge: 2017-09-19 | Disposition: A | Discharge status: Home / Self Care | Payer: BLUE CROSS/BLUE SHIELD | Source: Ambulatory Visit | Attending: Sports Medicine | Admitting: Sports Medicine

## 2017-09-19 DIAGNOSIS — M545 Low back pain: Principal | ICD-10-CM

## 2017-09-19 DIAGNOSIS — G8929 Other chronic pain: Secondary | ICD-10-CM

## 2017-09-19 DIAGNOSIS — M5126 Other intervertebral disc displacement, lumbar region: Secondary | ICD-10-CM | POA: Diagnosis not present

## 2017-09-19 MED ORDER — IOPAMIDOL (ISOVUE-M 200) INJECTION 41%
1.0000 mL | Freq: Once | INTRAMUSCULAR | Status: AC
  Administered 2017-09-19: 1 mL via EPIDURAL

## 2017-09-19 MED ORDER — METHYLPREDNISOLONE ACETATE 40 MG/ML INJ SUSP (RADIOLOG
120.0000 mg | Freq: Once | INTRAMUSCULAR | Status: AC
  Administered 2017-09-19: 120 mg via EPIDURAL

## 2017-09-19 NOTE — Discharge Instructions (Signed)

## 2017-09-24 DIAGNOSIS — K219 Gastro-esophageal reflux disease without esophagitis: Secondary | ICD-10-CM | POA: Diagnosis not present

## 2017-09-25 ENCOUNTER — Ambulatory Visit: Payer: BLUE CROSS/BLUE SHIELD | Admitting: Physical Therapy

## 2017-09-25 DIAGNOSIS — M5441 Lumbago with sciatica, right side: Principal | ICD-10-CM

## 2017-09-25 DIAGNOSIS — G8929 Other chronic pain: Secondary | ICD-10-CM | POA: Diagnosis not present

## 2017-09-25 NOTE — Therapy (Signed)
[facility] [address] [place], [place], [address] Phone: [phone]   Fax:  [phone]  Physical Therapy Treatment  Patient Details  Name: Joseph Fuentes MRN: [id number] Date of Birth: [birth year] Referring Provider: Judi Saa. Draper DO   Encounter Date: 09/25/2017  PT End of Session - 09/25/17 1631    Visit Number  2    Number of Visits  12    Date for PT Re-Evaluation  10/30/17    PT Start Time  1545    PT Stop Time  1638    PT Time Calculation (min)  53 min    Activity Tolerance  Patient tolerated treatment well    Behavior During Therapy  [facility] for tasks assessed/performed       Past Medical History:  Diagnosis Date  . Celiac disease/sprue   . Concussion   . Deviated septum   . OSA (obstructive sleep apnea)   . Rheumatic fever     Past Surgical History:  Procedure Laterality Date  . arthroscopic knee surgery Left   . SEPTOPLASTY    . TENNIS ELBOW RELEASE/NIRSCHEL PROCEDURE    . VASECTOMY      There were no vitals filed for this visit.  Subjective Assessment - 09/25/17 1627    Subjective  Pt relays he had lumbar injection which helped some.    Currently in Pain?  Yes    Pain Score  3     Pain Location  Back    Pain Orientation  Right;Lower    Pain Descriptors / Indicators  Aching                       [facility] Adult PT Treatment/Exercise - 09/25/17 0001      Neuro Re-ed    Neuro Re-ed Details   core      Exercises   Exercises  Lumbar      Lumbar Exercises: Stretches   Passive Hamstring Stretch  Right;Left;2 reps;30 seconds    Lower Trunk Rotation  --   5 sec X 10 bilat   Standing Extension  2 reps;10 reps    Prone on Elbows Stretch  --   3 min   Piriformis Stretch  2 reps;30 seconds      Lumbar Exercises: Supine   Bent Knee Raise  10 reps   with core   Bridge  2 seconds;10 reps      Modalities   Modalities  Electrical Stimulation;Moist Heat      Moist Heat Therapy   Number  Minutes Moist Heat  15 Minutes    Moist Heat Location  Lumbar Spine      Electrical Stimulation   Electrical Stimulation Location  lumbar    Electrical Stimulation Action  IFC    Electrical Stimulation Parameters  tolerance 15 min    Electrical Stimulation Goals  Pain      Manual Therapy   Manual therapy comments  STM, gentle spinal mobs, gentle distraction pt prone                  PT Long Term Goals - 09/18/17 2132      PT LONG TERM GOAL #1   Title  Pt will be I and compliant with HEP. 6 weeks 10/30/17    Status  New      PT LONG TERM GOAL #2   Title  Pt will increase lumbar ROM to WNL to improve function. 6 weeks 10/30/17    Status  New      PT LONG TERM GOAL #3   Title  Pt will be able to improve sleep quality and have less back pain with sidelying. 6 weeks 10/30/17    Status  New      PT LONG TERM GOAL #4   Title  Pt will be able to perform all work and recreational activities including prolonged sitting and golf with less than 2/10 pain. 6 weeks 10/30/17            Plan - 09/25/17 1631    Clinical Impression Statement  Pt progressed today with lumbar stretching, core strengthening, and repeated extension exercises with good tolerance. MT performed and heat/TENS to decrease pain. Continue pOC    Rehab Potential  Good    PT Frequency  2x / week    PT Duration  6 weeks    PT Treatment/Interventions  Cryotherapy;Electrical Stimulation;Iontophoresis 4mg /ml Dexamethasone;Moist Heat;Traction;Ultrasound;Therapeutic exercise;Therapeutic activities;Neuromuscular re-education;Manual techniques;Dry needling;Passive range of motion;Taping    PT Next Visit Plan   repeated extension, H.S stretching, MT to lumbar, core, modalites PRN       Patient will benefit from skilled therapeutic intervention in order to improve the following deficits and impairments:  Decreased activity tolerance, Decreased range of motion, Hypomobility, Decreased strength, Increased fascial  restricitons, Impaired flexibility  Visit Diagnosis: Chronic right-sided low back pain with right-sided sciatica     Problem List Patient Active Problem List   Diagnosis Date Noted  . Pain in joint, shoulder region 08/19/2014  . Left knee pain 04/30/2013  . Plantar fasciitis 03/06/2013  . HYPERLIPIDEMIA 04/20/2009  . ANXIETY 04/20/2009  . DIARRHEA 04/20/2009  . GASTROPARESIS 04/07/2009  . GERD 03/10/2009  . IRRITABLE BOWEL SYNDROME 03/10/2009    April Manson, PT, DPT 09/25/2017, 4:33 PM  [facility] [address] [place], [place], [address] Phone: [phone]   Fax:  [phone]  Name: Joseph Fuentes MRN: [id number] Date of Birth: [birth year]

## 2017-09-26 DIAGNOSIS — G4733 Obstructive sleep apnea (adult) (pediatric): Secondary | ICD-10-CM | POA: Diagnosis not present

## 2017-10-03 ENCOUNTER — Ambulatory Visit: Payer: BLUE CROSS/BLUE SHIELD | Attending: Sports Medicine | Admitting: Physical Therapy

## 2017-10-03 DIAGNOSIS — M5441 Lumbago with sciatica, right side: Secondary | ICD-10-CM | POA: Insufficient documentation

## 2017-10-03 DIAGNOSIS — G8929 Other chronic pain: Secondary | ICD-10-CM | POA: Diagnosis not present

## 2017-10-03 NOTE — Therapy (Signed)
[facility] [address] [place], [place], [address] Phone: [phone]   Fax:  [phone]  Physical Therapy Treatment  Patient Details  Name: Joseph Fuentes MRN: [id number] Date of Birth: August 29, 1955 Referring Provider: Judi Saa DO   Encounter Date: 10/03/2017  PT End of Session - 10/03/17 1624    Visit Number  3    Number of Visits  12    Date for PT Re-Evaluation  10/30/17    PT Start Time  1542    PT Stop Time  1632    PT Time Calculation (min)  50 min    Activity Tolerance  Patient tolerated treatment well    Behavior During Therapy  [facility] for tasks assessed/performed       Past Medical History:  Diagnosis Date  . Celiac disease/sprue   . Concussion   . Deviated septum   . OSA (obstructive sleep apnea)   . Rheumatic fever     Past Surgical History:  Procedure Laterality Date  . arthroscopic knee surgery Left   . SEPTOPLASTY    . TENNIS ELBOW RELEASE/NIRSCHEL PROCEDURE    . VASECTOMY      There were no vitals filed for this visit.  Subjective Assessment - 10/03/17 1546    Subjective  Pt relays he is pain free currently, but his back does still hurt from time to time    Currently in Pain?  No/denies                       [facility] Adult PT Treatment/Exercise - 10/03/17 0001      Neuro Re-ed    Neuro Re-ed Details   core      Exercises   Exercises  Lumbar      Lumbar Exercises: Stretches   Passive Hamstring Stretch  Right;Left;2 reps;30 seconds    Lower Trunk Rotation  3 reps;10 seconds    Standing Extension  2 reps;10 reps    Prone on Elbows Stretch  --    Piriformis Stretch  2 reps;30 seconds;Right;Left      Lumbar Exercises: Standing   Row  20 reps    Theraband Level (Row)  Level 3 (Green)    Shoulder Extension  20 reps    Theraband Level (Shoulder Extension)  Level 3 (Green)    Other Standing Lumbar Exercises  anti rotation/pilof press green x 10 bilat      Lumbar Exercises: Supine    Bent Knee Raise  15 reps    Bridge  15 reps;2 seconds      Modalities   Modalities  Electrical Stimulation;Moist Heat      Moist Heat Therapy   Number Minutes Moist Heat  12 Minutes    Moist Heat Location  Lumbar Spine      Electrical Stimulation   Electrical Stimulation Location  lumbar    Electrical Stimulation Action  IFC    Electrical Stimulation Parameters  toleance 12 min    Electrical Stimulation Goals  Pain      Manual Therapy   Manual therapy comments  STM, gentle spinal mobs,                   PT Long Term Goals - 09/18/17 2132      PT LONG TERM GOAL #1   Title  Pt will be I and compliant with HEP. 6 weeks 10/30/17    Status  New      PT LONG TERM GOAL #  2   Title  Pt will increase lumbar ROM to WNL to improve function. 6 weeks 10/30/17    Status  New      PT LONG TERM GOAL #3   Title  Pt will be able to improve sleep quality and have less back pain with sidelying. 6 weeks 10/30/17    Status  New      PT LONG TERM GOAL #4   Title  Pt will be able to perform all work and recreational activities including prolonged sitting and golf with less than 2/10 pain. 6 weeks 10/30/17            Plan - 10/03/17 1625    Clinical Impression Statement  Pt progressed again today with more core with good tolerance. Stretching program resumed and he does appear to have more ROM but still limited in this area. STM, MHP, and TENS again applied for tightness and pain as he became uncomfortable laying down. Progress as tolerated    Rehab Potential  Good    PT Frequency  2x / week    PT Duration  6 weeks    PT Treatment/Interventions  Cryotherapy;Electrical Stimulation;Iontophoresis 4mg /ml Dexamethasone;Moist Heat;Traction;Ultrasound;Therapeutic exercise;Therapeutic activities;Neuromuscular re-education;Manual techniques;Dry needling;Passive range of motion;Taping    PT Next Visit Plan   repeated extension, H.S stretching, MT to lumbar, progress core and strength,  modalites PRN       Patient will benefit from skilled therapeutic intervention in order to improve the following deficits and impairments:  Decreased activity tolerance, Decreased range of motion, Hypomobility, Decreased strength, Increased fascial restricitons, Impaired flexibility  Visit Diagnosis: Chronic right-sided low back pain with right-sided sciatica     Problem List Patient Active Problem List   Diagnosis Date Noted  . Pain in joint, shoulder region 08/19/2014  . Left knee pain 04/30/2013  . Plantar fasciitis 03/06/2013  . HYPERLIPIDEMIA 04/20/2009  . ANXIETY 04/20/2009  . DIARRHEA 04/20/2009  . GASTROPARESIS 04/07/2009  . GERD 03/10/2009  . IRRITABLE BOWEL SYNDROME 03/10/2009    April Manson, PT, DPT 10/03/2017, 4:28 PM  [facility] [address] [place], [place], [address] Phone: [phone]   Fax:  [phone]  Name: Joseph Fuentes MRN: [id number] Date of Birth: September 01, 1955

## 2017-10-04 ENCOUNTER — Ambulatory Visit: Payer: BLUE CROSS/BLUE SHIELD | Admitting: Physical Therapy

## 2017-10-04 DIAGNOSIS — M5441 Lumbago with sciatica, right side: Principal | ICD-10-CM

## 2017-10-04 DIAGNOSIS — G8929 Other chronic pain: Secondary | ICD-10-CM | POA: Diagnosis not present

## 2017-10-04 NOTE — Therapy (Signed)
[place], [place], [address] Phone: [phone]   Fax:  [phone]  Physical Therapy Treatment  Patient Details  Name: Joseph Fuentes MRN: [id number] Date of Birth: 07/19/1955 Referring Provider: Renata Caprice DO   Encounter Date: 10/04/2017  PT End of Session - 10/04/17 1651    Visit Number  4    Number of Visits  12    Date for PT Re-Evaluation  10/30/17    PT Start Time  1224    PT Stop Time  1635   last 10 min on heat   PT Time Calculation (min)  50 min    Activity Tolerance  Patient tolerated treatment well;No increased pain    Behavior During Therapy  WFL for tasks assessed/performed       Past Medical History:  Diagnosis Date  . Celiac disease/sprue   . Concussion   . Deviated septum   . OSA (obstructive sleep apnea)   . Rheumatic fever     Past Surgical History:  Procedure Laterality Date  . arthroscopic knee surgery Left   . SEPTOPLASTY    . TENNIS ELBOW RELEASE/NIRSCHEL PROCEDURE    . VASECTOMY      There were no vitals filed for this visit.  Subjective Assessment - 10/04/17 1627    Subjective  Pt relays his back is doing better but he has been having tremors and feels it may be PD or may be from TENS.    Currently in Pain?  No/denies                       [facility] Adult PT Treatment/Exercise - 10/04/17 1628      Neuro Re-ed    Neuro Re-ed Details   core      Exercises   Exercises  Lumbar      Lumbar Exercises: Stretches   Passive Hamstring Stretch  Right;Left;2 reps;30 seconds    Lower Trunk Rotation  3 reps;10 seconds    Standing Extension  2 reps;10 reps    Piriformis Stretch  2 reps;30 seconds;Right;Left      Lumbar Exercises: Standing   Row  20 reps    Theraband Level (Row)  Level 3 (Green)    Shoulder Extension  20 reps    Theraband Level (Shoulder Extension)  Level 3 (Green)    Other Standing Lumbar Exercises  anti rotation/pilof press green x 15  bilat    Other Standing Lumbar Exercises  bowling simulation medium blue ball X 5 and larger orange ball 12 lbs X 5       Lumbar Exercises: Supine   Bent Knee Raise  --    Bridge  15 reps;2 seconds      Modalities   Modalities  Moist Heat      Moist Heat Therapy   Number Minutes Moist Heat  10 Minutes    Moist Heat Location  Lumbar Spine      Electrical Stimulation   Electrical Stimulation Location  --    Electrical Stimulation Goals  --      Manual Therapy   Manual therapy comments  --                  PT Long Term Goals - 10/04/17 1709      PT LONG TERM GOAL #1   Title  Pt will be I and compliant with HEP. 6 weeks 10/30/17    Status  Achieved  PT LONG TERM GOAL #2   Title  Pt will increase lumbar ROM to WNL to improve function. 6 weeks 10/30/17    Status  Partially Met      PT LONG TERM GOAL #3   Title  Pt will be able to improve sleep quality and have less back pain with sidelying. 6 weeks 10/30/17    Status  Partially Met      PT LONG TERM GOAL #4   Title  Pt will be able to perform all work and recreational activities including prolonged sitting and golf with less than 2/10 pain. 6 weeks 10/30/17    Status  Partially Met            Plan - 10/04/17 1653    Clinical Impression Statement  Pt is making great progress in all areas and he is having less pain. He wishes to return to bowling thus bowling simulated exercise performed today with weighted ball without compaints of pain. TENS held as he fears it could be contributing to tremors he has been having. He will see neurologist about this tomorrow.    Rehab Potential  Good    PT Duration  6 weeks    PT Treatment/Interventions  Cryotherapy;Electrical Stimulation;Iontophoresis 4mg/ml Dexamethasone;Moist Heat;Traction;Ultrasound;Therapeutic exercise;Therapeutic activities;Neuromuscular re-education;Manual techniques;Dry needling;Passive range of motion;Taping    PT Next Visit Plan   repeated extension,  H.S stretching, MT to lumbar, progress core and strength, modalites PRN       Patient will benefit from skilled therapeutic intervention in order to improve the following deficits and impairments:  Decreased activity tolerance, Decreased range of motion, Hypomobility, Decreased strength, Increased fascial restricitons, Impaired flexibility  Visit Diagnosis: Chronic right-sided low back pain with right-sided sciatica     Problem List Patient Active Problem List   Diagnosis Date Noted  . Pain in joint, shoulder region 08/19/2014  . Left knee pain 04/30/2013  . Plantar fasciitis 03/06/2013  . HYPERLIPIDEMIA 04/20/2009  . ANXIETY 04/20/2009  . DIARRHEA 04/20/2009  . GASTROPARESIS 04/07/2009  . GERD 03/10/2009  . IRRITABLE BOWEL SYNDROME 03/10/2009     R , PT, DPT 10/04/2017, 5:10 PM  [facility] [address] [place], [place], [address] Phone: [phone]   Fax:  [phone]  Name: Joseph Fuentes MRN: [id number] Date of Birth: 12/28/1955   

## 2017-10-05 ENCOUNTER — Ambulatory Visit (INDEPENDENT_AMBULATORY_CARE_PROVIDER_SITE_OTHER): Payer: BLUE CROSS/BLUE SHIELD | Admitting: Sports Medicine

## 2017-10-05 VITALS — BP 116/82 | Ht 72.0 in | Wt 230.0 lb

## 2017-10-05 DIAGNOSIS — G8929 Other chronic pain: Secondary | ICD-10-CM

## 2017-10-05 DIAGNOSIS — R253 Fasciculation: Secondary | ICD-10-CM | POA: Diagnosis not present

## 2017-10-05 DIAGNOSIS — E039 Hypothyroidism, unspecified: Secondary | ICD-10-CM | POA: Diagnosis not present

## 2017-10-05 DIAGNOSIS — M545 Low back pain: Secondary | ICD-10-CM

## 2017-10-05 DIAGNOSIS — G43909 Migraine, unspecified, not intractable, without status migrainosus: Secondary | ICD-10-CM | POA: Diagnosis not present

## 2017-10-05 DIAGNOSIS — Z23 Encounter for immunization: Secondary | ICD-10-CM | POA: Diagnosis not present

## 2017-10-05 NOTE — Progress Notes (Signed)
Patient ID: Joseph Fuentes, male   DOB: 09-19-1955, 61 y.o.   MRN: [id number]  Patient comes in today for follow-up after a diagnostic/therapeutic lumbar ESI. Low back pain and right leg pain has improved.He has developed some sort of tremor and is seeing his PCP later this afternoon. He initially thought it was from gabapentin so he discontinued this. He is asking about the possibility of the epidural steroid injection causing his tremor but I explained to him that this would be extremely unusual. He is doing well in physical therapy and plans to return to bowling next week.  He is also complaining of some returning right shoulder pain. He had an ultrasound-guided intra-articular cortisone injection 3 years ago which provided him with significant symptom relief.He is requesting repeat injection. I will schedule this to be done in our office in 2 weeks.  Total time spent with the patient was 10 minutes with greater than 50% of the time spent in face-to-face consultation discussing his response to diagnostic epidural steroid injection and planning on his upcoming right shoulder intra-articular cortisone injection.

## 2017-10-09 ENCOUNTER — Ambulatory Visit: Payer: BLUE CROSS/BLUE SHIELD | Admitting: Sports Medicine

## 2017-10-09 ENCOUNTER — Telehealth: Payer: Self-pay | Admitting: Neurology

## 2017-10-09 NOTE — Telephone Encounter (Signed)
Patient will need a new referral for this new problem. Called pt's wife back but the vm box was full.

## 2017-10-09 NOTE — Telephone Encounter (Signed)
Pt's wife scheduled appt on 10/23. Pt was seen by Dr Reade/PCP for tremors and twitches. Pt had labs and will bring that to [facility]  FYI

## 2017-10-10 ENCOUNTER — Ambulatory Visit: Payer: BLUE CROSS/BLUE SHIELD | Admitting: Physical Therapy

## 2017-10-10 DIAGNOSIS — M5441 Lumbago with sciatica, right side: Principal | ICD-10-CM

## 2017-10-10 DIAGNOSIS — G8929 Other chronic pain: Secondary | ICD-10-CM

## 2017-10-10 NOTE — Telephone Encounter (Signed)
Called pt/pt's wife & LVM asking for call back regarding appt. Left office number in message.

## 2017-10-11 ENCOUNTER — Encounter: Payer: Self-pay | Admitting: Physical Therapy

## 2017-10-11 NOTE — Therapy (Signed)
[facility] [address] [place], , [address] Phone: [phone]   Fax:  [phone]  Physical Therapy Treatment  Patient Details  Name: Joseph Fuentes MRN: [id number] Date of Birth: 11/19/1955 Referring Provider: T. Draper DO   Encounter Date: 10/10/2017  PT End of Session - 10/11/17 1539    Visit Number  5    Number of Visits  12    Date for PT Re-Evaluation  10/30/17    PT Start Time  1547    PT Stop Time  1625    PT Time Calculation (min)  38 min    Activity Tolerance  Patient tolerated treatment well;No increased pain       Past Medical History:  Diagnosis Date  . Celiac disease/sprue   . Concussion   . Deviated septum   . OSA (obstructive sleep apnea)   . Rheumatic fever     Past Surgical History:  Procedure Laterality Date  . arthroscopic knee surgery Left   . SEPTOPLASTY    . TENNIS ELBOW RELEASE/NIRSCHEL PROCEDURE    . VASECTOMY      There were no vitals filed for this visit.  Subjective Assessment - 10/10/17 1558    Subjective  Patient was able to play golf on Sunday and bowl on Monday. He was a little sore on Tuesday but it is better today. he is having no pain today.     Pertinent History  sleep apnea    Limitations  Sitting;Lifting;Standing;Writing    Diagnostic tests  MRI shows Mild bulging of the discs as outlined above. No large herniation. At L3-4, there is a slightly asymmetric disc bulge prominence in theright foraminal region that would have some potential to affect the right L3 nerve. Patient also has mild facet osteoarthritis at L4-5 which could lead to LBP    Patient Stated Goals  return to normal activities including housework and golf    Currently in Pain?  No/denies                       [facility] Adult PT Treatment/Exercise - 10/11/17 0001      Exercises   Exercises  Lumbar      Lumbar Exercises: Stretches   Passive Hamstring Stretch  Right;Left;2 reps;30 seconds     Lower Trunk Rotation  3 reps;10 seconds    Standing Extension  2 reps;10 reps    Piriformis Stretch  2 reps;30 seconds;Right;Left      Lumbar Exercises: Standing   Row  20 reps    Theraband Level (Row)  Level 3 (Green)    Shoulder Extension  20 reps    Theraband Level (Shoulder Extension)  Level 3 (Green)    Other Standing Lumbar Exercises  anti rotation/pilof press green 2x 15 bilat red band half swing 2x10;; yellow band follow through with cuing.    Other Standing Lumbar Exercises  bowling simulation medium blue ball X 5 and larger orange ball 12 lbs X 5       Lumbar Exercises: Supine   Bent Knee Raise  15 reps    Bridge  15 reps;2 seconds      Moist Heat Therapy   Moist Heat Location  Lumbar Spine             PT Education - 10/11/17 1539    Education Details  reviewed golf core strengthening exercises     Person(s) Educated  Patient    Methods  Explanation;Demonstration;Tactile cues;Verbal cues      Comprehension  Verbalized understanding;Returned demonstration;Verbal cues required;Tactile cues required          PT Long Term Goals - 10/04/17 1709      PT LONG TERM GOAL #1   Title  Pt will be I and compliant with HEP. 6 weeks 10/30/17    Status  Achieved      PT LONG TERM GOAL #2   Title  Pt will increase lumbar ROM to WNL to improve function. 6 weeks 10/30/17    Status  Partially Met      PT LONG TERM GOAL #3   Title  Pt will be able to improve sleep quality and have less back pain with sidelying. 6 weeks 10/30/17    Status  Partially Met      PT LONG TERM GOAL #4   Title  Pt will be able to perform all work and recreational activities including prolonged sitting and golf with less than 2/10 pain. 6 weeks 10/30/17    Status  Partially Met            Plan - 10/11/17 1541    Clinical Impression Statement  Patient tolerated treatemnt well. He had no increase in pain with golf specific activity. He appears to be making good progress.     Clinical  Presentation  Evolving    Clinical Decision Making  Moderate    Rehab Potential  Good    PT Frequency  2x / week    PT Duration  6 weeks    PT Treatment/Interventions  Cryotherapy;Electrical Stimulation;Iontophoresis 4mg/ml Dexamethasone;Moist Heat;Traction;Ultrasound;Therapeutic exercise;Therapeutic activities;Neuromuscular re-education;Manual techniques;Dry needling;Passive range of motion;Taping    PT Next Visit Plan   repeated extension, H.S stretching, MT to lumbar, progress core and strength, modalites PRN    Consulted and Agree with Plan of Care  Patient       Patient will benefit from skilled therapeutic intervention in order to improve the following deficits and impairments:  Decreased activity tolerance, Decreased range of motion, Hypomobility, Decreased strength, Increased fascial restricitons, Impaired flexibility  Visit Diagnosis: Chronic right-sided low back pain with right-sided sciatica     Problem List Patient Active Problem List   Diagnosis Date Noted  . Pain in joint, shoulder region 08/19/2014  . Left knee pain 04/30/2013  . Plantar fasciitis 03/06/2013  . HYPERLIPIDEMIA 04/20/2009  . ANXIETY 04/20/2009  . DIARRHEA 04/20/2009  . GASTROPARESIS 04/07/2009  . GERD 03/10/2009  . IRRITABLE BOWEL SYNDROME 03/10/2009     J  PT DPT  10/11/2017, 3:45 PM  [facility] [address] [place], [place], [address] Phone: [phone]   Fax:  [phone]  Name: Joseph Fuentes MRN: [id number] Date of Birth: 10/25/1955   

## 2017-10-17 ENCOUNTER — Ambulatory Visit: Payer: BLUE CROSS/BLUE SHIELD | Admitting: Physical Therapy

## 2017-10-17 DIAGNOSIS — M5441 Lumbago with sciatica, right side: Secondary | ICD-10-CM | POA: Diagnosis not present

## 2017-10-17 DIAGNOSIS — G8929 Other chronic pain: Secondary | ICD-10-CM

## 2017-10-17 NOTE — Therapy (Signed)
[place], [place], [address] Phone: [phone]   Fax:  [phone]  Physical Therapy Treatment/Discharge  Patient Details  Name: Joseph Fuentes MRN: [id number] Date of Birth: 1955-09-04 Referring Provider: Micheline Chapman, DO   Encounter Date: 10/17/2017  PT End of Session - 10/17/17 1621    Visit Number  6    Number of Visits  12    Date for PT Re-Evaluation  10/30/17    PT Start Time  6568    PT Stop Time  1633    PT Time Calculation (min)  48 min    Activity Tolerance  Patient tolerated treatment well    Behavior During Therapy  [facility] for tasks assessed/performed       Past Medical History:  Diagnosis Date  . Celiac disease/sprue   . Concussion   . Deviated septum   . OSA (obstructive sleep apnea)   . Rheumatic fever     Past Surgical History:  Procedure Laterality Date  . arthroscopic knee surgery Left   . SEPTOPLASTY    . TENNIS ELBOW RELEASE/NIRSCHEL PROCEDURE    . VASECTOMY      There were no vitals filed for this visit.  Subjective Assessment - 10/17/17 1600    Subjective  Pt relays his back is great but he is now having some knee pain. He feels ready for D/C for his back because he has no complaints with it.    Currently in Pain?  No/denies         [facility] PT Assessment - 10/17/17 0001      Assessment   Medical Diagnosis  LBP with Rt radic.    Referring Provider  Draper, DO      Observation/Other Assessments   Focus on Therapeutic Outcomes (FOTO)   improved to 28% limited      ROM / Strength   AROM / PROM / Strength  AROM      AROM   AROM Assessment Site  Lumbar    Lumbar Flexion  WFL    Lumbar Extension  [facility]    Lumbar - Right Side Bend  [facility]    Lumbar - Left Side Bend  WFL    Lumbar - Right Rotation  [facility]    Lumbar - Left Rotation  [facility]      Strength   Overall Strength Comments  LE strength WNL bilat                   [facility] Adult PT Treatment/Exercise - 10/17/17  0001      Lumbar Exercises: Stretches   Passive Hamstring Stretch  Right;Left;2 reps;30 seconds    Lower Trunk Rotation  3 reps;10 seconds    Standing Extension  2 reps;10 reps    Piriformis Stretch  2 reps;30 seconds;Right;Left      Lumbar Exercises: Supine   Bent Knee Raise  15 reps    Bridge  15 reps;2 seconds      Modalities   Modalities  Cryotherapy;Electrical Stimulation;Moist Heat      Moist Heat Therapy   Number Minutes Moist Heat  15 Minutes    Moist Heat Location  Lumbar Spine      Cryotherapy   Number Minutes Cryotherapy  15 Minutes    Cryotherapy Location  Knee    Type of Cryotherapy  Ice pack      Electrical Stimulation   Electrical Stimulation Location  lumbar and knee    Electrical Stimulation Action  TENS  Electrical Stimulation Parameters  tolerance    Electrical Stimulation Goals  Pain             PT Education - 10/17/17 1620    Education Details  final HEP review    Person(s) Educated  Patient    Methods  Explanation;Demonstration    Comprehension  Verbalized understanding          PT Long Term Goals - 10/17/17 1558      PT LONG TERM GOAL #1   Title  Pt will be I and compliant with HEP. 6 weeks 10/30/17    Status  Achieved      PT LONG TERM GOAL #2   Title  Pt will increase lumbar ROM to WNL to improve function. 6 weeks 10/30/17    Status  Achieved      PT LONG TERM GOAL #3   Title  Pt will be able to improve sleep quality and have less back pain with sidelying. 6 weeks 10/30/17    Status  Partially Met      PT LONG TERM GOAL #4   Title  Pt will be able to perform all work and recreational activities including prolonged sitting and golf with less than 2/10 pain. 6 weeks 10/30/17    Status  Achieved            Plan - 10/17/17 1621    Clinical Impression Statement  Pt has made great progress and now has no complaints with his back. He has met all PT goals except still has some discomfort with sleeping on occasion. He will be  D/C to HEP and he had no further questions or concerns regarding D/C and he relays he may return to PT for his knee pain once he sees MD about this.    Rehab Potential  Good    PT Frequency  2x / week    PT Duration  6 weeks    PT Treatment/Interventions  Cryotherapy;Electrical Stimulation;Iontophoresis '4mg'$ /ml Dexamethasone;Moist Heat;Traction;Ultrasound;Therapeutic exercise;Therapeutic activities;Neuromuscular re-education;Manual techniques;Dry needling;Passive range of motion;Taping    PT Next Visit Plan  D/C today    Consulted and Agree with Plan of Care  Patient       Patient will benefit from skilled therapeutic intervention in order to improve the following deficits and impairments:  Decreased activity tolerance, Decreased range of motion, Hypomobility, Decreased strength, Increased fascial restricitons, Impaired flexibility  Visit Diagnosis: Chronic right-sided low back pain with right-sided sciatica     Problem List Patient Active Problem List   Diagnosis Date Noted  . Pain in joint, shoulder region 08/19/2014  . Left knee pain 04/30/2013  . Plantar fasciitis 03/06/2013  . HYPERLIPIDEMIA 04/20/2009  . ANXIETY 04/20/2009  . DIARRHEA 04/20/2009  . GASTROPARESIS 04/07/2009  . GERD 03/10/2009  . IRRITABLE BOWEL SYNDROME 03/10/2009    Debbe Odea, PT, DPT 10/17/2017, 4:25 PM  [facility] [address] [place], [place], [address] Phone: [phone]   Fax:  [phone]  Name: Joseph Fuentes MRN: [id number] Date of Birth: [birth year]  PHYSICAL THERAPY DISCHARGE SUMMARY  Visits from Start of Care: 6  Current functional level related to goals / functional outcomes: FOTO improved to 28% limited   Remaining deficits: None for lumbar but now having Lt knee pain   Education / Equipment: Final HEP review Plan: Patient agrees to discharge.  Patient goals were met. Patient is being discharged due to meeting the stated  rehab goals.  ?????    Elsie Ra,  PT, DPT 10/17/17 4:27 PM

## 2017-10-23 ENCOUNTER — Ambulatory Visit (INDEPENDENT_AMBULATORY_CARE_PROVIDER_SITE_OTHER): Payer: BLUE CROSS/BLUE SHIELD | Admitting: Sports Medicine

## 2017-10-23 VITALS — BP 118/80 | Ht 72.0 in | Wt 230.0 lb

## 2017-10-23 DIAGNOSIS — M25511 Pain in right shoulder: Secondary | ICD-10-CM | POA: Diagnosis not present

## 2017-10-23 DIAGNOSIS — M25562 Pain in left knee: Secondary | ICD-10-CM | POA: Diagnosis not present

## 2017-10-23 MED ORDER — METHYLPREDNISOLONE ACETATE 40 MG/ML IJ SUSP
40.0000 mg | Freq: Once | INTRAMUSCULAR | Status: AC
  Administered 2017-10-23: 40 mg via INTRA_ARTICULAR

## 2017-10-24 ENCOUNTER — Encounter: Payer: Self-pay | Admitting: Sports Medicine

## 2017-10-24 NOTE — Progress Notes (Signed)
Subjective:    Patient ID: Joseph Fuentes, male    DOB: 08-07-1955, 62 y.o.   MRN: [id number]  HPI chief complaint: Right shoulder pain and left knee pain  Patient comes in today with a couple of different complaints.  First complaint is right shoulder pain this been present for a few weeks.  He denies any injury but has had problems with this shoulder in the past.  He was seen in our office in 2016 and an ultrasound-guided intra-articular cortisone injection provided him with complete symptom relief up until a few weeks ago.  His pain is beginning to return and, although it is not as bad as it was in 2016, he would like to consider a cortisone injection.  His pain is most noticeable when holding the arm in an abducted and externally rotated position.  He has not noticed any weakness.  He denies numbness or tingling.  He has not had any imaging of his shoulder. He is also complaining of left knee pain and swelling.  Symptoms began 2 weeks ago when bowling.  He has a history of left knee arthroscopy a few years ago.  Surgery was performed by Dr. Richardson Landry at the patient is unsure exactly what was done.  His symptoms are similar to what he experienced at that time.  He is complaining of medial sided knee pain with weightbearing and twisting.  He is also complaining of stiffness in the knee.  No locking.  He does describe some mild instability.    Review of Systems    As above Objective:   Physical Exam  Well-developed, well-nourished.  No acute distress.  Awake alert and oriented x3.  Vital signs reviewed  Right shoulder: Patient has limited active abduction secondary to pain.  He has full passive range of motion however, including passive external rotation.  No tenderness to palpation over the [facility] joint.  No tenderness to palpation over the bicipital groove.  Negative empty can, negative Hawkins.  Rotator cuff strength is 5/5 and does not reproduce pain.  Equivocal O'Brien's.  Equivocal clunk test.   Neurovascularly intact distally.  Left knee: Range of motion is 0 to 135 degrees.  Trace effusion.  He is tender to palpation along the medial joint line.  No tenderness along the lateral joint line.  Pain but no popping with McMurray's.  Positive Thessaly's.  Knee is stable to ligamentous exam.  Neurovascularly intact distally.  Walking with a slight limp.      Assessment & Plan:   Right shoulder pain likely secondary to degenerative labral tear Left knee pain and swelling likely secondary to DJD versus new degenerative meniscal tear  Both the right shoulder and left knee were injected today with cortisone.  Right shoulder was injected under ultrasound guidance.  Patient tolerated both procedures without difficulty.  We will give him a body helix compression sleeve to wear on his left knee when active.  I would also like to get some x-rays of his left knee to evaluate the degree of osteoarthritis present.  If shoulder pain persist despite today's injection that I would start with getting some x-rays to rule out glenohumeral OA.  If left knee pain persists, further work-up and treatment would depend on what his x-ray show.  Follow-up with me in 4 weeks for reevaluation.  Call with questions or concerns in the interim.  Consent obtained and verified. Time-out conducted. Noted no overlying erythema, induration, or other signs of local infection. Skin prepped in a  sterile fashion. Topical analgesic spray: Ethyl chloride. Joint: right shoulder, intra-articular (US guided) Needle: 25g 1.5 inch Completed without difficulty. Meds: 3cc 1% xylocaine, 1cc (40mg ) depomedrol  Advised to call if fevers/chills, erythema, induration, drainage, or persistent bleeding.  Consent obtained and verified. Time-out conducted. Noted no overlying erythema, induration, or other signs of local infection. Skin prepped in a sterile fashion. Topical analgesic spray: Ethyl chloride. Joint: left knee Needle: 25g 1.5  inch (anteriomedial approach) Completed without difficulty. Meds: 3cc 1% xylocaine, 1cc (40mg ) depomedrol  Advised to call if fevers/chills, erythema, induration, drainage, or persistent bleeding.

## 2017-11-21 ENCOUNTER — Encounter: Payer: Self-pay | Admitting: Neurology

## 2017-11-21 ENCOUNTER — Telehealth: Payer: Self-pay | Admitting: *Deleted

## 2017-11-21 ENCOUNTER — Ambulatory Visit: Payer: BLUE CROSS/BLUE SHIELD | Admitting: Neurology

## 2017-11-21 VITALS — BP 128/85 | HR 74 | Ht 72.0 in | Wt 235.0 lb

## 2017-11-21 DIAGNOSIS — G8929 Other chronic pain: Secondary | ICD-10-CM

## 2017-11-21 DIAGNOSIS — G43709 Chronic migraine without aura, not intractable, without status migrainosus: Secondary | ICD-10-CM

## 2017-11-21 DIAGNOSIS — R4189 Other symptoms and signs involving cognitive functions and awareness: Secondary | ICD-10-CM | POA: Diagnosis not present

## 2017-11-21 DIAGNOSIS — G9340 Encephalopathy, unspecified: Secondary | ICD-10-CM | POA: Diagnosis not present

## 2017-11-21 DIAGNOSIS — E538 Deficiency of other specified B group vitamins: Secondary | ICD-10-CM | POA: Diagnosis not present

## 2017-11-21 DIAGNOSIS — R51 Headache: Secondary | ICD-10-CM

## 2017-11-21 DIAGNOSIS — R519 Headache, unspecified: Secondary | ICD-10-CM

## 2017-11-21 DIAGNOSIS — R410 Disorientation, unspecified: Secondary | ICD-10-CM

## 2017-11-21 DIAGNOSIS — R413 Other amnesia: Secondary | ICD-10-CM | POA: Diagnosis not present

## 2017-11-21 MED ORDER — GALCANEZUMAB-GNLM 120 MG/ML ~~LOC~~ SOAJ: 240.0000 mg | Freq: Once | SUBCUTANEOUS | 0 refills | Status: AC

## 2017-11-21 MED ORDER — GALCANEZUMAB-GNLM 120 MG/ML ~~LOC~~ SOAJ: 120.0000 mg | SUBCUTANEOUS | 11 refills | Status: DC

## 2017-11-21 MED ORDER — ERENUMAB-AOOE 140 MG/ML ~~LOC~~ SOAJ: 140.0000 mg | SUBCUTANEOUS | 11 refills | Status: DC

## 2017-11-21 MED ORDER — ERENUMAB-AOOE 70 MG/ML ~~LOC~~ SOAJ: 70.0000 mg | Freq: Once | SUBCUTANEOUS | 0 refills | Status: DC

## 2017-11-21 NOTE — Patient Instructions (Addendum)
1. Sleep apnea - bring in the chip 2. Medication Topamax - start Emgality and start decreasing the topamax by 50mg  a month 3. MRI of the brain 4. They are planning to see an endocrinologist for his thyroid 5. Address Anemia can be making you tired 6. MRI of the brain w/wo contrast 7. Formal neurocognitive testing  8. FDG PET Scan for dementia if above is unremarkable  Memory Compensation Strategies  1. Use "WARM" strategy.  W= write it down  A= associate it  R= repeat it  M= make a mental note  2.   You can keep a Glass blower/designer.  Use a 3-ring notebook with sections for the following: calendar, important names and phone numbers,  medications, doctors' names/phone numbers, lists/reminders, and a section to journal what you did  each day.   3.    Use a calendar to write appointments down.  4.    Write yourself a schedule for the day.  This can be placed on the calendar or in a separate section of the Memory Notebook.  Keeping a  regular schedule can help memory.  5.    Use medication organizer with sections for each day or morning/evening pills.  You may need help loading it  6.    Keep a basket, or pegboard by the door.  Place items that you need to take out with you in the basket or on the pegboard.  You may also want to  include a message board for reminders.  7.    Use sticky notes.  Place sticky notes with reminders in a place where the task is performed.  For example: " turn off the  stove" placed by the stove, "lock the door" placed on the door at eye level, " take your medications" on  the bathroom mirror or by the place where you normally take your medications.  8.    Use alarms/timers.  Use while cooking to remind yourself to check on food or as a reminder to take your medicine, or as a  reminder to make a call, or as a reminder to perform another task, etc.

## 2017-11-21 NOTE — Progress Notes (Signed)
[facility]    Provider:  Dr Lucia Gaskins Referring Provider: [name], MD Primary Care Physician:  [name], MD  CC:  Memory loss   Interval history 11/21/2017: New referral from Dr. [name]. He has a hx of uncontrolled migraines, morbid obesity, OSA on cpap, hypothyroidism, insomnia, peripheral neuropathy.[name] He is on Topamax 200mg  daily. Also on xanax, ambien. Here with his wife who also provides information. The biggest thing wife has noticed is that  he will drive down the road and then forgets where he is going for 4-5 seconds, panics. He is having trouble remembering how to do his job that he has been doing for 30 years. He doesn't remember directions which is something he has always been very good at. He went to [place] and after he got there he didn't feel comfortable in a congested area. He sometimes doesn't know where he is even in familiar places in the car.  HE pays the bills and manages the finances. Dad is in memory care with severe dementia possibly alzheimers, started displaying in early 77s. Maternal grandmother alzheimers in early 19s. He is compliant but hasn't had it checked for years, he is very tired and never feels refreshed. He will bring in his chip so we can download only getting 5 hours sleep. No other focal neurologic deficits, associated symptoms, inciting events or modifiable factors.  Previous visit:  Jasyah Theurer is a 62 y.o. male here as a referral from Dr. [name] for migraines. PMHx migraines, HLD, IBS, hypothyroidism, insomnia, peripheral neuropathy. He has had migraines all his life. He started having migraines at the age of 5 or 6, a celiac diet helped when he was young. As he got older he didn't adhere to the diet. His baseline was 3-4 a month with exacerbations of 3 a week. After losing weight 30 pounds he didn't have many headaches during that timeframe. The last 6 months his migraines have been worse, no inciting events except when  his diet changed. Migraines start in the right cheek and radiates to the back of the right eye and temple and whole side of the right face. Relpax helps if he catches it. 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 Ice pack helps. No auras or flashing lights. No prodrome. They can occur in the mid morning or later in the evening. He has insomnia at least one night a week. He takes [place] every night and still some nights.he has headaches every day and most are migainous. He takes excedrin 6x a month, relpax 15x a month and ibuprofen 10x a month. No other focal neurologic deficits, associated symptoms, inciting events or modifiable factors.  Migraine medications tried: Topiramate, relpax  Reviewed notes, labs and imaging from outside physicians, which showed:  Reviewed primary care notes: He asked for 180 pills of Relpax, insurance only willing to dispense 36 tabs. Plan usually does not cover more than 12 tabs per month and will have to pay out of ocket for anything over that. Then patient said that he doesn't really need 180 Relpax every 90 days however after going for urine half without significant migraines his headaches have returned and now he's backed needing at least 72 tablets every 90 days. 72 tablets of Relpax were sent to the patient's pharmacy. He is on topiramate 150 mg at night, also Xanax, ibuprofen 800 mg 3 times a day, Excedrin extra strength when  necessary per med list. He is maintained on Topamax and back in July 2017 he was saying he was having 3-4 migraines per month. He uses Relpax with good response. He also has insomnia and currently taking Ambien. I reviewed exam which included normal general, head, eyes, ears, nose, throat, neck, chest, lungs, heart, extremities, skin.   CMP normal 03/24/2015 BUN 22 and creatinine 1.19, CBC 02/15/2015 normal  CT maxillofacial 04/23/2008 personally reviewed  report:  Comparison: None   Findings: Slight inflammatory enlargement right inferior nasal turbinates seen.  Minimal 5mm and 3 mm posterior nasal septal mucosal inflammatory polyps are seen.  9 mm mucous retention cyst is seen at the anteromedial inferior left maxillary antrum with paranasal sinuses otherwise clear.  Bilateral infundibuli appear widely patent.  No other significant abnormalities seen. 9 mm mucous retention cyst is seen at the anterior inferior left maxillary antrum (axial image 37).  Visualized paranasal sinuses are otherwise clear.  Middle ear cavities, mastoid air cells appear clear.   IMPRESSION:   1.  Minimal rhinitis findings and 9 mm left maxillary mucous retention cyst as described. 2.  Clear paranasal sinuses. 3.  No additional significant abnormality  Review of Systems: Patient complains of symptoms per HPI as well as the following symptoms: Feeling cold, allergies, runny nose, headache, insomnia. Pertinent negatives per HPI. All others negative.   Social History   Socioeconomic History  . Marital status: Married    Spouse name: Not on file  . Number of children: 2  . Years of education: 25  . Highest education level: Associate degree: academic program  Occupational History  . Occupation: Economist Needs  . Financial resource strain: Not on file  . Food insecurity:    Worry: Not on file    Inability: Not on file  . Transportation needs:    Medical: Not on file    Non-medical: Not on file  Tobacco Use  . Smoking status: Former Smoker    Types: Cigarettes    Last attempt to quit: 06/28/2006    Years since quitting: 11.4  . Smokeless tobacco: Never Used  Substance and Sexual Activity  . Alcohol use: Yes    Alcohol/week: 3.0 standard drinks    Types: 3 Cans of beer per week    Comment: social  . Drug use: Not Currently    Comment: none in decades   . Sexual activity: Not on file  Lifestyle  . Physical activity:    Days per week:  Not on file    Minutes per session: Not on file  . Stress: Not on file  Relationships  . Social connections:    Talks on phone: Not on file    Gets together: Not on file    Attends religious service: Not on file    Active member of club or organization: Not on file    Attends meetings of clubs or organizations: Not on file    Relationship status: Not on file  . Intimate partner violence:    Fear of current or ex partner: Not on file    Emotionally abused: Not on file    Physically abused: Not on file    Forced sexual activity: Not on file  Other Topics Concern  . Not on file  Social History Narrative   Lives at home with his wife   Left handed    Family History  Problem Relation Age of Onset  . Alzheimer's disease Father  severe dementia  . High Cholesterol Father   . Migraines Other        strong maternal fam hx   . High blood pressure Mother     Past Medical History:  Diagnosis Date  . Celiac disease/sprue   . Concussion   . Deviated septum   . OSA (obstructive sleep apnea)   . Rheumatic fever     Past Surgical History:  Procedure Laterality Date  . arthroscopic knee surgery Left   . SEPTOPLASTY    . TENNIS ELBOW RELEASE/NIRSCHEL PROCEDURE    . VASECTOMY      Current Outpatient Medications  Medication Sig Dispense Refill  . ALLEGRA-D ALLERGY & CONGESTION 180-240 MG 24 hr tablet Take 1 tablet by mouth daily as needed.  3  . ALPRAZolam (XANAX) 0.5 MG tablet 0.5-1 tablet by mouth as needed.    [name] aspirin-acetaminophen-caffeine (EXCEDRIN MIGRAINE) 250-250-65 MG tablet Take by mouth every 6 (six) hours as needed for headache.    . desoximetasone (TOPICORT) 0.25 % cream Apply 1 application topically daily as needed.    [name] DEXILANT 60 MG capsule Take 60 mg by mouth every other day.    . docusate sodium (COLACE) 100 MG capsule Take 100 mg by mouth daily.     [name] eletriptan (RELPAX) 40 MG tablet One tablet by mouth at onset of headache. May repeat in 2 hours if  headache persists or recurs.     . fluticasone (FLONASE) 50 MCG/ACT nasal spray Use one to two sprays in each nostril once daily. 48 g 2  . HYDROcodone-acetaminophen (NORCO) 10-325 MG tablet Take 1 tablet by mouth as needed.     [name] ibuprofen (ADVIL,MOTRIN) 600 MG tablet Take 600 mg by mouth as needed.    [name] ipratropium (ATROVENT) 0.06 % nasal spray Place 1 spray into the nose 2 (two) times daily. 45 mL 2  . ketoconazole (NIZORAL) 2 % cream Apply 1 application topically daily.    [name] levothyroxine (SYNTHROID) 300 MCG tablet Take 300 mcg by mouth daily.    . Multiple Vitamins-Minerals (MULTIVITAMIN PO) Take by mouth.    [name] NAFTIN 2 % CREA as needed.    [name] omeprazole (PRILOSEC) 40 MG capsule Take 1-2 tablets by mouth Daily.    [name] POTASSIUM PO Take by mouth.    . tamsulosin (FLOMAX) 0.4 MG CAPS capsule 1 capsule 30 minutes after the same meal each day    . topiramate (TOPAMAX) 200 MG tablet Take 1 tablet (200 mg total) by mouth daily. (Patient taking differently: Take 300 mg by mouth daily. ) 90 tablet 3  . triamcinolone ointment (KENALOG) 0.1 % as needed.    Lilian Kapur 625 MG tablet Take 3 tablets by mouth Twice daily.    [name] zolpidem (AMBIEN CR) 12.5 MG CR tablet     . Erenumab-aooe (AIMOVIG) 140 MG/ML SOAJ Inject 140 mg into the skin every 30 (thirty) days. 1 pen 11  . [START ON 12/22/2017] Galcanezumab-gnlm (EMGALITY) 120 MG/ML SOAJ Inject 120 mg into the skin every 30 (thirty) days. 1 pen 11  . naproxen sodium (ANAPROX) 550 MG tablet Take 550 mg by mouth as needed.     No current facility-administered medications for this visit.     Allergies as of 11/21/2017 - Review Complete 11/21/2017  Allergen Reaction Noted  . Penicillins      Vitals: BP 128/85 (BP Location: Right Arm, Patient Position: Sitting)   Pulse 74   Ht 6' (1.829 m)   Wt 235  lb (106.6 kg)   BMI 31.87 kg/m  Last Weight:  Wt Readings from Last 1 Encounters:  11/21/17 235 lb (106.6 kg)   Last Height:   Ht Readings from Last 1  Encounters:  11/21/17 6' (1.829 m)    Physical exam: Exam: Gen: NAD, conversant, well nourised, obese, well groomed                     CV: RRR, no MRG. No Carotid Bruits. No peripheral edema, warm, nontender Eyes: Conjunctivae clear without exudates or hemorrhage  Neuro: Detailed Neurologic Exam  Speech:    Speech is normal; fluent and spontaneous with normal comprehension.  Cognition:    The patient is oriented to person, place, and time;     recent and remote memory impaired;     language fluent;     Impaired attention, concentration, fund of knowledge Cranial Nerves:    The pupils are equal, round, and reactive to light. The fundi are normal and spontaneous venous pulsations are present. Visual fields are full to finger confrontation. Extraocular movements are intact. Trigeminal sensation is intact and the muscles of mastication are normal. The face is symmetric. The palate elevates in the midline. Hearing intact. Voice is normal. Shoulder shrug is normal. The tongue has normal motion without fasciculations.   Coordination:    Normal finger to nose and heel to shin. Normal rapid alternating movements.   Gait:    Heel-toe and tandem gait are normal.   Motor Observation:    No asymmetry, no atrophy, and no involuntary movements noted. Tone:    Normal muscle tone.    Posture:    Posture is normal. normal erect    Strength:    Strength is V/V in the upper and lower limbs.      Sensation: intact to LT     Reflex Exam:  DTR's:    Deep tendon reflexes in the upper and lower extremities are normal bilaterally.   Toes:    The toes are downgoing bilaterally.   Clonus:    Clonus is absent.      Assessment/Plan:  Very nice 61 year old here with his wife for evaluation of cognitive changes. Likely multifactorial including normal cognitive aging, medication (topamax, xanax,ambien), mood disorder, lifestyle (morbidly obese), sleep apnea, anemia, anxiety  1. Sleep apnea -  bring in the chip so we can download info on compliance and efficacy. Will request sleep apnea results from his sleep study. 2. Topamax - start Aimovig. In one monthh start decreasing the topamax by 50mg  a month 3. MRI of the brain w/wo contrast for reversible causes of dementia 4. They are planning to see an endocrinologist for his thyroid 5. Address Anemia with pcp, can be making you tired 6. Formal neurocognitive testing  7. FDG PET Scan for dementia if above unremarkable 8. Discussed good sleep hygiene 9. Address xanax and Remus Loffler use with pcp as these can cause cognitive changes  Orders Placed This Encounter  Procedures  . MR BRAIN W WO CONTRAST  . B12 and Folate Panel  . Methylmalonic acid, serum  . Homocysteine  . Basic Metabolic Panel  . Ambulatory referral to Neuropsychology   Cc: Dr. [name]  Meds ordered this encounter  Medications  . DISCONTD: Erenumab-aooe (AIMOVIG) 70 MG/ML SOAJ    Sig: Inject 70 mg into the skin once for 1 dose.    Dispense:  1 pen    Refill:  0    LOT: [id number] EXP: 01/21  .  DISCONTD: Erenumab-aooe (AIMOVIG) 140 MG/ML SOAJ    Sig: Inject 140 mg into the skin every 30 (thirty) days.    Dispense:  1 pen    Refill:  11  . Galcanezumab-gnlm (EMGALITY) 120 MG/ML SOAJ    Sig: Inject 240 mg into the skin once for 1 dose.    Dispense:  2 pen    Refill:  0    LOT: [id number] EXP: 05/2019  . DISCONTD: Galcanezumab-gnlm (EMGALITY) 120 MG/ML SOAJ    Sig: Inject 120 mg into the skin every 30 (thirty) days.    Dispense:  1 pen    Refill:  11  . Erenumab-aooe (AIMOVIG) 140 MG/ML SOAJ    Sig: Inject 140 mg into the skin every 30 (thirty) days.    Dispense:  1 pen    Refill:  11     Naomie Dean, MD  [facility] [address] [place], [place] [address]  Phone [phone] Fax [phone]

## 2017-11-21 NOTE — Telephone Encounter (Signed)
[facility]oke with [facility] and canceled Aimovig as pt will be trying Emgality instead d/t preference by [facility]. Pt was given loading dose of Emgality in office today and will be due for maintenance dose starting 12/22/17. Emgality 120 mg prescription sent to pharmacy.

## 2017-11-22 ENCOUNTER — Telehealth: Payer: Self-pay | Admitting: Neurology

## 2017-11-22 ENCOUNTER — Encounter: Payer: Self-pay | Admitting: Neurology

## 2017-11-22 DIAGNOSIS — G4733 Obstructive sleep apnea (adult) (pediatric): Secondary | ICD-10-CM | POA: Diagnosis not present

## 2017-11-22 MED ORDER — ERENUMAB-AOOE 140 MG/ML ~~LOC~~ SOAJ: 140.0000 mg | SUBCUTANEOUS | 11 refills | Status: DC

## 2017-11-22 NOTE — Telephone Encounter (Signed)
Joseph Fuentes/Joseph Fuentes, I asked patient to bring in the chip from his cpap so we could download the info. I am not sure when he will be stopping by. Toma Copier would you please call him tomorrow and ask when he will come to office with chip please? Net week would be good when Joseph Fuentes or kristin are in the office, Im not sure we know how to download. thanks

## 2017-11-23 NOTE — Telephone Encounter (Signed)
Called pt on his home # and LVM asking for call back as soon as he can. Left office number and hours in message.

## 2017-11-26 ENCOUNTER — Telehealth: Payer: Self-pay | Admitting: Neurology

## 2017-11-26 LAB — B12 AND FOLATE PANEL
FOLATE: 15.1 ng/mL (ref >3.0)
Vitamin B-12: 363 pg/mL (ref 232–1245)

## 2017-11-26 LAB — BASIC METABOLIC PANEL
BUN / CREAT RATIO: 11 (ref 10–24)
BUN: 12 mg/dL (ref 8–27)
CHLORIDE: 107 mmol/L — AB (ref 96–106)
CO2: 18 mmol/L — ABNORMAL LOW (ref 20–29)
Calcium: 9.6 mg/dL (ref 8.6–10.2)
Creatinine, Ser: 1.11 mg/dL (ref 0.76–1.27)
GFR calc Af Amer: 83 mL/min/{1.73_m2} (ref >59)
GFR calc non Af Amer: 72 mL/min/{1.73_m2} (ref >59)
GLUCOSE: 102 mg/dL — AB (ref 65–99)
Potassium: 3.9 mmol/L (ref 3.5–5.2)
SODIUM: 144 mmol/L (ref 134–144)

## 2017-11-26 LAB — HOMOCYSTEINE: HOMOCYSTEINE: 11.1 umol/L (ref 0.0–15.0)

## 2017-11-26 LAB — METHYLMALONIC ACID, SERUM: METHYLMALONIC ACID: 148 nmol/L (ref 0–378)

## 2017-11-26 NOTE — Telephone Encounter (Signed)
Called pt again on home # and LVM asking for call back as soon as he can. Left office number in message.

## 2017-11-26 NOTE — Telephone Encounter (Signed)
[employer] Auth: [id number] (exp. 11/26/17 to 12/25/17) order sent to [facility]. They will reach out to the pt to schedule.

## 2017-11-26 NOTE — Telephone Encounter (Signed)
Pt retuning RNs call stating his CPAP does not have a chip but [facility] should send a report over today

## 2017-11-26 NOTE — Telephone Encounter (Signed)
Called pt a 3rd time, this time on cell phone @ LVM asking for call back as soon as he can. Left office number in message.   When he calls back, please have him bring in his CPAP chip so we can download a report and check his usage.

## 2017-11-26 NOTE — Telephone Encounter (Signed)
Patient is wanting to know if you all have received the report yet?

## 2017-11-26 NOTE — Telephone Encounter (Signed)
Noted, thank you

## 2017-11-26 NOTE — Telephone Encounter (Signed)
Spoke to the patient he is aware of this and I gave him their number of [phone] to call them if he has not heard in the next 2-3 business days to contact them.

## 2017-11-26 NOTE — Telephone Encounter (Signed)
Report has not been received yet. Will see if pt can have [facility] fax it again.

## 2017-11-26 NOTE — Telephone Encounter (Signed)
Noted thanks °

## 2017-11-26 NOTE — Telephone Encounter (Signed)
Spoke to patient and he will call [employer] and have them fax report again to our office.

## 2017-11-27 ENCOUNTER — Ambulatory Visit: Payer: BLUE CROSS/BLUE SHIELD | Admitting: Sports Medicine

## 2017-11-28 ENCOUNTER — Encounter: Payer: Self-pay | Admitting: Neurology

## 2017-12-09 ENCOUNTER — Ambulatory Visit
Admission: RE | Admit: 2017-12-09 | Discharge: 2017-12-09 | Disposition: A | Discharge status: Home / Self Care | Payer: BLUE CROSS/BLUE SHIELD | Source: Ambulatory Visit | Attending: Neurology | Admitting: Neurology

## 2017-12-09 DIAGNOSIS — R51 Headache: Secondary | ICD-10-CM

## 2017-12-09 DIAGNOSIS — R519 Headache, unspecified: Secondary | ICD-10-CM

## 2017-12-09 DIAGNOSIS — G9340 Encephalopathy, unspecified: Secondary | ICD-10-CM | POA: Diagnosis not present

## 2017-12-09 DIAGNOSIS — G8929 Other chronic pain: Secondary | ICD-10-CM

## 2017-12-09 DIAGNOSIS — R410 Disorientation, unspecified: Secondary | ICD-10-CM

## 2017-12-09 MED ORDER — GADOBENATE DIMEGLUMINE 529 MG/ML IV SOLN
20.0000 mL | Freq: Once | INTRAVENOUS | Status: AC | PRN
  Administered 2017-12-09: 20 mL via INTRAVENOUS

## 2017-12-11 DIAGNOSIS — G43909 Migraine, unspecified, not intractable, without status migrainosus: Secondary | ICD-10-CM | POA: Diagnosis not present

## 2017-12-11 DIAGNOSIS — M545 Low back pain: Secondary | ICD-10-CM | POA: Diagnosis not present

## 2017-12-11 DIAGNOSIS — G4700 Insomnia, unspecified: Secondary | ICD-10-CM | POA: Diagnosis not present

## 2017-12-11 DIAGNOSIS — D509 Iron deficiency anemia, unspecified: Secondary | ICD-10-CM | POA: Diagnosis not present

## 2017-12-12 DIAGNOSIS — Z23 Encounter for immunization: Secondary | ICD-10-CM | POA: Diagnosis not present

## 2017-12-13 ENCOUNTER — Other Ambulatory Visit: Payer: Self-pay | Admitting: Family Medicine

## 2017-12-13 DIAGNOSIS — R0989 Other specified symptoms and signs involving the circulatory and respiratory systems: Secondary | ICD-10-CM

## 2017-12-19 ENCOUNTER — Other Ambulatory Visit: Payer: Self-pay | Admitting: Allergy and Immunology

## 2017-12-19 MED ORDER — FLUTICASONE PROPIONATE 50 MCG/ACT NA SUSP: NASAL | 0 refills | Status: DC

## 2017-12-19 MED ORDER — IPRATROPIUM BROMIDE 0.06 % NA SOLN: 1.0000 | Freq: Two times a day (BID) | NASAL | 0 refills | Status: DC

## 2017-12-19 NOTE — Telephone Encounter (Signed)
Sent in 90 day supply for patient. Informed patient he would need an office visit for additional refills.

## 2017-12-19 NOTE — Telephone Encounter (Signed)
Patient needs a refill on ipratropium, and fluticasone sent to [facility]

## 2017-12-20 ENCOUNTER — Telehealth: Payer: Self-pay | Admitting: Neurology

## 2017-12-20 NOTE — Telephone Encounter (Signed)
Pt wife(on DPR-Schechter,Cheryl)has called for an update as a result of the MRI results.  Wife is aware Dr Lucia GaskinsAhern and her RN are out of the office this week.  Pt will wait for a response from Dr Trevor MaceAhern's RN once she has spoken with Dr Lucia GaskinsAhern

## 2017-12-24 NOTE — Telephone Encounter (Signed)
Spoke with Elnita Maxwellheryl (on DPR). She is aware of MRI results. She wanted to know what the next steps are. RN reviewed that Dr. Lucia GaskinsAhern had ordered formal neurocognitive testing. Referral was written for Dr. Jimmye Normanodenborough. RN advised that she would check with referrals dept here as Elnita MaxwellCheryl reported pt has not been called yet for appointment. She also confirmed that pt is on Emgality and decreased his Topamax by 50 mg this past weekend. She was advised that if the testing is unremarkable, consider FDG pet scan (per Dr. Lucia GaskinsAhern). Advised her that RN would check with Dr. Lucia GaskinsAhern and if anything needs to change we will call back. Otherwise, plan on getting formal neurocognitive testing next. Cheryl verbalized appreciation. Encouraged call back for any further questions. She verbalized appreciation.

## 2017-12-24 NOTE — Telephone Encounter (Signed)
Called wife. Rang several times then line was silent. Unable to LVM. When she calls back, will remind her to have pt discuss Ambien and Xanax with PCP as this can cause cognitive problems. Otherwise, next step will be for pt to have the formal neurocognitive testing.

## 2017-12-24 NOTE — Telephone Encounter (Signed)
I have called patient about neurocognitive testing . Dr. Kieth Brightlyodenbough has declined referral he did not give me a reason. I Relayed to patient [facility] and he can be seen [facility] . Patient is going to talk to his wife and he will call me back.

## 2017-12-24 NOTE — Telephone Encounter (Signed)
Spoke with wife. Discussed that RN had spoken with Dr. Lucia GaskinsAhern. Plan is to get the formal neurocognitive testing. Also reminded pt's wife to have pt discuss Ambien & Xanax with PCP as this can cause cognitive problems. She verbalized understanding and will wait to hear back regarding the referral for the neurocognitive testing.

## 2017-12-26 DIAGNOSIS — G4733 Obstructive sleep apnea (adult) (pediatric): Secondary | ICD-10-CM | POA: Diagnosis not present

## 2018-01-02 NOTE — Telephone Encounter (Signed)
Called and left patient a message asking him to call me back about where he wanted to go.

## 2018-01-02 NOTE — Telephone Encounter (Signed)
Patient's wife called and she is going to do some research on  Neuro Psy referral . And she will call me back  With where she want's me to send referral.  Joseph Fuentes / Dr. Lucia GaskinsAhern . Patient has been having headaches daily please call.  Cheryl  [phone] .

## 2018-01-03 NOTE — Telephone Encounter (Signed)
That's fine. thanks

## 2018-01-03 NOTE — Telephone Encounter (Signed)
Spoke with Joseph Fuentes, pt's wife. She stated that [facility] and is taking Topiramate 150 mg daily at bedtime (had decreased by 50 mg previously). She stated that the headaches did get worse after decreasing the Topiramate. They are now daily and pt feels the need to take Excedrin or something otc daily. RN advised that it can take 5-6 months for the Emgality to take full effect. Advised on risk for rebound headaches with too much OTC med use. Also, per Dr. Lucia GaskinsAhern pt can go back up on the Topiramate to what he was on before. Joseph MaxwellCheryl verbalized understanding and she will have pt go back to taking 200 mg of Topiramate nightly. She was advised to call back if headaches do not improve with the increase in Topiramate. She verablized understanding & would like to revisit the decrease in Topiramate again in the future as this was the plan initially due to pt's memory problems. RN advised that she would send the message to Dr. Lucia GaskinsAhern and if there was anything to add or change we would call her back. She verbalized appreciation.

## 2018-01-08 ENCOUNTER — Ambulatory Visit
Admission: RE | Admit: 2018-01-08 | Discharge: 2018-01-08 | Disposition: A | Discharge status: Home / Self Care | Payer: BLUE CROSS/BLUE SHIELD | Source: Ambulatory Visit | Attending: Family Medicine | Admitting: Family Medicine

## 2018-01-08 DIAGNOSIS — R0989 Other specified symptoms and signs involving the circulatory and respiratory systems: Secondary | ICD-10-CM | POA: Diagnosis not present

## 2018-01-11 DIAGNOSIS — G4733 Obstructive sleep apnea (adult) (pediatric): Secondary | ICD-10-CM | POA: Diagnosis not present

## 2018-01-21 DIAGNOSIS — D509 Iron deficiency anemia, unspecified: Secondary | ICD-10-CM | POA: Diagnosis not present

## 2018-01-28 DIAGNOSIS — H903 Sensorineural hearing loss, bilateral: Secondary | ICD-10-CM | POA: Diagnosis not present

## 2018-02-05 ENCOUNTER — Ambulatory Visit (INDEPENDENT_AMBULATORY_CARE_PROVIDER_SITE_OTHER): Payer: BLUE CROSS/BLUE SHIELD | Admitting: Sports Medicine

## 2018-02-05 ENCOUNTER — Ambulatory Visit
Admission: RE | Admit: 2018-02-05 | Discharge: 2018-02-05 | Disposition: A | Discharge status: Home / Self Care | Payer: BLUE CROSS/BLUE SHIELD | Source: Ambulatory Visit | Attending: Sports Medicine | Admitting: Sports Medicine

## 2018-02-05 VITALS — BP 110/70 | Ht 72.0 in | Wt 230.0 lb

## 2018-02-05 DIAGNOSIS — M25562 Pain in left knee: Secondary | ICD-10-CM | POA: Diagnosis not present

## 2018-02-05 DIAGNOSIS — M1712 Unilateral primary osteoarthritis, left knee: Secondary | ICD-10-CM | POA: Diagnosis not present

## 2018-02-05 DIAGNOSIS — M545 Low back pain, unspecified: Secondary | ICD-10-CM

## 2018-02-05 DIAGNOSIS — G8929 Other chronic pain: Secondary | ICD-10-CM | POA: Diagnosis not present

## 2018-02-05 MED ORDER — METHYLPREDNISOLONE ACETATE 40 MG/ML IJ SUSP
40.0000 mg | Freq: Once | INTRAMUSCULAR | Status: AC
  Administered 2018-02-05: 40 mg via INTRA_ARTICULAR

## 2018-02-06 ENCOUNTER — Encounter: Payer: Self-pay | Admitting: Sports Medicine

## 2018-02-06 ENCOUNTER — Other Ambulatory Visit: Payer: Self-pay | Admitting: Sports Medicine

## 2018-02-06 DIAGNOSIS — M545 Low back pain, unspecified: Secondary | ICD-10-CM

## 2018-02-06 NOTE — Progress Notes (Signed)
   Subjective:    Patient ID: Joseph Fuentes, male    DOB: December 12, 1955, 63 y.o.   MRN: [id number]  HPI chief complaint: Low back pain and left knee pain  Trace comes in with a couple of different complaints.  First complaint is returning low back pain.  An MRI scan done of his lumbar spine in August 2019 showed a slight rightward disc bulge at L3-L4.  An epidural steroid injection provided him with significant relief up until recently.  Pain has started to return without any injury.  It is localized to the low back.  He is not experiencing any returning radiation into his right leg.  No numbness or tingling.  His most noticeable with prolonged sitting.  Also has pain with bowling. He is also complaining of returning left knee pain.  Cortisone injection administered in September was helpful briefly but his pain is returned.  It is all along the medial joint line.  No swelling.  He does endorse some cracking and popping in the knee.  Pain with squatting.  No recent imaging.    Review of Systems As above    Objective:   Physical Exam  Well-developed, well-nourished.  No acute distress.  Awake alert and oriented x3.  Vital signs reviewed  Lumbar spine: Fairly good lumbar range of motion.  Some pain with forward flexion.  No tenderness along the lumbar midline. Neurological exam: No gross neurological deficit of either lower extremity  Left knee: Full range of motion.  No effusion.  He is tender to palpation along the medial joint line.  Negative McMurray's but a positive Thessaly's.  No tenderness along the lateral joint line.  Knee is stable to ligamentous exam.  Neurovascularly intact distally.  X-rays of the left knee including AP, lateral, and sunrise views show mild to moderate degenerative changes primarily at the patellofemoral joint.  Nothing acute.      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  An anterior medial approach is utilized.  Patient tolerates this without difficulty.  If pain persist despite today's repeat injection then consider merits of further diagnostic imaging. In regards to his returning low back pain, I have recommended repeat epidural steroid injection.  I have also explained to him the importance of resuming his home exercises that he learned in formal physical therapy last year.  Follow-up for ongoing or recalcitrant issues.  Consent obtained and verified. Time-out conducted. Noted no overlying erythema, induration, or other signs of local infection. Skin prepped in a sterile fashion. Topical analgesic spray: Ethyl chloride. Joint: left knee Needle: 25g 1.5 inch Completed without difficulty. Meds: 3cc 1% xylocaine, 1cc(40mg ) depomedrol  Advised to call if fevers/chills, erythema, induration, drainage, or persistent bleeding.

## 2018-02-07 DIAGNOSIS — G4733 Obstructive sleep apnea (adult) (pediatric): Secondary | ICD-10-CM | POA: Diagnosis not present

## 2018-02-15 ENCOUNTER — Other Ambulatory Visit: Payer: Self-pay | Admitting: Sports Medicine

## 2018-02-15 ENCOUNTER — Ambulatory Visit
Admission: RE | Admit: 2018-02-15 | Discharge: 2018-02-15 | Disposition: A | Discharge status: Home / Self Care | Payer: BLUE CROSS/BLUE SHIELD | Source: Ambulatory Visit | Attending: Sports Medicine | Admitting: Sports Medicine

## 2018-02-15 DIAGNOSIS — M545 Low back pain, unspecified: Secondary | ICD-10-CM

## 2018-02-15 DIAGNOSIS — M47817 Spondylosis without myelopathy or radiculopathy, lumbosacral region: Secondary | ICD-10-CM | POA: Diagnosis not present

## 2018-02-15 MED ORDER — DIAZEPAM 5 MG PO TABS
10.0000 mg | ORAL_TABLET | Freq: Once | ORAL | Status: AC
  Administered 2018-02-15: 5 mg via ORAL

## 2018-02-15 MED ORDER — METHYLPREDNISOLONE ACETATE 40 MG/ML INJ SUSP (RADIOLOG
120.0000 mg | Freq: Once | INTRAMUSCULAR | Status: AC
  Administered 2018-02-15: 120 mg via EPIDURAL

## 2018-02-15 MED ORDER — IOPAMIDOL (ISOVUE-M 200) INJECTION 41%
1.0000 mL | Freq: Once | INTRAMUSCULAR | Status: AC
  Administered 2018-02-15: 1 mL via EPIDURAL

## 2018-02-15 NOTE — Discharge Instructions (Signed)

## 2018-02-19 ENCOUNTER — Encounter: Payer: Self-pay | Admitting: Allergy and Immunology

## 2018-02-19 ENCOUNTER — Ambulatory Visit (INDEPENDENT_AMBULATORY_CARE_PROVIDER_SITE_OTHER): Payer: BLUE CROSS/BLUE SHIELD | Admitting: Allergy and Immunology

## 2018-02-19 VITALS — BP 112/80 | HR 76 | Resp 16 | Ht 72.0 in | Wt 237.0 lb

## 2018-02-19 DIAGNOSIS — J3089 Other allergic rhinitis: Secondary | ICD-10-CM | POA: Diagnosis not present

## 2018-02-19 MED ORDER — FLUTICASONE PROPIONATE 50 MCG/ACT NA SUSP: NASAL | 3 refills | Status: DC

## 2018-02-19 MED ORDER — IPRATROPIUM BROMIDE 0.06 % NA SOLN: 1.0000 | Freq: Two times a day (BID) | NASAL | 3 refills | Status: DC

## 2018-02-19 NOTE — Progress Notes (Signed)
Follow-up Note  Referring Provider: [name], MD Primary Provider: [name], MD Date of Office Visit: 02/19/2018  Subjective:   Joseph Fuentes (DOB: 1955/08/09) is a 63 y.o. male who [facility] in re-evaluation of the following:  HPI: Joseph Fuentes returns to this clinic in evaluation of his allergic rhinoconjunctivitis.  I have not seen him in his clinic since 14 November 2016.  As long as he consistently uses Flonase and nasal ipratropium he has done very well regarding all of his upper airway issues and it does not sound as though he has required a systemic steroid or an antibiotic to treat any type of airway issue since his last visit.  He also has an issue with reflux that presents itself as cough and as long as he continues on a proton pump inhibitor twice a day he does very well with minimal lower airway symptoms.  He did obtain the flu vaccine this year.  Allergies as of 02/19/2018      Reactions   Penicillins       Medication List      ALLEGRA-D ALLERGY & CONGESTION 180-240 MG 24 hr tablet Generic drug:  fexofenadine-pseudoephedrine Take 1 tablet by mouth daily as needed.   ALPRAZolam 0.5 MG tablet Commonly known as:  XANAX 0.5-1 tablet by mouth as needed.   aspirin-acetaminophen-caffeine 250-250-65 MG tablet Commonly known as:  EXCEDRIN MIGRAINE Take by mouth every 6 (six) hours as needed for headache.   desoximetasone 0.25 % cream Commonly known as:  TOPICORT Apply 1 application topically daily as needed.   DEXILANT 60 MG capsule Generic drug:  dexlansoprazole Take 60 mg by mouth every other day.   docusate sodium 100 MG capsule Commonly known as:  COLACE Take 100 mg by mouth daily.   eletriptan 40 MG tablet Commonly known as:  RELPAX One tablet by mouth at onset of headache. May repeat in 2 hours if headache persists or recurs.   Erenumab-aooe 140 MG/ML Soaj Commonly known as:  AIMOVIG Inject 140 mg  into the skin every 30 (thirty) days.   FLOMAX 0.4 MG Caps capsule Generic drug:  tamsulosin 1 capsule 30 minutes after the same meal each day   fluticasone 50 MCG/ACT nasal spray Commonly known as:  FLONASE Use one to two sprays in each nostril once daily.   Galcanezumab-gnlm 120 MG/ML Soaj Commonly known as:  EMGALITY Inject 120 mg into the skin every 30 (thirty) days.   HYDROcodone-acetaminophen 10-325 MG tablet Commonly known as:  NORCO Take 1 tablet by mouth as needed.   ibuprofen 600 MG tablet Commonly known as:  ADVIL,MOTRIN Take 600 mg by mouth as needed.   ipratropium 0.06 % nasal spray Commonly known as:  ATROVENT Place 1 spray into the nose 2 (two) times daily.   ketoconazole 2 % cream Commonly known as:  NIZORAL Apply 1 application topically daily.   MULTIVITAMIN PO Take by mouth.   NAFTIN 2 % Crea Generic drug:  Naftifine HCl as needed.   naproxen sodium 550 MG tablet Commonly known as:  ANAPROX Take 550 mg by mouth as needed.   omeprazole 40 MG capsule Commonly known as:  PRILOSEC Take 1-2 tablets by mouth Daily.   POTASSIUM PO Take by mouth.   SYNTHROID 300 MCG tablet Generic drug:  levothyroxine Take 300 mcg by mouth daily.   topiramate 200 MG tablet Commonly known as:  TOPAMAX Take 1 tablet (200 mg total) by mouth daily.  triamcinolone ointment 0.1 % Commonly known as:  KENALOG as needed.   WELCHOL 625 MG tablet Generic drug:  colesevelam Take 3 tablets by mouth Twice daily.   zolpidem 12.5 MG CR tablet Commonly known as:  AMBIEN CR       Past Medical History:  Diagnosis Date  . Celiac disease/sprue   . Concussion   . Deviated septum   . OSA (obstructive sleep apnea)   . Rheumatic fever     Past Surgical History:  Procedure Laterality Date  . arthroscopic knee surgery Left   . SEPTOPLASTY    . TENNIS ELBOW RELEASE/NIRSCHEL PROCEDURE    . VASECTOMY      Review of systems negative except as noted in HPI / PMHx or  noted below:  Review of Systems  Constitutional: Negative.   HENT: Negative.   Eyes: Negative.   Respiratory: Negative.   Cardiovascular: Negative.   Gastrointestinal: Negative.   Genitourinary: Negative.   Musculoskeletal: Negative.   Skin: Negative.   Neurological: Negative.   Endo/Heme/Allergies: Negative.   Psychiatric/Behavioral: Negative.      Objective:   Vitals:   02/19/18 1651  BP: 112/80  Pulse: 76  Resp: 16   Height: 6' (182.9 cm)  Weight: 237 lb (107.5 kg)   Physical Exam Constitutional:      Appearance: He is not diaphoretic.  HENT:     Head: Normocephalic.     Right Ear: Tympanic membrane, ear canal and external ear normal.     Left Ear: Tympanic membrane, ear canal and external ear normal.     Nose: Nose normal. No mucosal edema or rhinorrhea.     Mouth/Throat:     Pharynx: Uvula midline. No oropharyngeal exudate.  Eyes:     Conjunctiva/sclera: Conjunctivae normal.  Neck:     Thyroid: No thyromegaly.     Trachea: Trachea normal. No tracheal tenderness or tracheal deviation.  Cardiovascular:     Rate and Rhythm: Normal rate and regular rhythm.     Heart sounds: Normal heart sounds, S1 normal and S2 normal. No murmur.  Pulmonary:     Effort: No respiratory distress.     Breath sounds: Normal breath sounds. No stridor. No wheezing or rales.  Lymphadenopathy:     Head:     Right side of head: No tonsillar adenopathy.     Left side of head: No tonsillar adenopathy.     Cervical: No cervical adenopathy.  Skin:    Findings: No erythema or rash.     Nails: There is no clubbing.   Neurological:     Mental Status: He is alert.     Diagnostics: none  Assessment and Plan:   1. Other allergic rhinitis     1. Continue Flonase 1-2 sprays each nostril one time per day  2. Continue nasal ipratropium 0.06% 2 sprays each nostril up to 4 times per day if needed  3. Return to clinic in 1 year or earlier if problem  Joseph Fuentes is done very well and I  have refilled his medications and he can follow-up in his clinic as needed.  If he can have his primary care doctor refill his medications then there is probably no need for him to be seen in this clinic on a regular basis.  Certainly if he develops significant problems in the future I will be very happy to see him back.  Laurette Schimke, MD Allergy / Immunology [facility]

## 2018-02-19 NOTE — Patient Instructions (Signed)
  1. Continue Flonase 1-2 sprays each nostril one time per day  2. Continue nasal ipratropium 0.06% 2 sprays each nostril up to 4 times per day if needed  3. Return to clinic in 1 year or earlier if problem

## 2018-02-20 ENCOUNTER — Encounter: Payer: Self-pay | Admitting: Allergy and Immunology

## 2018-02-26 ENCOUNTER — Other Ambulatory Visit: Payer: Self-pay | Admitting: *Deleted

## 2018-02-26 DIAGNOSIS — M25562 Pain in left knee: Secondary | ICD-10-CM

## 2018-03-01 DIAGNOSIS — Z23 Encounter for immunization: Secondary | ICD-10-CM | POA: Diagnosis not present

## 2018-03-03 ENCOUNTER — Ambulatory Visit
Admission: RE | Admit: 2018-03-03 | Discharge: 2018-03-03 | Disposition: A | Discharge status: Home / Self Care | Payer: BLUE CROSS/BLUE SHIELD | Source: Ambulatory Visit | Attending: Sports Medicine | Admitting: Sports Medicine

## 2018-03-03 DIAGNOSIS — M25562 Pain in left knee: Secondary | ICD-10-CM

## 2018-03-03 DIAGNOSIS — M25462 Effusion, left knee: Secondary | ICD-10-CM | POA: Diagnosis not present

## 2018-03-06 ENCOUNTER — Telehealth: Payer: Self-pay | Admitting: Sports Medicine

## 2018-03-06 NOTE — Telephone Encounter (Signed)
  I spoke with the patient yesterday on the phone after reviewing MRI findings of his left knee.  There is no significant meniscal tear.  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  Based on these findings I recommended consultation with either Dr. Eulah Pont or Dr. Fara Chute to discuss the possibility of Visco supplementation prior to operative intervention.

## 2018-03-07 NOTE — Telephone Encounter (Signed)
Dr Darol Destine & [facility] [address] [place] [address] [phone] Wednesday Feb. 12th at 345pm  Patient is being referred to begin visco injection series

## 2018-03-13 DIAGNOSIS — M25562 Pain in left knee: Secondary | ICD-10-CM | POA: Diagnosis not present

## 2018-03-15 DIAGNOSIS — G4733 Obstructive sleep apnea (adult) (pediatric): Secondary | ICD-10-CM | POA: Diagnosis not present

## 2018-03-28 DIAGNOSIS — J069 Acute upper respiratory infection, unspecified: Secondary | ICD-10-CM | POA: Diagnosis not present

## 2018-04-01 DIAGNOSIS — J9801 Acute bronchospasm: Secondary | ICD-10-CM | POA: Diagnosis not present

## 2018-04-03 ENCOUNTER — Other Ambulatory Visit: Payer: Self-pay | Admitting: Family Medicine

## 2018-04-03 ENCOUNTER — Ambulatory Visit
Admission: RE | Admit: 2018-04-03 | Discharge: 2018-04-03 | Disposition: A | Discharge status: Home / Self Care | Payer: BLUE CROSS/BLUE SHIELD | Source: Ambulatory Visit | Attending: Family Medicine | Admitting: Family Medicine

## 2018-04-03 DIAGNOSIS — R05 Cough: Secondary | ICD-10-CM

## 2018-04-03 DIAGNOSIS — J9801 Acute bronchospasm: Secondary | ICD-10-CM

## 2018-04-03 DIAGNOSIS — R059 Cough, unspecified: Secondary | ICD-10-CM

## 2018-04-16 DIAGNOSIS — G4733 Obstructive sleep apnea (adult) (pediatric): Secondary | ICD-10-CM | POA: Diagnosis not present

## 2018-04-18 ENCOUNTER — Telehealth: Payer: Self-pay | Admitting: Neurology

## 2018-04-18 NOTE — Telephone Encounter (Signed)
Pt states he has not had very good results with the Galcanezumab-gnlm (EMGALITY) 120 MG/ML SOAJ he states he Is actually getting more. Also he states that he believed he was to be weaned off of the topiramate (TOPAMAX) 200 MG tablet but did not know when he was to start. Please advise.

## 2018-04-19 NOTE — Telephone Encounter (Signed)
That's wonderful! I would cut back his Topiramate to 1/2 his current dose and see how it goes. If he is still doing well in a month we will cut it back by 50% again. Thanks!

## 2018-04-19 NOTE — Telephone Encounter (Signed)
Called the patient and he states that his headaches have actually worsened on the emgality. He has been on this since November. Per Dr Lucia Gaskins she would recommend the patient continue current regimen for now. She offered the patient come in for an apt but with current status of virus she would prefer not to if not needed. Dr Lucia Gaskins advised the patient send a message through my chart. This will allow for her to review the chart and give her time to see what options can be provided for the patient. Pt verbalized understanding and was appreciative.

## 2018-04-22 ENCOUNTER — Telehealth: Payer: Self-pay

## 2018-04-22 NOTE — Telephone Encounter (Signed)
See mychart.  

## 2018-04-22 NOTE — Telephone Encounter (Signed)
Spoke with the patient and he gave verbal consent to doing a telephone visit with Amy  See most recent mychart message for concerns.

## 2018-04-23 ENCOUNTER — Encounter: Payer: Self-pay | Admitting: Family Medicine

## 2018-04-23 ENCOUNTER — Other Ambulatory Visit: Payer: Self-pay

## 2018-04-23 ENCOUNTER — Ambulatory Visit (INDEPENDENT_AMBULATORY_CARE_PROVIDER_SITE_OTHER): Payer: BLUE CROSS/BLUE SHIELD | Admitting: Family Medicine

## 2018-04-23 DIAGNOSIS — G43709 Chronic migraine without aura, not intractable, without status migrainosus: Secondary | ICD-10-CM | POA: Diagnosis not present

## 2018-04-23 MED ORDER — TOPIRAMATE 200 MG PO TABS: 200.0000 mg | ORAL_TABLET | Freq: Every day | ORAL | 3 refills | Status: DC

## 2018-04-23 MED ORDER — ERENUMAB-AOOE 140 MG/ML ~~LOC~~ SOAJ: 140.0000 mg | SUBCUTANEOUS | 11 refills | Status: DC

## 2018-04-23 NOTE — Progress Notes (Signed)
Made any corrections needed, and agree with history, physical, neuro exam,assessment and plan as stated.     Amneet Cendejas, MD [facility]  

## 2018-04-23 NOTE — Progress Notes (Signed)
PATIENT: Joseph Fuentes DOB: Aug 16, 1955  REASON FOR VISIT: follow up HISTORY FROM: patient  Virtual Visit via Telephone Note  I connected with Joseph Fuentes on 04/23/18 at  3:30 PM EDT by telephone and verified that I am speaking with the correct person using two identifiers.   I discussed the limitations, risks, security and privacy concerns of performing an evaluation and management service by telephone and the availability of in person appointments. I also discussed with the patient that there may be a patient responsible charge related to this service. The patient expressed understanding and agreed to proceed.   History of Present Illness: 04/23/18 Joseph Fuentes is a 63 y.o. male for follow up for migraines.  Joseph Fuentes reports that his migraines have increased in frequency he over the last 6 months.  He was started on Emgality in November 2018.  He has not had benefit with this medication. He reports having migraines about 3-4 times a week. They are unchanged from last visit but occur more frequently. He had decreased topiramate in 07/2017 due to concerns of memory loss. He reports that he had to go back to the 200mg  dose as his migraines were much worse. He is taking ibuprofen about three times a week for back pain. He also uses Excedrin twice weekly. He reports that this therapy has not changed recently and denies concerns of rebound headaches. He denies any new or concerning symptoms such as vision changes, speech changes, confusion or weakness.    HPI (copied from Dr Trevor Mace note) Interval history 11/21/2017: New referral from Dr. [name]. He has a hx of uncontrolled migraines, morbid obesity, OSA on cpap, hypothyroidism, insomnia, peripheral neuropathy.[name] He is on Topamax 200mg  daily. Also on xanax, ambien. Here with his wife who also provides information. The biggest thing wife has noticed is that  he will drive down the road and then forgets where he is going for 4-5 seconds, panics.  He is having trouble remembering how to do his job that he has been doing for 30 years. He doesn't remember directions which is something he has always been very good at. He went to [place] and after he got there he didn't feel comfortable in a congested area. He sometimes doesn't know where he is even in familiar places in the car.  HE pays the bills and manages the finances. Dad is in memory care with severe dementia possibly alzheimers, started displaying in early 33s. Maternal grandmother alzheimers in early 17s. He is compliant but hasn't had it checked for years, he is very tired and never feels refreshed. He will bring in his chip so we can download only getting 5 hours sleep. No other focal neurologic deficits, associated symptoms, inciting events or modifiable factors.  Previous visit:  Joseph Fuentes is a 63 y.o. male here as a referral from Dr. [name] for migraines. PMHx migraines, HLD, IBS, hypothyroidism, insomnia, peripheral neuropathy. He has had migraines all his life. He started having migraines at the age of 5 or 6, a celiac diet helped when he was young. As he got older he didn't adhere to the diet. His baseline was 3-4 a month with exacerbations of 3 a week. After losing weight 30 pounds he didn't have many headaches during that timeframe. The last 6 months his migraines have been worse, no inciting events except when his diet changed. Migraines start in the right cheek and radiates to the back of the right eye and temple and whole side of the  right face. Relpax helps if he catches it. 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 Ice pack helps. No auras or flashing lights. No prodrome. They can occur in the mid morning or later in the evening. He has insomnia at least one night a week. He takes [place] every night and still some nights.he has headaches every day and most are migainous. He takes excedrin 6x  a month, relpax 15x a month and ibuprofen 10x a month. No other focal neurologic deficits, associated symptoms, inciting events or modifiable factors.  Migraine medications tried: Topiramate, relpax  Reviewed notes, labs and imaging from outside physicians, which showed:  Reviewed primary care notes: He asked for 180 pills of Relpax, insurance only willing to dispense 36 tabs. Plan usually does not cover more than 12 tabs per month and will have to pay out of ocket for anything over that. Then patient said that he doesn't really need 180 Relpax every 90 days however after going for urine half without significant migraines his headaches have returned and now he's backed needing at least 72 tablets every 90 days. 72 tablets of Relpax were sent to the patient's pharmacy. He is on topiramate 150 mg at night, also Xanax, ibuprofen 800 mg 3 times a day, Excedrin extra strength when necessary per med list. He is maintained on Topamax and back in July 2017 he was saying he was having 3-4 migraines per month. He uses Relpax with good response. He also has insomnia and currently taking Ambien. I reviewed exam which included normal general, head, eyes, ears, nose, throat, neck, chest, lungs, heart, extremities, skin.   CMP normal 03/24/2015 BUN 22 and creatinine 1.19, CBC 02/15/2015 normal  CT maxillofacial 04/23/2008 personally reviewed report:  Comparison: None  Findings: Slight inflammatory enlargement right inferior nasal turbinates seen. Minimal 78mm and 3 mm posterior nasal septal mucosal inflammatory polyps are seen. 9 mm mucous retention cyst is seen at the anteromedial inferior left maxillary antrum with paranasal sinuses otherwise clear. Bilateral infundibuli appear widely patent. No other significant abnormalities seen. 9 mm mucous retention cyst is seen at the anterior inferior left maxillary antrum (axial image 37). Visualized paranasal sinuses are otherwise clear. Middle ear  cavities, mastoid air cells appear clear.  IMPRESSION:  1. Minimal rhinitis findings and 9 mm left maxillary mucous retention cyst as described. 2. Clear paranasal sinuses. 3. No additional significant abnormality   Observations/Objective:  Generalized: Well developed, in no acute distress  Mentation: Alert oriented to time, place, history taking. Follows all commands speech and language fluent   Assessment and Plan:  63 y.o. year old male  has a past medical history of Celiac disease/sprue, Concussion, Deviated septum, OSA (obstructive sleep apnea), and Rheumatic fever. here with    ICD-10-CM   1. Chronic migraine without aura without status migrainosus, not intractable G43.709    We have discussed several different options for treatment including add propranolol, amitriptyline, Botox therapy or changing Emgality to Amovig. After reviewing benefits and side effects of each medication, Joseph Fuentes wishes to stop Emgality and try Amovig. Amovig sent to pharmacy. He was educated on potential side effects. We will continue topiramate at 200mg  daily dosing. I have advised that he follow up in 3-6 months to determine response to medication and consider additional therapy as needed. He verbalizes understanding and agreement with plan.    No orders of the defined types were placed in this  encounter.   Meds ordered this encounter  Medications   Erenumab-aooe 140 MG/ML SOAJ    Sig: Inject 140 mg into the skin every 30 (thirty) days.    Dispense:  1 pen    Refill:  11    Order Specific Question:   Supervising Provider    Answer:   [name] [[id number]]   DISCONTD: topiramate (TOPAMAX) 200 MG tablet    Sig: Take 1 tablet (200 mg total) by mouth daily.    Dispense:  90 tablet    Refill:  3    Order Specific Question:   Supervising Provider    Answer:   [name] [[id number]]   topiramate (TOPAMAX) 200 MG tablet    Sig: Take 1 tablet (200 mg total) by mouth daily.     Dispense:  90 tablet    Refill:  3    Order Specific Question:   Supervising Provider    Answer:   [name] [id number]     Follow Up Instructions:  I discussed the assessment and treatment plan with the patient. The patient was provided an opportunity to ask questions and all were answered. The patient agreed with the plan and demonstrated an understanding of the instructions.   The patient was advised to call back or seek an in-person evaluation if the symptoms worsen or if the condition fails to improve as anticipated.  I provided 25 minutes of non-face-to-face time during this encounter.   Shawnie Dapper, NP

## 2018-05-16 DIAGNOSIS — G4733 Obstructive sleep apnea (adult) (pediatric): Secondary | ICD-10-CM | POA: Diagnosis not present

## 2018-05-29 DIAGNOSIS — Z0000 Encounter for general adult medical examination without abnormal findings: Secondary | ICD-10-CM | POA: Diagnosis not present

## 2018-05-29 DIAGNOSIS — E039 Hypothyroidism, unspecified: Secondary | ICD-10-CM | POA: Diagnosis not present

## 2018-05-29 DIAGNOSIS — E782 Mixed hyperlipidemia: Secondary | ICD-10-CM | POA: Diagnosis not present

## 2018-05-29 DIAGNOSIS — G43909 Migraine, unspecified, not intractable, without status migrainosus: Secondary | ICD-10-CM | POA: Diagnosis not present

## 2018-05-29 DIAGNOSIS — G4700 Insomnia, unspecified: Secondary | ICD-10-CM | POA: Diagnosis not present

## 2018-05-31 DIAGNOSIS — Z125 Encounter for screening for malignant neoplasm of prostate: Secondary | ICD-10-CM | POA: Diagnosis not present

## 2018-05-31 DIAGNOSIS — E782 Mixed hyperlipidemia: Secondary | ICD-10-CM | POA: Diagnosis not present

## 2018-05-31 DIAGNOSIS — E039 Hypothyroidism, unspecified: Secondary | ICD-10-CM | POA: Diagnosis not present

## 2018-05-31 DIAGNOSIS — Z1159 Encounter for screening for other viral diseases: Secondary | ICD-10-CM | POA: Diagnosis not present

## 2018-06-04 ENCOUNTER — Other Ambulatory Visit: Payer: Self-pay

## 2018-06-04 ENCOUNTER — Ambulatory Visit (INDEPENDENT_AMBULATORY_CARE_PROVIDER_SITE_OTHER): Payer: BLUE CROSS/BLUE SHIELD | Admitting: Sports Medicine

## 2018-06-04 ENCOUNTER — Encounter: Payer: Self-pay | Admitting: Sports Medicine

## 2018-06-04 VITALS — BP 129/84 | Ht 72.0 in | Wt 231.0 lb

## 2018-06-04 DIAGNOSIS — M7061 Trochanteric bursitis, right hip: Secondary | ICD-10-CM | POA: Diagnosis not present

## 2018-06-04 DIAGNOSIS — M7062 Trochanteric bursitis, left hip: Secondary | ICD-10-CM

## 2018-06-04 DIAGNOSIS — M249 Joint derangement, unspecified: Secondary | ICD-10-CM | POA: Diagnosis not present

## 2018-06-04 MED ORDER — METHYLPREDNISOLONE ACETATE 40 MG/ML IJ SUSP
40.0000 mg | Freq: Once | INTRAMUSCULAR | Status: AC
  Administered 2018-06-04: 40 mg via INTRA_ARTICULAR

## 2018-06-04 NOTE — Progress Notes (Signed)
HPI  CC: Bilateral hip pain  Joseph Fuentes is a 63-year-old male presents for bilateral hip pain and low back pain.  He states the hip pain is been going on for a couple weeks.  He states that the pain is worse when he plays golf.  He states that around 11 holes he begins noticed the pain worsening.  He states is also made worse at nighttime when he sleeps.  He states he has trouble laying on his side.  The pain is on the lateral side of both hips.  He denies any pain with internal rotation of his hips.  He denies any numbness and tingling in his leg.  Denies any weakness of his legs.  Denies any radiating pain down to his feet.  He states that when he plays golf he takes hydrocodone.  Seems to help.  He states he also takes ibuprofen throughout the day for his hip pain.  He denies any fevers or chills.  He denies any swelling of the lower extremities.  He also reports some low back pain.  This pain is located over his right SI joint.  He states this is worse with similar activities to the hip.  He had an epidural steroid injection in January, which she states helped with his low back pain.  See HPI and/or previous note for associated ROS.  Objective: BP 129/84   Ht 6' (1.829 m)   Wt 231 lb (104.8 kg)   BMI 31.33 kg/m  Gen: Right-Hand Dominant. NAD, well groomed, a/o x3, normal affect.  CV: Well-perfused. Warm.  Resp: Non-labored.  Neuro: Sensation intact throughout. No gross coordination deficits.  Gait: Nonpathologic posture, unremarkable stride without signs of limp or balance issues.  Back exam: No erythema, warmth, swelling noted.  Tenderness palpation over the right SI joint.  Full range of motion in forward flexion, extension, cytocide motion.  Strength out of 5 throughout lumbar spine testing.  Negative straight leg raise.  Left hip exam: No erythema, warmth, swelling noted.  Tenderness palpation of the posterior aspect of the greater trochanter.  Full range of motion hip flexion,  extension, abduction, abduction.  Strength 4+ out of 5 in hip abduction.  Strength 5 out of 5 throughout the rest of testing.  Negative logroll.  Positive FABER.  Negative FADIR testing.  Negative SLR.  Right hip exam: No erythema, warmth, swelling noted.  Tenderness palpation of the posterior aspect of the greater trochanter.  Full range of motion hip flexion, extension, abduction, abduction.  Strength 4+ out of 5 in hip abduction.  Strength 5 out of 5 throughout the rest of testing.  Negative logroll.  Positive FABER.  Negative FADIR testing.  Negative SLR.  Assessment and plan: 1.  Bilateral greater trochanteric bursitis 2.  SI joint dysfunction  INJECTION: Patient was given informed consent, signed copy in the chart. Appropriate time out was taken. Area prepped and draped in usual sterile fashion.  1 cc of methylprednisolone 40 mg/ml plus 3 cc of 1% lidocaine without epinephrine was injected into the left greater trochanter using a(n) lateral approach. The patient tolerated the procedure well. There were no complications. Post procedure instructions were given.  INJECTION: Patient was given informed consent, signed copy in the chart. Appropriate time out was taken. Area prepped and draped in usual sterile fashion.  1 cc of methylprednisolone 40 mg/ml plus 3 cc of 1% lidocaine without epinephrine was injected into the right greater trochanter using a(n) lateral approach. The patient tolerated the procedure  well. There were no complications. Post procedure instructions were given.  Treatment options discussed today with patient.  I gave him bilateral greater trochanteric injections as noted above.  He tolerated procedure well.  Also gave him some home exercises to work on hip abductor strengthening.  As far as the SI joint goes, we will reassess after steroid injections.  He might get some benefit from this tear injections in his hip.  If he continues have SI joint dysfunction, would consider doing  injection in that area as well.  We will see him back in 6 weeks.  Joseph QuanBlake Yovan Leeman, MD [facility] Sports Medicine Fellow 06/04/2018 4:35 PM  Patient seen and evaluated with the sports medicine fellow.  I agree with the above plan of care.  Bilateral greater trochanteric injections performed today.  Patient tolerated these without difficulty.  He continues to also have low back pain.  He has had good response to epidural injections in the past.  We could consider a repeat injection in the future if needed.  I also talked about the possibility of a referral to either Dr. Danielle DessElsner or Dr. Yevette Edwardsumonski.  Follow-up as needed.

## 2018-06-05 ENCOUNTER — Encounter: Payer: Self-pay | Admitting: Sports Medicine

## 2018-06-18 DIAGNOSIS — G4733 Obstructive sleep apnea (adult) (pediatric): Secondary | ICD-10-CM | POA: Diagnosis not present

## 2018-07-18 DIAGNOSIS — G4733 Obstructive sleep apnea (adult) (pediatric): Secondary | ICD-10-CM | POA: Diagnosis not present

## 2018-07-24 ENCOUNTER — Ambulatory Visit: Payer: BLUE CROSS/BLUE SHIELD | Admitting: Family Medicine

## 2018-08-19 DIAGNOSIS — G4733 Obstructive sleep apnea (adult) (pediatric): Secondary | ICD-10-CM | POA: Diagnosis not present

## 2018-08-22 DIAGNOSIS — R351 Nocturia: Secondary | ICD-10-CM | POA: Diagnosis not present

## 2018-08-22 DIAGNOSIS — R350 Frequency of micturition: Secondary | ICD-10-CM | POA: Diagnosis not present

## 2018-08-29 ENCOUNTER — Telehealth: Payer: Self-pay

## 2018-08-29 NOTE — Telephone Encounter (Signed)
Patient called back states disregard message

## 2018-08-29 NOTE — Telephone Encounter (Signed)
Patient is calling to get a refill on both of his nasal sprays to [facility]

## 2018-09-24 DIAGNOSIS — G4733 Obstructive sleep apnea (adult) (pediatric): Secondary | ICD-10-CM | POA: Diagnosis not present

## 2018-09-25 DIAGNOSIS — G629 Polyneuropathy, unspecified: Secondary | ICD-10-CM | POA: Diagnosis not present

## 2018-09-25 DIAGNOSIS — M545 Low back pain: Secondary | ICD-10-CM | POA: Diagnosis not present

## 2018-09-25 DIAGNOSIS — R202 Paresthesia of skin: Secondary | ICD-10-CM | POA: Diagnosis not present

## 2018-11-01 DIAGNOSIS — G4733 Obstructive sleep apnea (adult) (pediatric): Secondary | ICD-10-CM | POA: Diagnosis not present

## 2018-11-06 ENCOUNTER — Institutional Professional Consult (permissible substitution): Payer: BC Managed Care – PPO | Admitting: Neurology

## 2018-11-12 NOTE — Progress Notes (Signed)
[facility]    Provider:  Dr Jaynee Eagles Referring Provider: Maury Dus, MD Primary Care Physician:  Maury Dus, MD  CC:  Numbness in feet  Patient is here as a new request from Dr. Mariea Clonts for new symptoms including numbness and pain in feet for years.He has a hx of uncontrolled migraines, morbid obesity, OSA on cpap, hypothyroidism, insomnia, peripheral neuropathy.  Patient was seen here in the past for migraines, he reduced Topamax due to concern for memory loss, he was started on Emgality which did not help and he was switched to [place] at last appointment and finally back to topamax and doing well.  He was referred  in the past for formal neurocognitive testing  and 1 of the neuropsychologist here in town declined the referral.   MRI Brain in November 2019 was largely unremarkable, normal for age.  His wife is here and provides much information. He has had feet feeling cold and had a test for circulation which was good. He wears socks when he goes to bed. Not cold to the touch but feels cold. Started 2 years ago. No inciting events. He is having new sharp pain shppting down his leg searing to his feet lasting 20-30 seconds, when sleeping, coming from his hips. He can't sleep on his back or stomach due to gerd so he sleeps on his sides and he feels pain in the lower legs on the top of the foot possibly coming from the hips however unclear but the hips do hurt. Hips will hurt when he walks long distance. He had shots in the hips and helped with the hips pain. He plays golf and his hips are ok its just when he lays down on his sides after playing golf.   Interval history 11/21/2017: New referral from Dr. Maury Dus. He has a hx of uncontrolled migraines, morbid obesity, OSA on cpap, hypothyroidism, insomnia, peripheral neuropathy.[name] He is on Topamax '200mg'$  daily. Also on xanax, ambien. Here with his wife who also provides information. The biggest thing wife has noticed is that  he  will drive down the road and then forgets where he is going for 4-5 seconds, panics. He is having trouble remembering how to do his job that he has been doing for 30 years. He doesn't remember directions which is something he has always been very good at. He went to [place] and after he got there he didn't feel comfortable in a congested area. He sometimes doesn't know where he is even in familiar places in the car.  HE pays the bills and manages the finances. Dad is in memory care with severe dementia possibly alzheimers, started displaying in early 25s. Maternal grandmother alzheimers in early 76s. He is compliant but hasn't had it checked for years, he is very tired and never feels refreshed. He will bring in his chip so we can download only getting 5 hours sleep. No other focal neurologic deficits, associated symptoms, inciting events or modifiable factors.  Previous visit:  Riven Mabile is a 63 y.o. male here as a referral from Dr. Alyson Ingles for migraines. PMHx migraines, HLD, IBS, hypothyroidism, insomnia, peripheral neuropathy. He has had migraines all his life. He started having migraines at the age of 34 or 25, a celiac diet helped when he was young. As he got older he didn't adhere to the diet. His baseline was 3-4 a month with exacerbations of 3 a week. After losing weight 30 pounds he didn't have many headaches during that timeframe.  The last 6 months his migraines have been worse, no inciting events except when his diet changed. Migraines start in the right cheek and radiates to the back of the right eye and temple and whole side of the right face. Relpax helps if he catches it. 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 Ice pack helps. No auras or flashing lights. No prodrome. They can occur in the mid morning or later in the evening. He has insomnia at least one night a week. He takes [place] every night and still  some nights.he has headaches every day and most are migainous. He takes excedrin 6x a month, relpax 15x a month and ibuprofen 10x a month. No other focal neurologic deficits, associated symptoms, inciting events or modifiable factors.  Migraine medications tried: Topiramate, relpax  Reviewed notes, labs and imaging from outside physicians, which showed:  Reviewed primary care notes: He asked for 180 pills of Relpax, insurance only willing to dispense 36 tabs. Plan usually does not cover more than 12 tabs per month and will have to pay out of [place]cket for anything over that. Then patient said that he doesn't really need 180 Relpax every 90 days however after going for urine half without significant migraines his headaches have returned and now he's backed needing at least 72 tablets every 90 days. 72 tablets of Relpax were sent to the patient's pharmacy. He is on topiramate 150 mg at night, also Xanax, ibuprofen 800 mg 3 times a day, Excedrin extra strength when necessary per med list. He is maintained on Topamax and back in July 2017 he was saying he was having 3-4 migraines per month. He uses Relpax with good response. He also has insomnia and currently taking Ambien. I reviewed exam which included normal general, head, eyes, ears, nose, throat, neck, chest, lungs, heart, extremities, skin.   CMP normal 03/24/2015 BUN 22 and creatinine 1.19, CBC 02/15/2015 normal  CT maxillofacial 04/23/2008 personally reviewed report:  Comparison: None   Findings: Slight inflammatory enlargement right inferior nasal turbinates seen.  Minimal 57m and 3 mm posterior nasal septal mucosal inflammatory polyps are seen.  9 mm mucous retention cyst is seen at the anteromedial inferior left maxillary antrum with paranasal sinuses otherwise clear.  Bilateral infundibuli appear widely patent.  No other significant abnormalities seen. 9 mm mucous retention cyst is seen at the anterior inferior left maxillary antrum (axial  image 37).  Visualized paranasal sinuses are otherwise clear.  Middle ear cavities, mastoid air cells appear clear.   IMPRESSION:   1.  Minimal rhinitis findings and 9 mm left maxillary mucous retention cyst as described. 2.  Clear paranasal sinuses. 3.  No additional significant abnormality  Review of Systems: Patient complains of symptoms per HPI as well as the following symptoms: Feeling cold, allergies, runny nose, headache, insomnia. Pertinent negatives per HPI. All others negative.   Social History   Socioeconomic History   Marital status: Married    Spouse name: Not on file   Number of children: 2   Years of education: 14   Highest education level: Associate degree: academic program  Occupational History   Occupation: pMerchandiser, retailstrain: Not on file   Food insecurity    Worry: Not on file    Inability: Not on file   Transportation needs    Medical: Not on file    Non-medical: Not on  file  Tobacco Use   Smoking status: Former Smoker    Types: Cigarettes    Quit date: 06/28/2006    Years since quitting: 12.3   Smokeless tobacco: Never Used  Substance and Sexual Activity   Alcohol use: Not Currently    Comment: social, during bowling season   Drug use: Not Currently    Comment: none in decades    Sexual activity: Not on file  Lifestyle   Physical activity    Days per week: Not on file    Minutes per session: Not on file   Stress: Not on file  Relationships   Social connections    Talks on phone: Not on file    Gets together: Not on file    Attends religious service: Not on file    Active member of club or organization: Not on file    Attends meetings of clubs or organizations: Not on file    Relationship status: Not on file   Intimate partner violence    Fear of current or ex partner: Not on file    Emotionally abused: Not on file    Physically abused: Not on file    Forced sexual activity: Not on file   Other Topics Concern   Not on file  Social History Narrative   Lives at home with his wife   Left handed    Family History  Problem Relation Age of Onset   Alzheimer's disease Father        severe dementia   High Cholesterol Father    Gout Father    Other Father        back problems   Migraines Other        strong maternal fam hx    High blood pressure Mother    Lung cancer Mother        54   Neuropathy Mother    Gout Mother     Past Medical History:  Diagnosis Date   Allergic rhinitis due to dust    Anxiety    BPH (benign prostatic hyperplasia)    Bulging lumbar disc    Celiac disease/sprue    Concussion    Deviated septum    Dyshidrotic eczema    Gastroesophageal reflux    Hypothyroidism    IBS (irritable bowel syndrome)    Insomnia    Low back pain    Migraines    Nephrolithiasis    Neuropathy    Onychomycosis    OSA (obstructive sleep apnea)    Osteopenia    Peripheral neuropathy    Rheumatic fever    63 y.o.   Seborrheic keratosis    Shingles     Past Surgical History:  Procedure Laterality Date   arthroscopic knee surgery Left    COLONOSCOPY     SEPTOPLASTY     TENNIS ELBOW RELEASE/NIRSCHEL PROCEDURE     VASECTOMY      Current Outpatient Medications  Medication Sig Dispense Refill   ALLEGRA-D ALLERGY & CONGESTION 180-240 MG 24 hr tablet Take 1 tablet by mouth daily as needed.  3   ALPRAZolam (XANAX) 0.5 MG tablet 0.5-1 tablet by mouth as needed.     APPLE CIDER VINEGAR PO Take 2 tablets by mouth 2 (two) times daily.     aspirin-acetaminophen-caffeine (EXCEDRIN MIGRAINE) 250-250-65 MG tablet Take by mouth every 6 (six) hours as needed for headache.     desoximetasone (TOPICORT) 0.25 % cream Apply 1 application topically daily as needed.  DEXILANT 60 MG capsule Take 60 mg by mouth every other day.     docusate sodium (COLACE) 100 MG capsule Take 100 mg by mouth daily.      eletriptan (RELPAX)  40 MG tablet One tablet by mouth at onset of headache. May repeat in 2 hours if headache persists or recurs.      fluticasone (FLONASE) 50 MCG/ACT nasal spray Use one to two sprays in each nostril once daily. 48 g 3   HYDROcodone-acetaminophen (NORCO) 10-325 MG tablet Take 1 tablet by mouth as needed.      ibuprofen (ADVIL,MOTRIN) 600 MG tablet Take 600 mg by mouth as needed.     ipratropium (ATROVENT) 0.06 % nasal spray Place 1 spray into the nose 2 (two) times daily. 45 mL 3   levothyroxine (SYNTHROID) 300 MCG tablet Take 300 mcg by mouth daily.     Multiple Vitamins-Minerals (MULTIVITAMIN PO) Take by mouth.     NAFTIN 2 % CREA as needed.     naproxen sodium (ANAPROX) 550 MG tablet Take 550 mg by mouth as needed.     omeprazole (PRILOSEC) 40 MG capsule Take 1-2 tablets by mouth Daily.     POTASSIUM PO Take 99 mg by mouth.      tamsulosin (FLOMAX) 0.4 MG CAPS capsule 1 capsule 30 minutes after the same meal each day     topiramate (TOPAMAX) 200 MG tablet Take 1 tablet (200 mg total) by mouth daily. 90 tablet 3   triamcinolone ointment (KENALOG) 0.1 % as needed.     WELCHOL 625 MG tablet Take 3 tablets by mouth Twice daily.     zolpidem (AMBIEN CR) 12.5 MG CR tablet      Galcanezumab-gnlm (EMGALITY) 120 MG/ML SOAJ Inject 120 mg into the skin every 30 (thirty) days. (Patient not taking: Reported on 11/13/2018) 1 pen 11   No current facility-administered medications for this visit.     Allergies as of 11/13/2018 - Review Complete 11/13/2018  Allergen Reaction Noted   Penicillins     Terbinafine and related  06/04/2018    Vitals: BP 125/79 (BP Location: Right Arm, Patient Position: Sitting)    Pulse 69    Temp 97.7 F (36.5 C) Comment: 98.6 wife; both taken at front door   Ht 6' (1.829 m)    Wt 233 lb (105.7 kg)    BMI 31.60 kg/m  Last Weight:  Wt Readings from Last 1 Encounters:  11/13/18 233 lb (105.7 kg)   Last Height:   Ht Readings from Last 1 Encounters:   11/13/18 6' (1.829 m)    Physical exam: Exam: Gen: NAD, conversant, well nourised, obese, well groomed                     CV: RRR, no MRG. No Carotid Bruits. No peripheral edema, warm, nontender Eyes: Conjunctivae clear without exudates or hemorrhage  Neuro: Detailed Neurologic Exam  Speech:    Speech is normal; fluent and spontaneous with normal comprehension.  Cognition:    The patient is oriented to person, place, and time;     recent and remote memory impaired;     language fluent;     Impaired attention, concentration, fund of knowledge Cranial Nerves:    The pupils are equal, round, and reactive to light.  Visual fields are full to finger confrontation. Extraocular movements are intact. Trigeminal sensation is intact and the muscles of mastication are normal. The face is symmetric. The palate elevates in the  midline. Hearing intact. Voice is normal. Shoulder shrug is normal. The tongue has normal motion without fasciculations.   Coordination:    Normal finger to nose and heel to shin. .   Gait:  normal native gait  Motor Observation:    No asymmetry, no atrophy, and no involuntary movements noted. Tone:    Normal muscle tone.    Posture:    Posture is normal. normal erect    Strength:    Strength is V/V in the upper and lower limbs.      Sensation: intact to LT     Reflex Exam:  DTR's:    Deep tendon reflexes in the upper and lower extremities are normal bilaterally.  The right AJ is slightly less than the rleft but both are intact. Toes:    The toes are downgoing bilaterally.   Clonus:    Clonus is absent.      Assessment/Plan:  63 y.o. male here as a referral from Dr. Tollie Pizza for peripheral neuropathy.. He reports numbness started in the toes 2-3 years ago. Discussed the causes of peripheral neuropathy, the most common being diabetes which patient does not report having. About 20 million people in the [place] states have some form of peripheral  neuropathy. This is a condition that develops as a result of damage to the peripheral nervous system. 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 There are multiple causes including metabolic, toxic, infectious and endocrine disorders, small vessel disease, autoimmune diseases, and others. We'll perform serum neuropathy screen. EMG/NCS would like be normal as small-fiber neuropathy cannot be assessed by this examination. He has degenerative disease in his back with possible nerve impingement at several levels, he has osteoarthritis and hip pain; when he lays on his side in bed he will get a shooting pain into his lower legs to the top of the foot especially after he has played golf that day; he states it comes from his hips although not sure - differential includes L5/S1 radic, sciatic neuropathy (at the hips as the sciatic nerve travels through the notch vs piriformis or other sciatic neuropathy) also may be peroneal neuropathy as he lays on his side - we discussed at length and patient will pay more attention to the pattern of his pain and radiation  now that he knows that to look for.  Will order sever lab tests, wonder if he is diabetic or pre-diabetic given his family hx and his body habitus with a large abdomen.   Orders Placed This Encounter  Procedures   Hemoglobin A1c   B12 and Folate Panel   Methylmalonic acid, serum   Multiple Myeloma Panel (SPEP&IFE w/QIG)   Heavy metals, blood   Vitamin B6   Vitamin B1   Cc: Dr. Alyson Ingles  A total of 85 minutes was spent face-to-face with this patient. Over half this time was spent on counseling patient on the  1. Polyneuropathy    diagnosis and different diagnostic and therapeutic options, counseling and coordination of care,  risks ans benefits of management, compliance, or risk factor reduction and education.      Sarina Ill, MD  [facility] [address] [place], [place] [address]  Phone [phone] Fax [phone]

## 2018-11-13 ENCOUNTER — Other Ambulatory Visit: Payer: Self-pay

## 2018-11-13 ENCOUNTER — Encounter: Payer: Self-pay | Admitting: Neurology

## 2018-11-13 ENCOUNTER — Ambulatory Visit (INDEPENDENT_AMBULATORY_CARE_PROVIDER_SITE_OTHER): Payer: BC Managed Care – PPO | Admitting: Neurology

## 2018-11-13 VITALS — BP 125/79 | HR 69 | Temp 97.7°F | Ht 72.0 in | Wt 233.0 lb

## 2018-11-13 DIAGNOSIS — G589 Mononeuropathy, unspecified: Secondary | ICD-10-CM | POA: Diagnosis not present

## 2018-11-13 DIAGNOSIS — G629 Polyneuropathy, unspecified: Secondary | ICD-10-CM | POA: Diagnosis not present

## 2018-11-13 NOTE — Patient Instructions (Signed)

## 2018-11-18 ENCOUNTER — Telehealth: Payer: Self-pay

## 2018-11-18 ENCOUNTER — Other Ambulatory Visit: Payer: Self-pay | Admitting: Sports Medicine

## 2018-11-18 DIAGNOSIS — G8929 Other chronic pain: Secondary | ICD-10-CM

## 2018-11-18 DIAGNOSIS — M545 Low back pain, unspecified: Secondary | ICD-10-CM

## 2018-11-18 NOTE — Telephone Encounter (Signed)
Referral placed. [facility] will call pt to schedule appt.

## 2018-11-19 LAB — MULTIPLE MYELOMA PANEL, SERUM
Albumin SerPl Elph-Mcnc: 4.1 g/dL (ref 2.9–4.4)
Albumin/Glob SerPl: 1.3 (ref 0.7–1.7)
Alpha 1: 0.2 g/dL (ref 0.0–0.4)
Alpha2 Glob SerPl Elph-Mcnc: 0.8 g/dL (ref 0.4–1.0)
B-Globulin SerPl Elph-Mcnc: 1.2 g/dL (ref 0.7–1.3)
Gamma Glob SerPl Elph-Mcnc: 1 g/dL (ref 0.4–1.8)
Globulin, Total: 3.2 g/dL (ref 2.2–3.9)
IgA/Immunoglobulin A, Serum: 514 mg/dL — ABNORMAL HIGH (ref 61–437)
IgG (Immunoglobin G), Serum: 1140 mg/dL (ref 603–1613)
IgM (Immunoglobulin M), Srm: 78 mg/dL (ref 20–172)
Total Protein: 7.3 g/dL (ref 6.0–8.5)

## 2018-11-19 LAB — HEAVY METALS, BLOOD
Arsenic: 6 ug/L (ref 2–23)
Lead, Blood: <1 ug/dL (ref 0–4)
Mercury: <1 ug/L (ref 0.0–14.9)

## 2018-11-19 LAB — B12 AND FOLATE PANEL
Folate: 16.7 ng/mL (ref >3.0)
Vitamin B-12: 440 pg/mL (ref 232–1245)

## 2018-11-19 LAB — HEMOGLOBIN A1C
Est. average glucose Bld gHb Est-mCnc: 117 mg/dL
Hgb A1c MFr Bld: 5.7 % — ABNORMAL HIGH (ref 4.8–5.6)

## 2018-11-19 LAB — METHYLMALONIC ACID, SERUM: Methylmalonic Acid: 211 nmol/L (ref 0–378)

## 2018-11-19 LAB — VITAMIN B6: Vitamin B6: 26 ug/L (ref 5.3–46.7)

## 2018-11-19 LAB — VITAMIN B1: Thiamine: 130.9 nmol/L (ref 66.5–200.0)

## 2018-11-22 ENCOUNTER — Ambulatory Visit
Admission: RE | Admit: 2018-11-22 | Discharge: 2018-11-22 | Disposition: A | Discharge status: Home / Self Care | Payer: BLUE CROSS/BLUE SHIELD | Source: Ambulatory Visit | Attending: Sports Medicine | Admitting: Sports Medicine

## 2018-11-22 ENCOUNTER — Other Ambulatory Visit: Payer: Self-pay

## 2018-11-22 DIAGNOSIS — M545 Low back pain, unspecified: Secondary | ICD-10-CM

## 2018-11-22 DIAGNOSIS — G8929 Other chronic pain: Secondary | ICD-10-CM

## 2018-11-22 MED ORDER — METHYLPREDNISOLONE ACETATE 40 MG/ML INJ SUSP (RADIOLOG
120.0000 mg | Freq: Once | INTRAMUSCULAR | Status: AC
  Administered 2018-11-22: 120 mg via EPIDURAL

## 2018-11-22 MED ORDER — IOPAMIDOL (ISOVUE-M 200) INJECTION 41%
1.0000 mL | Freq: Once | INTRAMUSCULAR | Status: AC
  Administered 2018-11-22: 1 mL via EPIDURAL

## 2018-11-22 MED ORDER — DIAZEPAM 5 MG PO TABS
5.0000 mg | ORAL_TABLET | Freq: Once | ORAL | Status: AC
  Administered 2018-11-22: 13:00:00 5 mg via ORAL

## 2018-11-22 NOTE — Discharge Instructions (Signed)

## 2018-11-26 ENCOUNTER — Ambulatory Visit (INDEPENDENT_AMBULATORY_CARE_PROVIDER_SITE_OTHER): Payer: BLUE CROSS/BLUE SHIELD | Admitting: Sports Medicine

## 2018-11-26 ENCOUNTER — Other Ambulatory Visit: Payer: Self-pay

## 2018-11-26 VITALS — BP 130/82 | Ht 72.0 in | Wt 232.0 lb

## 2018-11-26 DIAGNOSIS — M7061 Trochanteric bursitis, right hip: Secondary | ICD-10-CM

## 2018-11-26 DIAGNOSIS — M7062 Trochanteric bursitis, left hip: Secondary | ICD-10-CM

## 2018-11-27 NOTE — Progress Notes (Signed)
   Subjective:    Patient ID: Joseph Fuentes, male    DOB: [birth year], 63 y.o.   MRN: [id number]  HPI chief complaint: Bilateral hip pain and right knee pain  Joseph Fuentes comes in today requesting repeat injections into both hips.  He has a history of greater trochanteric bursitis and received great benefit from injections in May of this year.  He admits that he has stopped doing his home exercises however.  Pain is identical in nature to what he experienced previously.  All of his pain is in the lateral hips.  No pain in the groin.  No recent trauma but he does state that he was playing quite a bit of golf up until recently.[name]  He takes 800 mg of ibuprofen which has been helpful.  He also endorses right knee pain that began without any inciting event.  Pain is diffuse.  No mechanical symptoms.  However today his knee pain is minimal.  Interim medical history reviewed Medications reviewed Allergies reviewed  Review of Systems    As above Objective:   Physical Exam  Well-developed, well-nourished.  No acute distress.  Awake alert and oriented x3.  Vital signs reviewed.  Examination of both hips show smooth painless hip range of motion with a negative logroll.  He is tender to palpation over the greater trochanter of both hips.  Good strength.  Examination of the right knee shows full range of motion.  No effusion.  Good joint stability.  No tenderness to palpation.      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  120 mg of Depo-Medrol were injected during that procedure.  The patient has noticed some benefit in reducing his hip pain and his right knee pain.  I recommended that we hold off on injections for now.  I explained to the patient that there is a systemic effect from injections and he may  notice that his hip pain and knee pain resolve as a result of his recent epidural steroid injection.  I think he should also cut back on golfing for the next several weeks.  He understands the importance of resuming his home exercises and I will plan on seeing him back in the office in 4 weeks for reevaluation.  If he is continuing to have pain at that time then we will reconsider injection.

## 2018-12-03 DIAGNOSIS — G4733 Obstructive sleep apnea (adult) (pediatric): Secondary | ICD-10-CM | POA: Diagnosis not present

## 2018-12-11 ENCOUNTER — Other Ambulatory Visit: Payer: Self-pay | Admitting: Allergy and Immunology

## 2018-12-24 ENCOUNTER — Ambulatory Visit (INDEPENDENT_AMBULATORY_CARE_PROVIDER_SITE_OTHER): Payer: BC Managed Care – PPO | Admitting: Sports Medicine

## 2018-12-24 ENCOUNTER — Other Ambulatory Visit: Payer: Self-pay

## 2018-12-24 VITALS — BP 120/82 | Ht 72.0 in | Wt 235.0 lb

## 2018-12-24 DIAGNOSIS — M7061 Trochanteric bursitis, right hip: Secondary | ICD-10-CM

## 2018-12-24 DIAGNOSIS — M7062 Trochanteric bursitis, left hip: Secondary | ICD-10-CM

## 2018-12-24 MED ORDER — METHYLPREDNISOLONE ACETATE 40 MG/ML IJ SUSP
40.0000 mg | Freq: Once | INTRAMUSCULAR | Status: AC
  Administered 2018-12-24: 40 mg via INTRA_ARTICULAR

## 2018-12-24 NOTE — Progress Notes (Signed)
   Subjective:    Patient ID: Joseph Fuentes, male    DOB: December 04, 1955, 63 y.o.   MRN: [id number]  HPI chief complaint: Bilateral hip pain  Patient comes in today for follow-up on bilateral greater trochanteric bursitis.  Recent lumbar ESI did not help his hip pain.  He endorses pain along the lateral aspect of his hips which is worse at night when trying to sleep.  Pain does not radiate.  We have discussed injections at his last visit if he received no benefit from his lumbar ESI.    Review of Systems As above    Objective:   Physical Exam  Well-developed, well-nourished.  No acute distress.  Awake alert and oriented x3.  Vital signs reviewed.  Examination of both hips show smooth painless hip range of motion with a negative logroll.  He is tender to palpation directly over the greater trochanteric area of both hips.  Neurovascularly intact distally.      Assessment & Plan:   Bilateral hip pain secondary to greater trochanteric bursitis  Each of his hips were injected today with cortisone.  This was done after risks and benefits were explained.  Patient tolerated this without difficulty.  He understands the importance of continuing with his home exercises.  I did discuss formal physical therapy if symptoms persist.  Follow-up for ongoing or recalcitrant issues.  Consent obtained and verified. Time-out conducted. Noted no overlying erythema, induration, or other signs of local infection. Skin prepped in a sterile fashion. Topical analgesic spray: Ethyl chloride. Joint: right hip greater troch Needle: 25g 1.5 inch Completed without difficulty. Meds: 3cc 1% xylocaine, 1cc (40mg ) depomedrol  Advised to call if fevers/chills, erythema, induration, drainage, or persistent bleeding.  Consent obtained and verified. Time-out conducted. Noted no overlying erythema, induration, or other signs of local infection. Skin prepped in a sterile fashion. Topical analgesic spray: Ethyl chloride.  Joint: left hip greater troch Needle: 25g 1.5 inch Completed without difficulty. Meds: 3cc 1% xylocaine, 1cc (40mg ) depomedrol  Advised to call if fevers/chills, erythema, induration, drainage, or persistent bleeding.

## 2019-01-01 DIAGNOSIS — L309 Dermatitis, unspecified: Secondary | ICD-10-CM | POA: Diagnosis not present

## 2019-01-01 DIAGNOSIS — L570 Actinic keratosis: Secondary | ICD-10-CM | POA: Diagnosis not present

## 2019-01-03 DIAGNOSIS — G4733 Obstructive sleep apnea (adult) (pediatric): Secondary | ICD-10-CM | POA: Diagnosis not present

## 2019-01-17 DIAGNOSIS — K219 Gastro-esophageal reflux disease without esophagitis: Secondary | ICD-10-CM | POA: Diagnosis not present

## 2019-02-03 DIAGNOSIS — G4733 Obstructive sleep apnea (adult) (pediatric): Secondary | ICD-10-CM | POA: Diagnosis not present

## 2019-02-27 ENCOUNTER — Other Ambulatory Visit: Payer: Self-pay | Admitting: Allergy and Immunology

## 2019-03-02 ENCOUNTER — Other Ambulatory Visit: Payer: Self-pay | Admitting: Allergy and Immunology

## 2019-03-04 ENCOUNTER — Other Ambulatory Visit: Payer: Self-pay | Admitting: Allergy and Immunology

## 2019-03-04 ENCOUNTER — Telehealth: Payer: Self-pay | Admitting: Allergy and Immunology

## 2019-03-04 MED ORDER — FLUTICASONE PROPIONATE 50 MCG/ACT NA SUSP: NASAL | 0 refills | Status: DC

## 2019-03-04 NOTE — Telephone Encounter (Signed)
Pt called and needs to have flonase and atrovent called to [facility] order. [phone].

## 2019-03-04 NOTE — Telephone Encounter (Signed)
Called and informed patient that Rx have been sent in and that he will need an OV for further refills. Patient verbalized understanding and has made an office visit for 04/08/2019.

## 2019-03-31 DIAGNOSIS — R109 Unspecified abdominal pain: Secondary | ICD-10-CM | POA: Diagnosis not present

## 2019-03-31 DIAGNOSIS — R509 Fever, unspecified: Secondary | ICD-10-CM | POA: Diagnosis not present

## 2019-03-31 DIAGNOSIS — J329 Chronic sinusitis, unspecified: Secondary | ICD-10-CM | POA: Diagnosis not present

## 2019-03-31 DIAGNOSIS — Z1152 Encounter for screening for COVID-19: Secondary | ICD-10-CM | POA: Diagnosis not present

## 2019-04-01 DIAGNOSIS — J0110 Acute frontal sinusitis, unspecified: Secondary | ICD-10-CM | POA: Diagnosis not present

## 2019-04-01 DIAGNOSIS — R197 Diarrhea, unspecified: Secondary | ICD-10-CM | POA: Diagnosis not present

## 2019-04-01 DIAGNOSIS — R509 Fever, unspecified: Secondary | ICD-10-CM | POA: Diagnosis not present

## 2019-04-02 DIAGNOSIS — R509 Fever, unspecified: Secondary | ICD-10-CM | POA: Diagnosis not present

## 2019-04-02 DIAGNOSIS — Z1152 Encounter for screening for COVID-19: Secondary | ICD-10-CM | POA: Diagnosis not present

## 2019-04-04 ENCOUNTER — Other Ambulatory Visit: Payer: Self-pay | Admitting: Family Medicine

## 2019-04-04 ENCOUNTER — Other Ambulatory Visit: Payer: Self-pay

## 2019-04-04 ENCOUNTER — Ambulatory Visit
Admission: RE | Admit: 2019-04-04 | Discharge: 2019-04-04 | Disposition: A | Discharge status: Home / Self Care | Payer: BC Managed Care – PPO | Source: Ambulatory Visit | Attending: Family Medicine | Admitting: Family Medicine

## 2019-04-04 DIAGNOSIS — R1084 Generalized abdominal pain: Secondary | ICD-10-CM

## 2019-04-04 DIAGNOSIS — N200 Calculus of kidney: Secondary | ICD-10-CM | POA: Diagnosis not present

## 2019-04-04 DIAGNOSIS — R197 Diarrhea, unspecified: Secondary | ICD-10-CM

## 2019-04-04 DIAGNOSIS — R509 Fever, unspecified: Secondary | ICD-10-CM

## 2019-04-04 MED ORDER — IOPAMIDOL (ISOVUE-300) INJECTION 61%
100.0000 mL | Freq: Once | INTRAVENOUS | Status: AC | PRN
  Administered 2019-04-04: 16:00:00 100 mL via INTRAVENOUS

## 2019-04-08 ENCOUNTER — Other Ambulatory Visit: Payer: Self-pay

## 2019-04-08 ENCOUNTER — Ambulatory Visit (INDEPENDENT_AMBULATORY_CARE_PROVIDER_SITE_OTHER): Payer: BC Managed Care – PPO | Admitting: Allergy and Immunology

## 2019-04-08 ENCOUNTER — Encounter: Payer: Self-pay | Admitting: Allergy and Immunology

## 2019-04-08 VITALS — BP 124/78 | HR 69 | Temp 97.7°F | Resp 16

## 2019-04-08 DIAGNOSIS — J3089 Other allergic rhinitis: Secondary | ICD-10-CM

## 2019-04-08 MED ORDER — MUPIROCIN CALCIUM 2 % EX CREA: TOPICAL_CREAM | CUTANEOUS | 0 refills | Status: DC

## 2019-04-08 NOTE — Progress Notes (Signed)
[place]   Follow-up Note  Referring Provider: Maury Dus, MD Primary Provider: Maury Dus, MD Date of Office Visit: 04/08/2019  Subjective:   Joseph Fuentes (DOB: Nov 13, 1955) is a 64 y.o. male who returns to the [facility] on 04/08/2019 in re-evaluation of the following:  HPI: [name] returns to this clinic in evaluation of allergic rhinoconjunctivitis.  His last visit to this clinic was 19 February 2018.  He was doing wonderful up until the last month or so.  He has noticed that over the course of the past month he has developed left-sided nasal congestion especially at nighttime.  It is interfering with his CPAP use.  He has been using his nasal Flonase and nasal ipratropium on a consistent basis.  He does have a history of a turbinate reduction performed in the past.  He is presently being treated with Levaquin on his sixth day of a 10-day course.  He is not sure that this is helped him to any degree.  He has very good control of his reflux as long as he uses his proton pump inhibitor.  He is receiving his Covid vaccination next week.  Allergies as of 04/08/2019      Reactions   Penicillins    Terbinafine And Related       Medication List    Allegra-D Allergy & Congestion 180-240 MG 24 hr tablet Generic drug: fexofenadine-pseudoephedrine Take 1 tablet by mouth daily as needed.   ALPRAZolam 0.5 MG tablet Commonly known as: XANAX 0.5-1 tablet by mouth as needed.   aspirin-acetaminophen-caffeine 093-235-57 MG tablet Commonly known as: EXCEDRIN MIGRAINE Take by mouth every 6 (six) hours as needed for headache.   desoximetasone 0.25 % cream Commonly known as: TOPICORT Apply 1 application topically daily as needed.   Dexilant 60 MG capsule Generic drug: dexlansoprazole Take 60 mg by mouth every other day.   docusate sodium 100 MG capsule Commonly known as: COLACE Take 100 mg by mouth daily.    eletriptan 40 MG tablet Commonly known as: RELPAX One tablet by mouth at onset of headache. May repeat in 2 hours if headache persists or recurs.   Flomax 0.4 MG Caps capsule Generic drug: tamsulosin 1 capsule 30 minutes after the same meal each day   fluticasone 50 MCG/ACT nasal spray Commonly known as: FLONASE USE 1 TO 2 SPRAYS IN EACH  NOSTRIL ONCE DAILY   Galcanezumab-gnlm 120 MG/ML Soaj Commonly known as: Emgality Inject 120 mg into the skin every 30 (thirty) days.   HYDROcodone-acetaminophen 10-325 MG tablet Commonly known as: NORCO Take 1 tablet by mouth as needed.   ibuprofen 600 MG tablet Commonly known as: ADVIL Take 600 mg by mouth as needed.   ipratropium 0.06 % nasal spray Commonly known as: ATROVENT USE 1 SPRAY NASALLY TWICE  DAILY   MULTIVITAMIN PO Take by mouth.   Naftin 2 % Crea Generic drug: Naftifine HCl as needed.   naproxen sodium 550 MG tablet Commonly known as: ANAPROX Take 550 mg by mouth as needed.   omeprazole 40 MG capsule Commonly known as: PRILOSEC Take 1-2 tablets by mouth Daily.   POTASSIUM PO Take 99 mg by mouth.   Synthroid 300 MCG tablet Generic drug: levothyroxine Take 300 mcg by mouth daily.   topiramate 200 MG tablet Commonly known as: TOPAMAX Take 1 tablet (200 mg total) by mouth daily.   triamcinolone ointment 0.1 % Commonly known as: KENALOG as needed.  Welchol 625 MG tablet Generic drug: colesevelam Take 3 tablets by mouth Twice daily.   zolpidem 12.5 MG CR tablet Commonly known as: AMBIEN CR       Past Medical History:  Diagnosis Date  . Allergic rhinitis due to dust   . Anxiety   . BPH (benign prostatic hyperplasia)   . Bulging lumbar disc   . Celiac disease/sprue   . Concussion   . Deviated septum   . Dyshidrotic eczema   . Gastroesophageal reflux   . Hypothyroidism   . IBS (irritable bowel syndrome)   . Insomnia   . Low back pain   . Migraines   . Nephrolithiasis   . Neuropathy   .  Onychomycosis   . OSA (obstructive sleep apnea)   . Osteopenia   . Peripheral neuropathy   . Rheumatic fever    64 y.o.  . Seborrheic keratosis   . Shingles     Past Surgical History:  Procedure Laterality Date  . arthroscopic knee surgery Left   . COLONOSCOPY    . SEPTOPLASTY    . TENNIS ELBOW RELEASE/NIRSCHEL PROCEDURE    . VASECTOMY      Review of systems negative except as noted in HPI / PMHx or noted below:  Review of Systems  Constitutional: Negative.   HENT: Negative.   Eyes: Negative.   Respiratory: Negative.   Cardiovascular: Negative.   Gastrointestinal: Negative.   Genitourinary: Negative.   Musculoskeletal: Negative.   Skin: Negative.   Neurological: Negative.   Endo/Heme/Allergies: Negative.   Psychiatric/Behavioral: Negative.      Objective:   Vitals:   04/08/19 1544  BP: 124/78  Pulse: 69  Resp: 16  Temp: 97.7 F (36.5 C)  SpO2: 98%          Physical Exam Constitutional:      Appearance: He is not diaphoretic.  HENT:     Head: Normocephalic.     Right Ear: Tympanic membrane, ear canal and external ear normal.     Left Ear: Tympanic membrane, ear canal and external ear normal.     Nose: Nose normal. No mucosal edema (Dry, scaly, bloody, nasal mucosa bilaterally) or rhinorrhea.     Mouth/Throat:     Pharynx: Uvula midline. No oropharyngeal exudate.  Eyes:     Conjunctiva/sclera: Conjunctivae normal.  Neck:     Thyroid: No thyromegaly.     Trachea: Trachea normal. No tracheal tenderness or tracheal deviation.  Cardiovascular:     Rate and Rhythm: Normal rate and regular rhythm.     Heart sounds: Normal heart sounds, S1 normal and S2 normal. No murmur.  Pulmonary:     Effort: No respiratory distress.     Breath sounds: Normal breath sounds. No stridor. No wheezing or rales.  Lymphadenopathy:     Head:     Right side of head: No tonsillar adenopathy.     Left side of head: No tonsillar adenopathy.     Cervical: No cervical  adenopathy.  Skin:    Findings: No erythema or rash.     Nails: There is no clubbing.  Neurological:     Mental Status: He is alert.     Diagnostics: none  Assessment and Plan:   1. Other allergic rhinitis     1. Stop Flonase and nasal ipratropium  2. Nasal saline followed by bactroban in each nostril 3 times a day for 2 weeks  3. Return to clinic in 2 weeks or earlier if problem  I  am not exactly sure what caused Zephyr's nasal mucosa to produce its rather significantly inflamed and irritated and possibly infected state but we will treat him with a topical antibacterial agent and some nasal saline while he discontinues his Flonase and nasal ipratropium over the course of the next 2 weeks.  I will regroup with him at that point time to consider further evaluation and treatment based upon his response to this approach.  Laurette Schimke, MD Allergy / Immunology [facility]

## 2019-04-08 NOTE — Patient Instructions (Addendum)
  1. Stop Flonase and nasal ipratropium  2. Nasal saline followed by bactroban in each nostril 3 times a day for 2 weeks  3. Return to clinic in 2 weeks or earlier if problem

## 2019-04-09 ENCOUNTER — Encounter: Payer: Self-pay | Admitting: Allergy and Immunology

## 2019-04-10 ENCOUNTER — Ambulatory Visit: Payer: BC Managed Care – PPO | Attending: Internal Medicine

## 2019-04-10 DIAGNOSIS — Z23 Encounter for immunization: Secondary | ICD-10-CM

## 2019-04-10 NOTE — Progress Notes (Signed)
   Covid-19 Vaccination Clinic  Name:  Joseph Fuentes    MRN: [id number] DOB: 1955/06/02  04/10/2019  Joseph Fuentes was observed post Covid-19 immunization for 15 minutes without incident. He was provided with Vaccine Information Sheet and instruction to access the V-Safe system.   Joseph Fuentes was instructed to call 911 with any severe reactions post vaccine: [name] Difficulty breathing  . Swelling of face and throat  . A fast heartbeat  . A bad rash all over body  . Dizziness and weakness   Immunizations Administered    Name Date Dose VIS Date Route   Pfizer COVID-19 Vaccine 04/10/2019  2:09 PM 0.3 mL 01/10/2019 Intramuscular   Manufacturer: [employer], [employer]   Lot: [id number]   NDC: [address]-2

## 2019-04-22 ENCOUNTER — Ambulatory Visit: Payer: BC Managed Care – PPO | Admitting: Allergy and Immunology

## 2019-04-23 ENCOUNTER — Ambulatory Visit: Payer: BC Managed Care – PPO | Admitting: Family Medicine

## 2019-04-24 ENCOUNTER — Other Ambulatory Visit: Payer: Self-pay

## 2019-04-24 ENCOUNTER — Encounter: Payer: Self-pay | Admitting: Family Medicine

## 2019-04-24 ENCOUNTER — Ambulatory Visit (INDEPENDENT_AMBULATORY_CARE_PROVIDER_SITE_OTHER): Payer: BC Managed Care – PPO | Admitting: Family Medicine

## 2019-04-24 DIAGNOSIS — J3089 Other allergic rhinitis: Secondary | ICD-10-CM | POA: Diagnosis not present

## 2019-04-24 DIAGNOSIS — K219 Gastro-esophageal reflux disease without esophagitis: Secondary | ICD-10-CM

## 2019-04-24 NOTE — Patient Instructions (Addendum)
Allergic rhinitis Continue avoidance measures directed toward grass pollen, tree pollen, molds, cockroach, and dust mite Continue daily saline rinses When your nostrils have completely healed, begin Flonase 1-2 sprays in each nostril once a day as needed for a stuffy nose.  In the right nostril, point the applicator out toward the right ear. In the left nostril, point the applicator out toward the left ear Continue ipratropium nasal spray 1 spray in each nostril 1-2 times a day as needed for a runny nose  Reflux Continue omeprazole 40 mg 1-2 times a day  Continue dietary and lifestyle modifications  Call the clinic if this treatment plan is not working well for you  Follow up in 1 year or sooner if needed.  Reducing Pollen Exposure The American Academy of Allergy, Asthma and Immunology suggests the following steps to reduce your exposure to pollen during allergy seasons. 1. Do not hang sheets or clothing out to dry; pollen may collect on these items. 2. Do not mow lawns or spend time around freshly cut grass; mowing stirs up pollen. 3. Keep windows closed at night.  Keep car windows closed while driving. 4. Minimize morning activities outdoors, a time when pollen counts are usually at their highest. 5. Stay indoors as much as possible when pollen counts or humidity is high and on windy days when pollen tends to remain in the air longer. 6. Use air conditioning when possible.  Many air conditioners have filters that trap the pollen spores. 7. Use a HEPA room air filter to remove pollen form the indoor air you breathe.  Control of Mold Allergen Mold and fungi can grow on a variety of surfaces provided certain temperature and moisture conditions exist.  Outdoor molds grow on plants, decaying vegetation and soil.  The major outdoor mold, Alternaria and Cladosporium, are found in very high numbers during hot and dry conditions.  Generally, a late Summer - Fall peak is seen for common outdoor  fungal spores.  Rain will temporarily lower outdoor mold spore count, but counts rise rapidly when the rainy period ends.  The most important indoor molds are Aspergillus and Penicillium.  Dark, humid and poorly ventilated basements are ideal sites for mold growth.  The next most common sites of mold growth are the bathroom and the kitchen.  Outdoor [employer] 8. Use air conditioning and keep windows closed 9. Avoid exposure to decaying vegetation. 10. Avoid leaf raking. 11. Avoid grain handling. 12. Consider wearing a face mask if working in moldy areas.  Indoor Mold Control 1. Maintain humidity below 50%. 2. Clean washable surfaces with 5% bleach solution. 3. Remove sources e.g. Contaminated carpets.  Control of Cockroach Allergen  Cockroach allergen has been identified as an important cause of acute attacks of asthma, especially in urban settings.  There are fifty-five species of cockroach that exist in the [place], however only three, the [place], [place] species produce allergen that can affect patients with Asthma.  Allergens can be obtained from fecal particles, egg casings and secretions from cockroaches.    1. Remove food sources. 2. Reduce access to water. 3. Seal access and entry points. 4. Spray runways with 0.5-1% Diazinon or Chlorpyrifos 5. Blow boric acid power under stoves and refrigerator. 6. Place bait stations (hydramethylnon) at feeding sites.     Control of Dust Mite Allergen Dust mites play a major role in allergic asthma and rhinitis. They occur in environments with high humidity wherever human skin is found. Dust mites absorb  humidity from the atmosphere (ie, they do not drink) and feed on organic matter (including shed human and animal skin). Dust mites are a microscopic type of insect that you cannot see with the naked eye. High levels of dust mites have been detected from mattresses, pillows, carpets, upholstered furniture, bed covers,  clothes, soft toys and any woven material. The principal allergen of the dust mite is found in its feces. A gram of dust may contain 1,000 mites and 250,000 fecal particles. Mite antigen is easily measured in the air during house cleaning activities. Dust mites do not bite and do not cause harm to humans, other than by triggering allergies/asthma.  Ways to decrease your exposure to dust mites in your home:  1. Encase mattresses, box springs and pillows with a mite-impermeable barrier or cover  2. Wash sheets, blankets and drapes weekly in hot water (130 F) with detergent and dry them in a dryer on the hot setting.  3. Have the room cleaned frequently with a vacuum cleaner and a damp dust-mop. For carpeting or rugs, vacuuming with a vacuum cleaner equipped with a high-efficiency particulate air (HEPA) filter. The dust mite allergic individual should not be in a room which is being cleaned and should wait 1 hour after cleaning before going into the room.  4. Do not sleep on upholstered furniture (eg, couches).  5. If possible removing carpeting, upholstered furniture and drapery from the home is ideal. Horizontal blinds should be eliminated in the rooms where the person spends the most time (bedroom, study, television room). Washable vinyl, roller-type shades are optimal.  6. Remove all non-washable stuffed toys from the bedroom. Wash stuffed toys weekly like sheets and blankets above.  7. Reduce indoor humidity to less than 50%. Inexpensive humidity monitors can be purchased at most hardware stores. Do not use a humidifier as can make the problem worse and are not recommended.

## 2019-04-24 NOTE — Progress Notes (Signed)
RE: Joseph Fuentes MRN: [id number] DOB: [birth year] Date of Telemedicine Visit: 04/24/2019  Referring provider: Maury Dus, MD Primary care provider: Maury Dus, MD  Chief Complaint: Allergic Rhinitis    Telemedicine Follow Up Visit via Telephone: I connected with [name] for a follow up on 04/24/19 by telephone and verified that I am speaking with the correct person using two identifiers.   I discussed the limitations, risks, security and privacy concerns of performing an evaluation and management service by telephone and the availability of in person appointments. I also discussed with the patient that there may be a patient responsible charge related to this service. The patient expressed understanding and agreed to proceed.  Patient is at work  Provider is at the office.  Visit start time: 9:30 Visit end time: 9:57 Insurance consent/check in by: [place] consent and medical assistant/nurse: Damita  History of Present Illness: He is a 64 y.o. male, who is being followed for allergic rhinitis and reflux. His previous allergy office visit was on 04/08/2019 with Dr. Neldon Mc. At today's visit, he reports that his symptoms of left sided nasal congestion and dry, scaly, and bloody mucosa have improved over the last 2 weeks by about 70%. He reports that he continues to experience a slight congested feeling in the left nare while lying on his left side which resolves with movement. He denies nasal drainage and reports the mucosal lining of his left nare has improved significantly and is not having any bleeding. He is currently using nasal saline rinses once or twice a day, Bactroban 3 times a day, and has recently started using ipratropium as needed. He is not currently using Flonase, however, he reports poor application technique from a few weeks prior. His current medications are listed in the chart.     Assessment and Plan: Allergic rhinitis Continue avoidance measures  directed toward grass pollen, tree pollen, molds, cockroach, and dust mite Continue daily saline rinses When your nostrils have completely healed, begin Flonase 1-2 sprays in each nostril once a day as needed for a stuffy nose.  In the right nostril, point the applicator out toward the right ear. In the left nostril, point the applicator out toward the left ear Continue ipratropium nasal spray 1 spray in each nostril 1-2 times a day as needed for a runny nose  Reflux Continue omeprazole 40 mg 1-2 times a day  Continue dietary and lifestyle modifications  Call the clinic if this treatment plan is not working well for you  Follow up in 1 year or sooner if needed.  Return in about 1 year (around 04/23/2020), or if symptoms worsen or fail to improve.   Medication List:  Current Outpatient Medications  Medication Sig Dispense Refill  . ALLEGRA-D ALLERGY & CONGESTION 180-240 MG 24 hr tablet Take 1 tablet by mouth daily.   3  . aspirin-acetaminophen-caffeine (EXCEDRIN MIGRAINE) 250-250-65 MG tablet Take by mouth every 6 (six) hours as needed for headache.    . DEXILANT 60 MG capsule Take 60 mg by mouth every other day.    . docusate sodium (COLACE) 100 MG capsule Take 100 mg by mouth daily.     [name] eletriptan (RELPAX) 40 MG tablet One tablet by mouth at onset of headache. May repeat in 2 hours if headache persists or recurs.     [name] HYDROcodone-acetaminophen (NORCO) 10-325 MG tablet Take 1 tablet by mouth as needed.     [name] ibuprofen (ADVIL,MOTRIN) 600 MG tablet Take 600 mg by  mouth as needed.    [name] ipratropium (ATROVENT) 0.06 % nasal spray USE 1 SPRAY NASALLY TWICE  DAILY (Patient taking differently: Place 1 spray into both nostrils daily as needed. ) 45 mL 0  . levothyroxine (SYNTHROID) 300 MCG tablet Take 300 mcg by mouth daily.    . Multiple Vitamins-Minerals (MULTIVITAMIN PO) Take by mouth.    [name] NAFTIN 2 % CREA as needed.    . naproxen sodium (ANAPROX) 550 MG tablet Take 550 mg by mouth as  needed.    [name] NASAL SALINE NA Place into the nose as needed.    [name] omeprazole (PRILOSEC) 40 MG capsule Take 1-2 tablets by mouth at bedtime.     [name] POTASSIUM PO Take 99 mg by mouth.     . tamsulosin (FLOMAX) 0.4 MG CAPS capsule 1 capsule 30 minutes after the same meal each day    . topiramate (TOPAMAX) 200 MG tablet Take 1 tablet (200 mg total) by mouth daily. 90 tablet 3  . triamcinolone ointment (KENALOG) 0.1 % as needed.    Lilian Kapur 625 MG tablet Take 3 tablets by mouth Twice daily.    [name] ALPRAZolam (XANAX) 0.5 MG tablet 0.5-1 tablet by mouth as needed.    . desoximetasone (TOPICORT) 0.25 % cream Apply 1 application topically daily as needed.    . fluticasone (FLONASE) 50 MCG/ACT nasal spray USE 1 TO 2 SPRAYS IN EACH  NOSTRIL ONCE DAILY (Patient not taking: Reported on 04/24/2019) 48 g 0  . Galcanezumab-gnlm (EMGALITY) 120 MG/ML SOAJ Inject 120 mg into the skin every 30 (thirty) days. (Patient not taking: Reported on 11/13/2018) 1 pen 11  . zolpidem (AMBIEN CR) 12.5 MG CR tablet Take 12.5 mg by mouth at bedtime.      No current facility-administered medications for this visit.   Allergies: Allergies  Allergen Reactions  . Penicillins   . Terbinafine And Related    I reviewed his past medical history, social history, family history, and environmental history and no significant changes have been reported from previous visit on 04/08/2019.  Objective: Physical Exam Not obtained as encounter was done via telephone.   Previous notes and tests were reviewed.  I discussed the assessment and treatment plan with the patient. The patient was provided an opportunity to ask questions and all were answered. The patient agreed with the plan and demonstrated an understanding of the instructions.   The patient was advised to call back or seek an in-person evaluation if the symptoms worsen or if the condition fails to improve as anticipated.  I provided 27 minutes of non-face-to-face time during this  encounter.  It was my pleasure to participate in [name] care today. Please feel free to contact me with any questions or concerns.   Sincerely,  Thermon Leyland, FNP   I have provided oversight concerning Thermon Leyland' evaluation and treatment of this patient's health issues addressed during today's encounter. I agree with the assessment and therapeutic plan as outlined in the note.   Thank you for the opportunity to care for this patient.  Please do not hesitate to contact me with questions.  Tonette Bihari, M.D.  [facility] [name] [place], [place] [address] [phone]

## 2019-05-07 ENCOUNTER — Ambulatory Visit: Payer: BC Managed Care – PPO | Attending: Internal Medicine

## 2019-05-07 DIAGNOSIS — Z23 Encounter for immunization: Secondary | ICD-10-CM

## 2019-05-07 NOTE — Progress Notes (Signed)
   Covid-19 Vaccination Clinic  Name:  Andrey Mccaskill    MRN: [id number] DOB: Sep 03, 1955  05/07/2019  Mr. Ellner was observed post Covid-19 immunization for 15 minutes without incident. He was provided with Vaccine Information Sheet and instruction to access the V-Safe system.   Mr. Hoffmann was instructed to call 911 with any severe reactions post vaccine: [name] Difficulty breathing  . Swelling of face and throat  . A fast heartbeat  . A bad rash all over body  . Dizziness and weakness   Immunizations Administered    Name Date Dose VIS Date Route   Pfizer COVID-19 Vaccine 05/07/2019  4:02 PM 0.3 mL 01/10/2019 Intramuscular   Manufacturer: [employer], [employer]   Lot: [id number]   NDC: [address]-5

## 2019-05-13 ENCOUNTER — Ambulatory Visit (INDEPENDENT_AMBULATORY_CARE_PROVIDER_SITE_OTHER): Payer: BC Managed Care – PPO | Admitting: Sports Medicine

## 2019-05-13 ENCOUNTER — Other Ambulatory Visit: Payer: Self-pay

## 2019-05-13 VITALS — BP 124/74 | Ht 72.0 in | Wt 238.0 lb

## 2019-05-13 DIAGNOSIS — M7062 Trochanteric bursitis, left hip: Secondary | ICD-10-CM

## 2019-05-13 DIAGNOSIS — M7061 Trochanteric bursitis, right hip: Secondary | ICD-10-CM | POA: Diagnosis not present

## 2019-05-13 MED ORDER — METHYLPREDNISOLONE ACETATE 40 MG/ML IJ SUSP
40.0000 mg | Freq: Once | INTRAMUSCULAR | Status: AC
  Administered 2019-05-13: 40 mg via INTRA_ARTICULAR

## 2019-05-13 NOTE — Progress Notes (Signed)
   Subjective:    Patient ID: Joseph Fuentes, male    DOB: 1955/06/22, 64 y.o.   MRN: [id number]  HPI chief complaint: Bilateral hip pain  Patient comes in today for follow-up on bilateral greater trochanteric bursitis.  He endorses pain along the lateral aspect of his hips which is worse at night when trying to sleep.  Pain does not radiate.  He had injections 6 mos ago and it helped for 3 months before progressively worsening. His condition is not as bad as it was 6 mos ago but he is getting a new Sleep Number bed next week and would like to have relief now so he knows if the new bed is a good solution.  Review of Systems As above    Objective:   Physical Exam  WDWN, NAD in exam room  Bilateral hips: No ecchymosis, erythema, with mild swelling over greater trochanters TTP over greater trochanters bilaterally FROM Strength 5/5 wit hip extension/flexion/abduction/adduction/int rotation/ext rotation Fadir neg Faber neg Straight leg test neg NV intact    Assessment & Plan:   Bilateral hip pain secondary to greater trochanteric bursitis  Each of his hips were injected today with cortisone.  This was done after risks and benefits were explained.  Patient tolerated this without difficulty.  He understands the importance of continuing with his home exercises.  I did discuss formal physical therapy if symptoms persist.  Follow-up for ongoing or recalcitrant issues.  Consent obtained and verified. Time-out conducted. Noted no overlying erythema, induration, or other signs of local infection. Skin prepped in a sterile fashion. Topical analgesic spray: Ethyl chloride. Joint: right hip greater troch Needle: 25g 1.5 inch Completed without difficulty. Meds: 3cc 1% xylocaine, 1cc (40mg ) depomedrol  Advised to call if fevers/chills, erythema, induration, drainage, or persistent bleeding.  Consent obtained and verified. Time-out conducted. Noted no overlying erythema, induration, or other  signs of local infection. Skin prepped in a sterile fashion. Topical analgesic spray: Ethyl chloride. Joint: left hip greater troch Needle: 25g 1.5 inch Completed without difficulty. Meds: 3cc 1% xylocaine, 1cc (40mg ) depomedrol  Advised to call if fevers/chills, erythema, induration, drainage, or persistent bleeding.  Patient seen and evaluated with the medical student.  I agree with the above plan of care.  Bilateral greater trochanteric bursa were injected today with cortisone.  Follow-up as needed.

## 2019-05-26 ENCOUNTER — Other Ambulatory Visit: Payer: Self-pay | Admitting: Allergy and Immunology

## 2019-05-29 ENCOUNTER — Other Ambulatory Visit: Payer: Self-pay | Admitting: Allergy and Immunology

## 2019-06-03 DIAGNOSIS — B351 Tinea unguium: Secondary | ICD-10-CM | POA: Diagnosis not present

## 2019-06-03 DIAGNOSIS — Z0000 Encounter for general adult medical examination without abnormal findings: Secondary | ICD-10-CM | POA: Diagnosis not present

## 2019-06-03 DIAGNOSIS — G43909 Migraine, unspecified, not intractable, without status migrainosus: Secondary | ICD-10-CM | POA: Diagnosis not present

## 2019-06-03 DIAGNOSIS — E782 Mixed hyperlipidemia: Secondary | ICD-10-CM | POA: Diagnosis not present

## 2019-06-03 DIAGNOSIS — Z125 Encounter for screening for malignant neoplasm of prostate: Secondary | ICD-10-CM | POA: Diagnosis not present

## 2019-06-03 DIAGNOSIS — E039 Hypothyroidism, unspecified: Secondary | ICD-10-CM | POA: Diagnosis not present

## 2019-06-03 DIAGNOSIS — G4700 Insomnia, unspecified: Secondary | ICD-10-CM | POA: Diagnosis not present

## 2019-06-17 ENCOUNTER — Other Ambulatory Visit: Payer: Self-pay

## 2019-06-17 ENCOUNTER — Ambulatory Visit (INDEPENDENT_AMBULATORY_CARE_PROVIDER_SITE_OTHER): Payer: BC Managed Care – PPO | Admitting: Sports Medicine

## 2019-06-17 VITALS — BP 130/74 | Ht 72.0 in | Wt 229.0 lb

## 2019-06-17 DIAGNOSIS — M1712 Unilateral primary osteoarthritis, left knee: Secondary | ICD-10-CM | POA: Diagnosis not present

## 2019-06-17 MED ORDER — METHYLPREDNISOLONE ACETATE 40 MG/ML IJ SUSP
40.0000 mg | Freq: Once | INTRAMUSCULAR | Status: AC
  Administered 2019-06-17: 40 mg via INTRA_ARTICULAR

## 2019-06-18 NOTE — Progress Notes (Signed)
   Subjective:    Patient ID: Joseph Fuentes, male    DOB: [birth year], 64 y.o.   MRN: [id number]  HPI chief complaint: Bilateral knee pain  Taheem presents today complaining of bilateral knee pain, left greater than right.  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  An MRI of the left knee in 2020 showed high-grade partial-thickness cartilage loss of the medial compartment as well as partial-thickness cartilage loss of the patellofemoral compartment.  These responded well in the past cortisone injections.  He is requesting a repeat cortisone injection into the left knee.  Although his right knee is somewhat bothersome it is not severe enough that he wants a cortisone injection at this time.  He localizes his pain primarily to the anterior knee.  He is not noticed any swelling.  Denies mechanical symptoms.  Denies any recent trauma.  He is an active golfer and does wear his compression sleeve when playing.    Review of Systems    As above Objective:   Physical Exam  Well-developed, well-nourished.  No acute distress.  Awake alert and oriented x3.  Vital signs reviewed  Left knee: Full range of motion.  No obvious effusion.  There is some slight tenderness to palpation along the medial joint line but negative McMurray's.  Trace to 1+ patellofemoral crepitus.  Knee is grossly stable ligamentous exam.  Neurovascularly intact distally.      Assessment & Plan:   Returning left knee pain secondary to DJD  Patient's left knee is injected with cortisone today.  An anterior medial approach was utilized.  Patient tolerates this without difficulty.  He takes both naproxen and ibuprofen as needed and finds them to be helpful.  He can continue with these  but understands that these medicines may cause stomach upset.  Of note, his PCP recently checked his renal function and found it to be normal.  Continue with activity as tolerated and follow-up as needed.  Consent obtained and verified. Time-out  conducted. Noted no overlying erythema, induration, or other signs of local infection. Skin prepped in a sterile fashion. Topical analgesic spray: Ethyl chloride. Joint: left knee Needle: 25g 1.5 inch Completed without difficulty. Meds: 3cc 1% xylocaine, 1cc (40mg ) depomedrol  Advised to call if fevers/chills, erythema, induration, drainage, or persistent bleeding.

## 2019-06-20 DIAGNOSIS — I889 Nonspecific lymphadenitis, unspecified: Secondary | ICD-10-CM | POA: Diagnosis not present

## 2019-06-23 DIAGNOSIS — G4733 Obstructive sleep apnea (adult) (pediatric): Secondary | ICD-10-CM | POA: Diagnosis not present

## 2019-07-21 ENCOUNTER — Other Ambulatory Visit: Payer: Self-pay

## 2019-07-21 DIAGNOSIS — M545 Low back pain, unspecified: Secondary | ICD-10-CM

## 2019-07-21 DIAGNOSIS — G8929 Other chronic pain: Secondary | ICD-10-CM

## 2019-07-21 NOTE — Progress Notes (Signed)
Pt is asking for an order put in epic for another ESI for back pain at [facility].

## 2019-07-22 ENCOUNTER — Other Ambulatory Visit: Payer: Self-pay | Admitting: Sports Medicine

## 2019-07-22 DIAGNOSIS — G8929 Other chronic pain: Secondary | ICD-10-CM

## 2019-07-22 DIAGNOSIS — M545 Low back pain, unspecified: Secondary | ICD-10-CM

## 2019-07-24 DIAGNOSIS — N401 Enlarged prostate with lower urinary tract symptoms: Secondary | ICD-10-CM | POA: Diagnosis not present

## 2019-07-24 DIAGNOSIS — R350 Frequency of micturition: Secondary | ICD-10-CM | POA: Diagnosis not present

## 2019-07-24 DIAGNOSIS — R351 Nocturia: Secondary | ICD-10-CM | POA: Diagnosis not present

## 2019-08-01 ENCOUNTER — Other Ambulatory Visit: Payer: Self-pay | Admitting: Sports Medicine

## 2019-08-01 ENCOUNTER — Ambulatory Visit
Admission: RE | Admit: 2019-08-01 | Discharge: 2019-08-01 | Disposition: A | Discharge status: Home / Self Care | Payer: BC Managed Care – PPO | Source: Ambulatory Visit | Attending: Sports Medicine | Admitting: Sports Medicine

## 2019-08-01 DIAGNOSIS — M545 Low back pain, unspecified: Secondary | ICD-10-CM

## 2019-08-01 DIAGNOSIS — G8929 Other chronic pain: Secondary | ICD-10-CM

## 2019-08-01 MED ORDER — IOPAMIDOL (ISOVUE-M 200) INJECTION 41%
1.0000 mL | Freq: Once | INTRAMUSCULAR | Status: AC
  Administered 2019-08-01: 1 mL via EPIDURAL

## 2019-08-01 MED ORDER — METHYLPREDNISOLONE ACETATE 40 MG/ML INJ SUSP (RADIOLOG
120.0000 mg | Freq: Once | INTRAMUSCULAR | Status: AC
  Administered 2019-08-01: 120 mg via EPIDURAL

## 2019-08-01 MED ORDER — DIAZEPAM 5 MG PO TABS
5.0000 mg | ORAL_TABLET | Freq: Once | ORAL | Status: AC
  Administered 2019-08-01: 5 mg via ORAL

## 2019-08-01 NOTE — Discharge Instructions (Signed)

## 2019-08-05 DIAGNOSIS — G4733 Obstructive sleep apnea (adult) (pediatric): Secondary | ICD-10-CM | POA: Diagnosis not present

## 2019-09-02 DIAGNOSIS — E039 Hypothyroidism, unspecified: Secondary | ICD-10-CM | POA: Diagnosis not present

## 2019-09-02 DIAGNOSIS — R42 Dizziness and giddiness: Secondary | ICD-10-CM | POA: Diagnosis not present

## 2019-09-04 DIAGNOSIS — G4733 Obstructive sleep apnea (adult) (pediatric): Secondary | ICD-10-CM | POA: Diagnosis not present

## 2019-09-07 ENCOUNTER — Other Ambulatory Visit: Payer: Self-pay | Admitting: Allergy and Immunology

## 2019-10-07 ENCOUNTER — Other Ambulatory Visit: Payer: Self-pay

## 2019-10-07 ENCOUNTER — Other Ambulatory Visit: Payer: Self-pay | Admitting: Sports Medicine

## 2019-10-07 DIAGNOSIS — M545 Low back pain, unspecified: Secondary | ICD-10-CM

## 2019-10-07 DIAGNOSIS — G8929 Other chronic pain: Secondary | ICD-10-CM

## 2019-10-07 DIAGNOSIS — G4733 Obstructive sleep apnea (adult) (pediatric): Secondary | ICD-10-CM | POA: Diagnosis not present

## 2019-10-07 NOTE — Progress Notes (Signed)
Pt is asking to get an ESI before he travels tomorrow afternoon.  Order placed, pt informed that it's unlikely he will get an appt that quickly but he will get a call to schedule the appt.

## 2019-10-13 DIAGNOSIS — K219 Gastro-esophageal reflux disease without esophagitis: Secondary | ICD-10-CM | POA: Diagnosis not present

## 2019-10-14 ENCOUNTER — Ambulatory Visit
Admission: RE | Admit: 2019-10-14 | Discharge: 2019-10-14 | Disposition: A | Discharge status: Home / Self Care | Payer: BC Managed Care – PPO | Source: Ambulatory Visit | Attending: Sports Medicine | Admitting: Sports Medicine

## 2019-10-14 ENCOUNTER — Other Ambulatory Visit: Payer: Self-pay

## 2019-10-14 DIAGNOSIS — M47817 Spondylosis without myelopathy or radiculopathy, lumbosacral region: Secondary | ICD-10-CM | POA: Diagnosis not present

## 2019-10-14 DIAGNOSIS — M545 Low back pain, unspecified: Secondary | ICD-10-CM

## 2019-10-14 DIAGNOSIS — G8929 Other chronic pain: Secondary | ICD-10-CM

## 2019-10-14 MED ORDER — IOPAMIDOL (ISOVUE-M 200) INJECTION 41%
1.0000 mL | Freq: Once | INTRAMUSCULAR | Status: AC
  Administered 2019-10-14: 1 mL via EPIDURAL

## 2019-10-14 MED ORDER — METHYLPREDNISOLONE ACETATE 40 MG/ML INJ SUSP (RADIOLOG: 120.0000 mg | Freq: Once | INTRAMUSCULAR | Status: DC

## 2019-10-14 MED ORDER — DIAZEPAM 5 MG PO TABS
5.0000 mg | ORAL_TABLET | Freq: Once | ORAL | Status: AC
  Administered 2019-10-14: 5 mg via ORAL

## 2019-10-14 NOTE — Discharge Instructions (Signed)

## 2019-10-21 DIAGNOSIS — Z1159 Encounter for screening for other viral diseases: Secondary | ICD-10-CM | POA: Diagnosis not present

## 2019-10-24 DIAGNOSIS — K219 Gastro-esophageal reflux disease without esophagitis: Secondary | ICD-10-CM | POA: Diagnosis not present

## 2019-10-24 DIAGNOSIS — K317 Polyp of stomach and duodenum: Secondary | ICD-10-CM | POA: Diagnosis not present

## 2019-10-24 DIAGNOSIS — K449 Diaphragmatic hernia without obstruction or gangrene: Secondary | ICD-10-CM | POA: Diagnosis not present

## 2019-10-28 ENCOUNTER — Other Ambulatory Visit: Payer: Self-pay

## 2019-10-28 ENCOUNTER — Ambulatory Visit (INDEPENDENT_AMBULATORY_CARE_PROVIDER_SITE_OTHER): Payer: BC Managed Care – PPO | Admitting: Sports Medicine

## 2019-10-28 VITALS — BP 126/82 | Ht 72.0 in | Wt 230.0 lb

## 2019-10-28 DIAGNOSIS — M1712 Unilateral primary osteoarthritis, left knee: Secondary | ICD-10-CM

## 2019-10-28 MED ORDER — METHYLPREDNISOLONE ACETATE 40 MG/ML IJ SUSP
40.0000 mg | Freq: Once | INTRAMUSCULAR | Status: AC
  Administered 2019-10-28: 40 mg via INTRA_ARTICULAR

## 2019-10-29 ENCOUNTER — Encounter: Payer: Self-pay | Admitting: Sports Medicine

## 2019-10-29 NOTE — Progress Notes (Signed)
   Subjective:    Patient ID: Joseph Fuentes, male    DOB: 1955/07/10, 64 y.o.   MRN: [id number]  HPI chief complaint: Left knee pain  Corbitt comes in today with returning left knee pain.  He has a documented history of left knee DJD.  An MRI done in February 2020 showed high-grade partial-thickness cartilage loss of the medial compartment and partial-thickness cartilage loss of the patellofemoral joint.  He has had cortisone injections in the past with limited benefit.  We had previously discussed a referral to orthopedics to discuss further treatment but the patient decided to stop bowling for a while and noticed that his knee pain improved dramatically.  He has recently returned to bowling and his knee pain has returned.  No new injury.  It is diffuse throughout the knee.  He has not noticed any swelling.  Ice and TENS unit both seem to help.  He is asking about the possibility of a repeat cortisone injection today.  He is also status post lumbar ESI on September 14.  He is doing well.  He enjoys golfing and states that his back will hurt worse with golf.  He has an upcoming golf trip in a couple of weeks.    Review of Systems As above    Objective:   Physical Exam  Well-developed, well-nourished.  No acute distress  Left knee: Full range of motion.  There is no obvious effusion.  Slight tenderness to palpation along the medial joint line.  Negative McMurray's.  Good joint stability.  Neurovascularly intact distally.  MRI of the left knee from 2020 as above      Assessment & Plan:   Returning left knee pain secondary to DJD Stable lumbar degenerative disc disease status post ESI  Patient's left knee is injected today with cortisone.  An anterior medial approach is utilized.  He tolerates this without difficulty.  He has a compression sleeve to wear with activity.  Continue with ice and TENS unit as needed.  If he continues to struggle then we will reconsider referral to orthopedics.   Follow-up for ongoing or recalcitrant issues.  Consent obtained and verified. Time-out conducted. Noted no overlying erythema, induration, or other signs of local infection. Skin prepped in a sterile fashion. Topical analgesic spray: Ethyl chloride. Joint: left knee Needle: 25g 1.5 inch Completed without difficulty. Meds: 3cc 1% xylocaine, 1cc (40mg ) depomedrol  Advised to call if fevers/chills, erythema, induration, drainage, or persistent bleeding.

## 2019-11-06 DIAGNOSIS — G4733 Obstructive sleep apnea (adult) (pediatric): Secondary | ICD-10-CM | POA: Diagnosis not present

## 2019-11-17 IMAGING — MR MR LUMBAR SPINE W/O CM
4 of 5 series · 23 of 48 positions shown · non-contrast
Comparison: Radiography 08/27/2017

CLINICAL DATA: Chronic right-sided low back pain and leg pain over
the last 8 months.

EXAM:
MRI LUMBAR SPINE WITHOUT CONTRAST
TECHNIQUE: Multiplanar, multisequence MR imaging of the lumbar spine was
performed. No intravenous contrast was administered.

[Series 2: T2 · sagittal · 4.0mm · 0.44mm/px · 6 of 12 slices shown (1 of 2)]
[im 1/12]
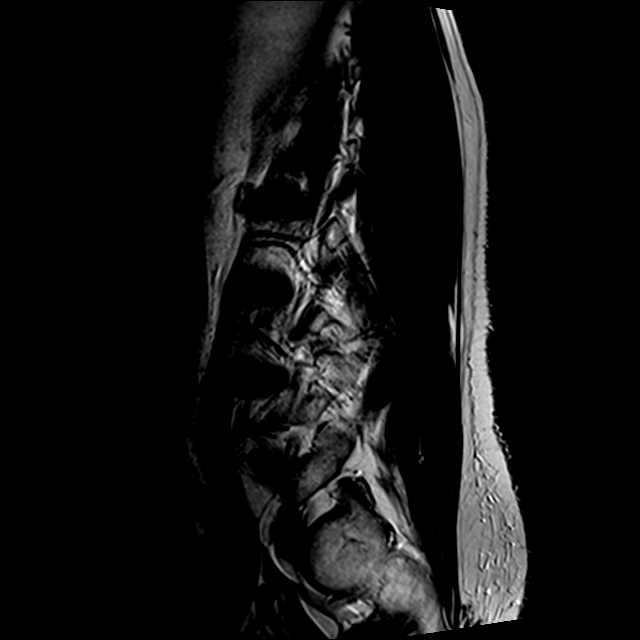
[im 3/12]
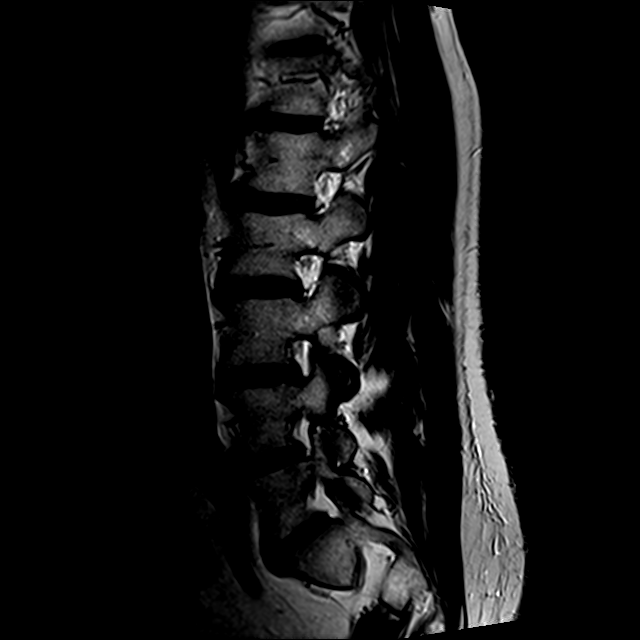
[im 5/12]
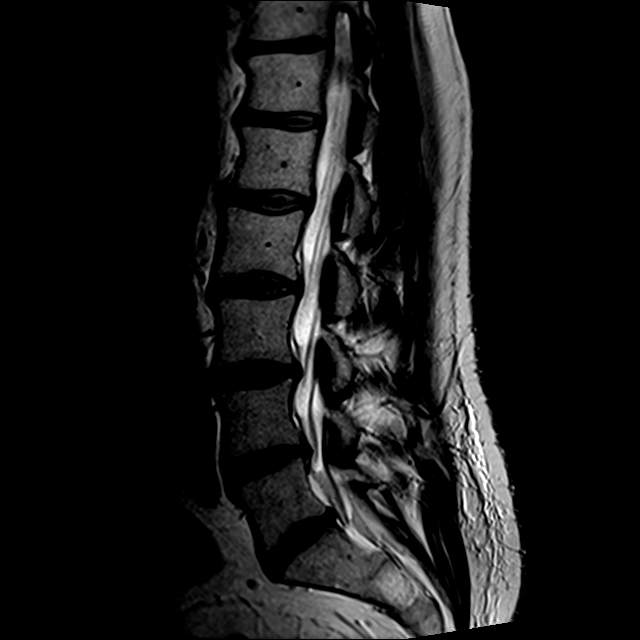
[im 7/12]
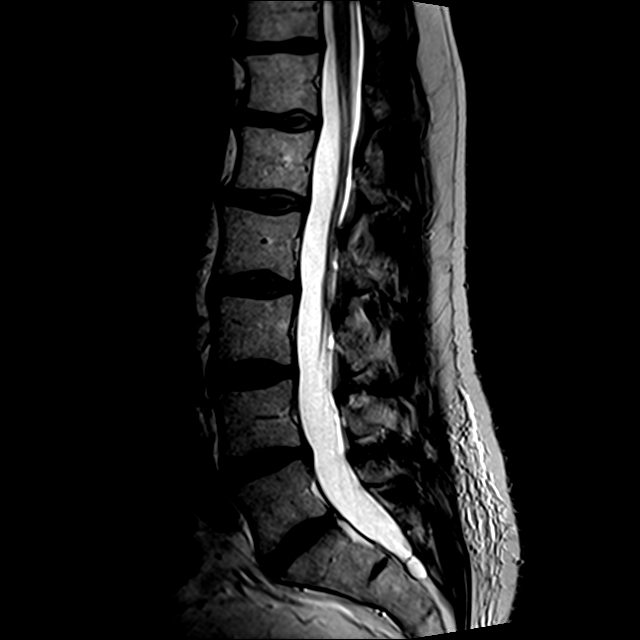
[im 9/12]
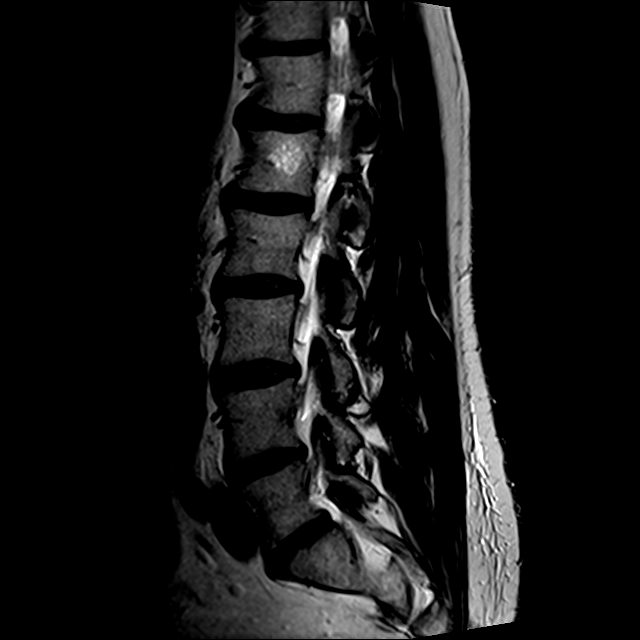
[im 12/12]
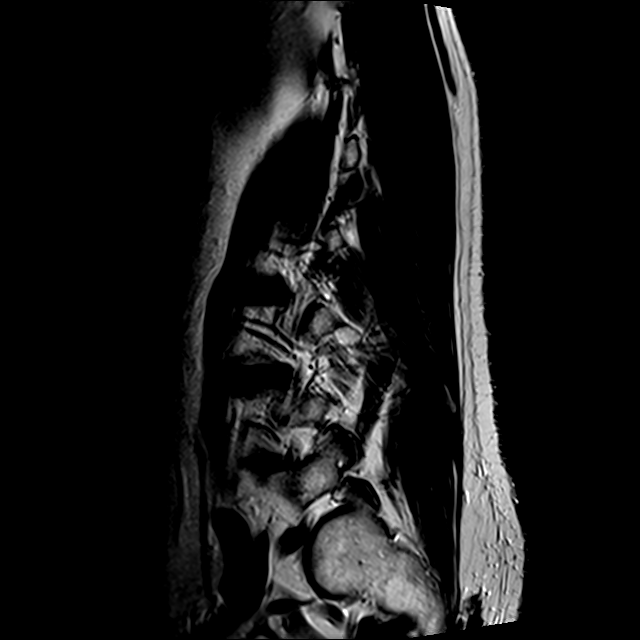

[Series 3: T1 · sagittal · 4.0mm · 0.55mm/px · 4 of 12 slices shown (1 of 2)]
[im 1/12]
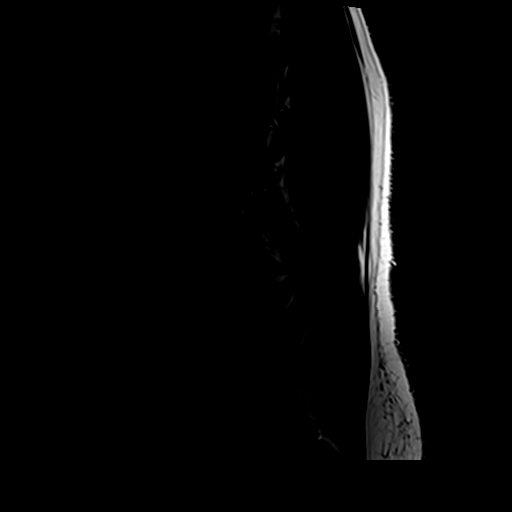
[im 3/12]
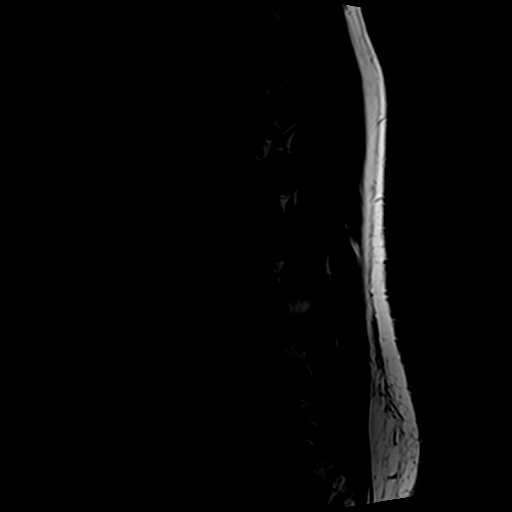
[im 6/12]
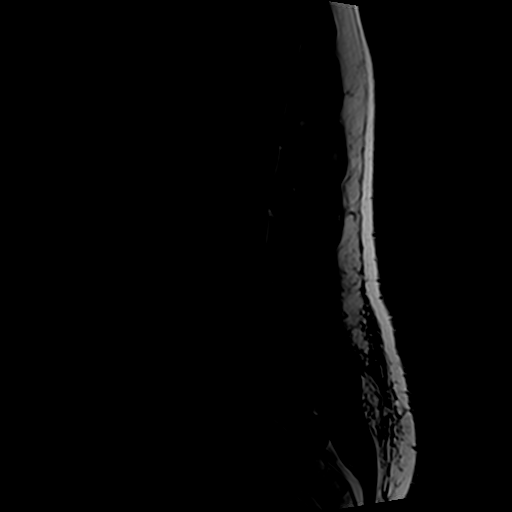
[im 12/12]
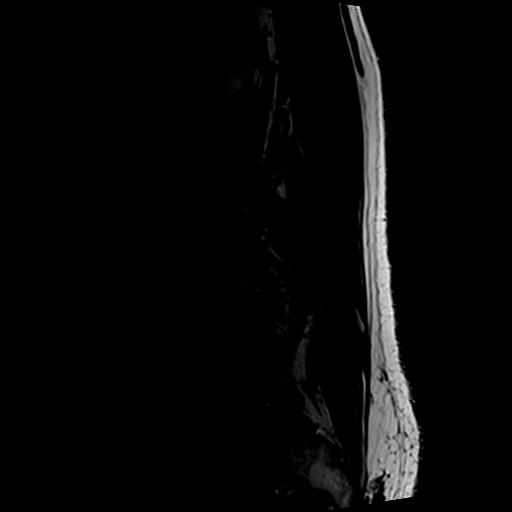

[Series 5: T1 · axial · 4.0mm · 0.39mm/px · z∈[-108,+63]mm · 3 of 39 slices shown (2 of 2)]
[im 6/39]
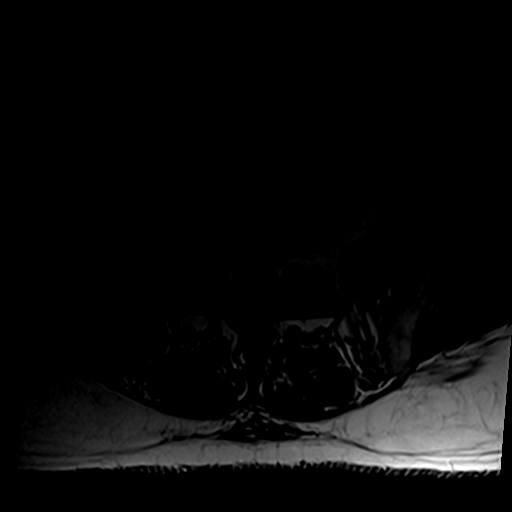
[im 21/39]
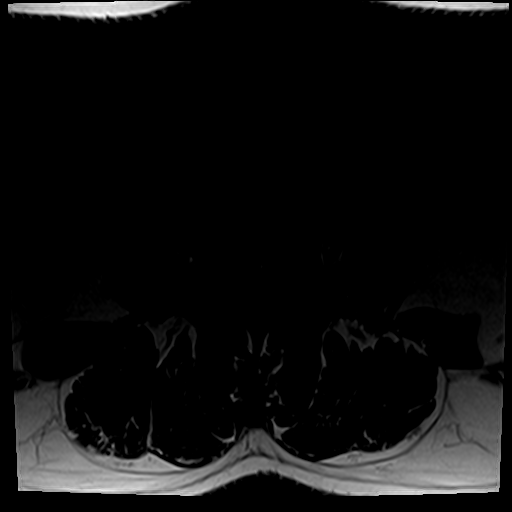
[im 33/39]
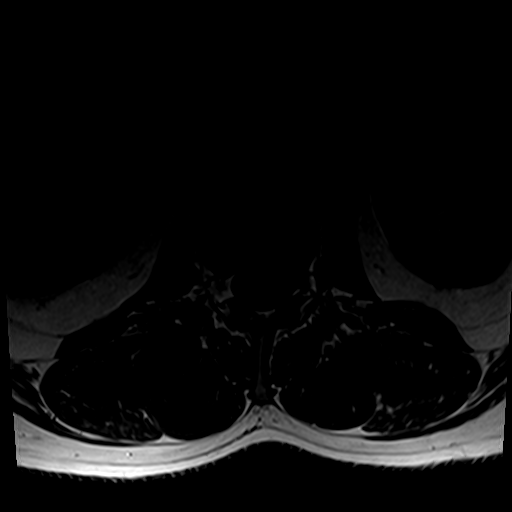

[Series 6: T2 · axial · 4.0mm · 0.78mm/px · z∈[-123,+124]mm · 10 of 39 slices shown (2 of 2)]
[im 3/39]
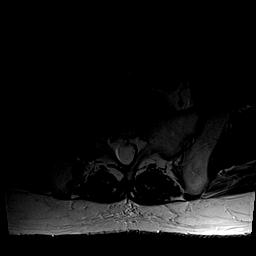
[im 6/39]
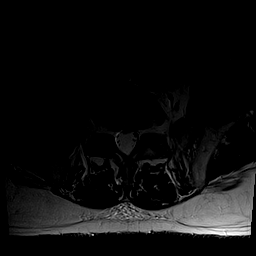
[im 8/39]
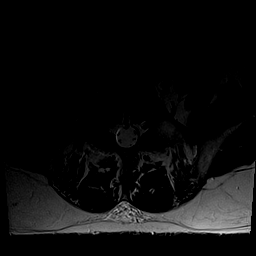
[im 13/39]
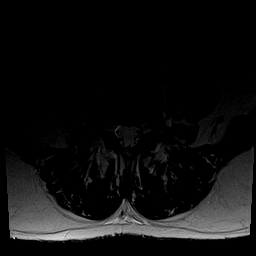
[im 18/39]
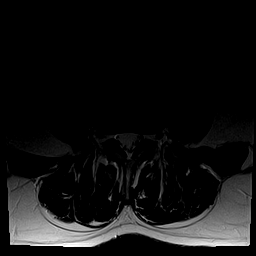
[im 21/39]
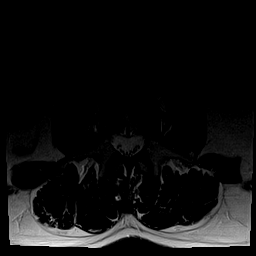
[im 23/39]
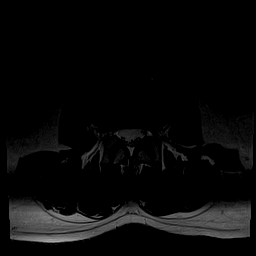
[im 28/39]
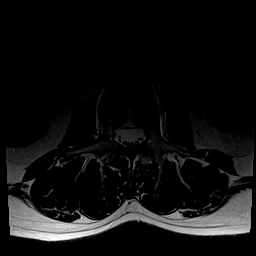
[im 33/39]
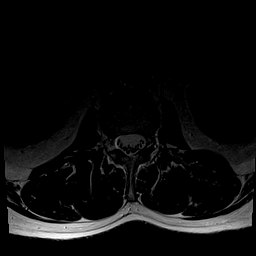
[im 39/39]
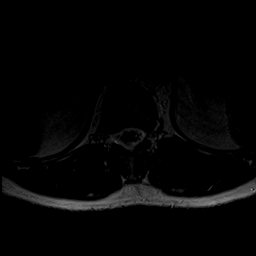

[23 of 48 positions shown; findings below may reference images not displayed]

FINDINGS: Segmentation:  5 lumbar type vertebral bodies.

Alignment:  Normal

Vertebrae:  No fracture or primary bone lesion.

Conus medullaris and cauda equina: Conus extends to the T12 level.
Conus and cauda equina appear normal.

Paraspinal and other soft tissues: Negative

Disc levels:

Minimal, non-compressive disc bulges at L1-2 and L2-3.

L3-4: Bulging of the disc. This is slightly asymmetrically prominent
in the right foraminal region, where there would be some potential
to affect the right L3 nerve.

L4-5: Mild bulging of the disc. No compressive canal or foraminal
stenosis. Mild facet osteoarthritis.

L5-S1: Mild bulging of the disc with a central annular fissure. No
stenosis or neural compression. No facet arthropathy.
IMPRESSION: Mild bulging of the discs as outlined above. No large herniation. At
L3-4, there is a slightly asymmetric disc bulge prominence in the
right foraminal region that would have some potential to affect the
right L3 nerve.

Patient also has mild facet osteoarthritis at L4-5 which could
contribute to low back pain.

## 2019-12-04 DIAGNOSIS — E782 Mixed hyperlipidemia: Secondary | ICD-10-CM | POA: Diagnosis not present

## 2019-12-04 DIAGNOSIS — G4700 Insomnia, unspecified: Secondary | ICD-10-CM | POA: Diagnosis not present

## 2019-12-04 DIAGNOSIS — G43909 Migraine, unspecified, not intractable, without status migrainosus: Secondary | ICD-10-CM | POA: Diagnosis not present

## 2019-12-04 DIAGNOSIS — E039 Hypothyroidism, unspecified: Secondary | ICD-10-CM | POA: Diagnosis not present

## 2019-12-15 ENCOUNTER — Telehealth: Payer: Self-pay | Admitting: Allergy and Immunology

## 2019-12-15 MED ORDER — IPRATROPIUM BROMIDE 0.06 % NA SOLN: NASAL | 0 refills | Status: DC

## 2019-12-15 NOTE — Telephone Encounter (Signed)
Patient called and needs a refill on ipratropium nasey spray [facility]. [phone]

## 2019-12-15 NOTE — Telephone Encounter (Signed)
Ipratropium nasal spray sent into [facility]. Patient is scheduled to see Dr. Lucie Leather on 03/23/2020 at 3:45 PM

## 2019-12-19 DIAGNOSIS — G4733 Obstructive sleep apnea (adult) (pediatric): Secondary | ICD-10-CM | POA: Diagnosis not present

## 2019-12-23 ENCOUNTER — Ambulatory Visit
Admission: RE | Admit: 2019-12-23 | Discharge: 2019-12-23 | Disposition: A | Discharge status: Home / Self Care | Payer: BC Managed Care – PPO | Source: Ambulatory Visit | Attending: Sports Medicine | Admitting: Sports Medicine

## 2019-12-23 ENCOUNTER — Ambulatory Visit (INDEPENDENT_AMBULATORY_CARE_PROVIDER_SITE_OTHER): Payer: BC Managed Care – PPO | Admitting: Sports Medicine

## 2019-12-23 ENCOUNTER — Other Ambulatory Visit: Payer: Self-pay

## 2019-12-23 VITALS — BP 138/84 | Ht 72.0 in | Wt 232.0 lb

## 2019-12-23 DIAGNOSIS — M545 Low back pain, unspecified: Secondary | ICD-10-CM

## 2019-12-23 DIAGNOSIS — G8929 Other chronic pain: Secondary | ICD-10-CM

## 2019-12-23 NOTE — Progress Notes (Signed)
   Subjective:    Patient ID: Joseph Fuentes, male    DOB: 06-24-1955, 64 y.o.   MRN: [id number]  HPI chief complaint: Low back pain  Joseph Fuentes comes in today complaining of 2 to 3 months of intermittent left-sided low back pain.  He has a history significant for a bulging lumbar disc for which he receives periodic epidural steroid injections.  Last ESI was done back in September.  Each ESI has helped tremendously.  His current pain is in a different location however.  It is primarily along the left side of his low back.  He denies any radiating pain into the leg.  He will take an occasional hydrocodone and Advil which will usually help but about a week ago his pain was pretty severe.  Today he is feeling pretty good.  No new injury.  He is here today with his wife.  Interim medical history reviewed Medication reviewed Allergies reviewed    Review of Systems    As above Objective:   Physical Exam  Well-developed, well-nourished.  No acute distress  Lumbar spine: Good lumbar range of motion.  No tenderness to palpation along the lumbar midline.  There is diffuse spasm of the left paraspinal musculature.  No tenderness to palpation.  No tenderness over the SI joint.  Neurovascularly intact distally.      Assessment & Plan:   Left-sided low back pain-question facet mediated pain  He is not currently exhibiting any signs of radiculopathy.  His pain may be facet mediated.  I discussed the possibility of a diagnostic/therapeutic facet injection and he is interested in this.  Therefore, we will order preprocedural x-rays and MRI.  In the meantime, patient will start formal physical therapy with Ellamae Sia and we will schedule a follow-up appointment with me in 4 weeks.

## 2020-01-10 ENCOUNTER — Other Ambulatory Visit: Payer: Self-pay

## 2020-01-10 ENCOUNTER — Ambulatory Visit
Admission: RE | Admit: 2020-01-10 | Discharge: 2020-01-10 | Disposition: A | Discharge status: Home / Self Care | Payer: BC Managed Care – PPO | Source: Ambulatory Visit | Attending: Sports Medicine | Admitting: Sports Medicine

## 2020-01-10 DIAGNOSIS — G8929 Other chronic pain: Secondary | ICD-10-CM

## 2020-01-10 DIAGNOSIS — M545 Low back pain, unspecified: Secondary | ICD-10-CM

## 2020-01-14 ENCOUNTER — Telehealth: Payer: Self-pay | Admitting: Sports Medicine

## 2020-01-14 NOTE — Telephone Encounter (Signed)
  I spoke with Joseph Fuentes on the phone today after reviewing MRI findings of his lumbar spine.  He has mild multilevel spondylosis.  Mild facet changes at multiple levels.  He also has a right foraminal disc protrusion at L3-L4 but all of his pain is on the left side of his back.  No radiculopathy.  He is about 75% better since he has not been playing golf recently.  He knows that golf is what aggravates his pain.  He has difficulty when playing 2-3 times a week but he has been playing a lot less golf over the past month or so.  He has his first physical therapy appointment with Ellamae Sia later this week and I encouraged him to go ahead and start with that.  I have advised against facet injections at this time since his pain is improving.  Obviously we can reconsider that at a later date if his pain returns, especially as he increases his golfing again in the spring.  He has a follow-up appointment with me at the beginning of January.

## 2020-01-17 DIAGNOSIS — M545 Low back pain, unspecified: Secondary | ICD-10-CM | POA: Diagnosis not present

## 2020-01-19 DIAGNOSIS — G4733 Obstructive sleep apnea (adult) (pediatric): Secondary | ICD-10-CM | POA: Diagnosis not present

## 2020-01-26 ENCOUNTER — Telehealth: Payer: Self-pay | Admitting: Neurology

## 2020-01-26 NOTE — Telephone Encounter (Signed)
Joseph Fuentes, call back and ask what he wants to be seen for. If I have seen him in the past for it, he can be seen by an NP. If it is a new problem he needs to see his primary care first and get a new referral. Look at my prior notes I have seen him for migraines and neuropathy in the feet and maybe a few other items. Let me knoq I fyou have any questions thanks.  thanks

## 2020-01-26 NOTE — Telephone Encounter (Signed)
Pt's wife called wanting the RN to call her back to discuss the pt being seen sooner that scheduled appt. Please advise.

## 2020-01-27 NOTE — Telephone Encounter (Signed)
Can you check Aundra Millet or Sarah's schedule?

## 2020-01-27 NOTE — Telephone Encounter (Signed)
Joseph Fuentes, you are seeing one of my patients on the 4th. I will be back in the office on the 3rd, come discuss a plan with me before you see him thanks!

## 2020-01-27 NOTE — Telephone Encounter (Signed)
Maralyn Sago had an opening on 02/03/19 at 3:45pm. The patient accepted this appt. He will check-in at 3:15pm.

## 2020-01-27 NOTE — Telephone Encounter (Signed)
I returned the call to his wife. Reports the patient has been experiencing 1-2 migraines each day for at least the last month. He has been using a combination of eletriptan and OTC NSAIDS on a daily basis. I discussed the consequences of daily medication causing a rebound headache cycle. She said he tried going four days without any medication but continued having headaches. He feels the medications at least knock the pain down some, although temporary. She confirmed that he has continued taking topiramate 200mg  daily.   He has a pending appt on 02/16/20. She would like for him to be seen sooner.

## 2020-02-02 NOTE — Progress Notes (Addendum)
PATIENT: Joseph Fuentes DOB: 1955/08/10  REASON FOR VISIT: follow up HISTORY FROM: patient  HISTORY OF PRESENT ILLNESS: Today 02/03/20  Joseph Fuentes is a very pleasant 65 year old male with long history of migraine headaches.  Previously, on Topamax 200 mg daily, he reported memory loss.  In the past, he started Emgality, for 3 months, weaned dose of Topamax, Emgality was not helpful, ultimately Topamax was increased back to 200 mg twice a day.  He was going to try Aimovig, never did.  For quite a while, his headaches have been fairly well controlled.  However, in the past 2 to 3 months, has had increase in frequency of headache, almost daily.  He got into a cycle of rebound headache, taking frequent Relpax, ibuprofen, Excedrin.  Migraines, having typical presentation, starts at the right cheek, radiates to the right temple, can be associated with right eye drooping, with migraine features.  The only change, is the last several months, he may have 2-3 migraines in 1 day.  As he has started to break away from the rebound cycle, headaches have improved.  He has not had a migraine in the last 2 days.  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 Currently remains on Topamax 200 mg daily.  Takes hydrocodone for chronic back pain.  Presents today for evaluation accompanied by his wife. Headaches have been so bad, he had to walk off the golf course. Not interested in Botox.   HISTORY  Patient is here as a new request from Dr. Renato Gails for new symptoms including numbness and pain in feet for years.He has a hx of uncontrolled migraines, morbid obesity, OSA on cpap, hypothyroidism, insomnia, peripheral neuropathy.  Patient was seen here in the past for migraines, he reduced Topamax due to concern for memory loss, he was started on Emgality which did not help and he was switched to Aimovig at last appointment and finally back to topamax and doing well.  He was referred  in  the past for formal neurocognitive testing  and 1 of the neuropsychologist here in town declined the referral.   MRI Brain in November 2019 was largely unremarkable, normal for age.  His wife is here and provides much information. He has had feet feeling cold and had a test for circulation which was good. He wears socks when he goes to bed. Not cold to the touch but feels cold. Started 2 years ago. No inciting events. He is having new sharp pain shppting down his leg searing to his feet lasting 20-30 seconds, when sleeping, coming from his hips. He can't sleep on his back or stomach due to gerd so he sleeps on his sides and he feels pain in the lower legs on the top of the foot possibly coming from the hips however unclear but the hips do hurt. Hips will hurt when he walks long distance. He had shots in the hips and helped with the hips pain. He plays golf and his hips are ok its just when he lays down on his sides after playing golf.   Interval history 11/21/2017: New referral from Dr. [name]. He has a hx of uncontrolled migraines, morbid obesity, OSA on cpap, hypothyroidism, insomnia, peripheral neuropathy.[name] He is on Topamax 200mg  daily. Also on xanax, ambien. Here with his wife who also provides information. The biggest thing wife has noticed is that  he will drive down the road and then forgets where he is going for 4-5  seconds, panics. He is having trouble remembering how to do his job that he has been doing for 30 years. He doesn't remember directions which is something he has always been very good at. He went to [place] and after he got there he didn't feel comfortable in a congested area. He sometimes doesn't know where he is even in familiar places in the car.  HE pays the bills and manages the finances. Dad is in memory care with severe dementia possibly alzheimers, started displaying in early 36s. Maternal grandmother alzheimers in early 83s. He is compliant but hasn't had it checked for  years, he is very tired and never feels refreshed. He will bring in his chip so we can download only getting 5 hours sleep. No other focal neurologic deficits, associated symptoms, inciting events or modifiable factors.  Previous visit:  Joseph Fuentes is a 65 y.o. male here as a referral from Dr. [name] for migraines. PMHx migraines, HLD, IBS, hypothyroidism, insomnia, peripheral neuropathy. He has had migraines all his life. He started having migraines at the age of 5 or 6, a celiac diet helped when he was young. As he got older he didn't adhere to the diet. His baseline was 3-4 a month with exacerbations of 3 a week. After losing weight 30 pounds he didn't have many headaches during that timeframe. The last 6 months his migraines have been worse, no inciting events except when his diet changed. Migraines start in the right cheek and radiates to the back of the right eye and temple and whole side of the right face. Relpax helps if he catches it. 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 Ice pack helps. No auras or flashing lights. No prodrome. They can occur in the mid morning or later in the evening. He has insomnia at least one night a week. He takes [place] every night and still some nights.he has headaches every day and most are migainous. He takes excedrin 6x a month, relpax 15x a month and ibuprofen 10x a month. No other focal neurologic deficits, associated symptoms, inciting events or modifiable factors.  Migraine medications tried: Topiramate, relpax  Reviewed notes, labs and imaging from outside physicians, which showed:  Reviewed primary care notes: He asked for 180 pills of Relpax, insurance only willing to dispense 36 tabs. Plan usually does not cover more than 12 tabs per month and will have to pay out of ocket for anything over that. Then patient said that he doesn't really need 180 Relpax every 90 days  however after going for urine half without significant migraines his headaches have returned and now he's backed needing at least 72 tablets every 90 days. 72 tablets of Relpax were sent to the patient's pharmacy. He is on topiramate 150 mg at night, also Xanax, ibuprofen 800 mg 3 times a day, Excedrin extra strength when necessary per med list. He is maintained on Topamax and back in July 2017 he was saying he was having 3-4 migraines per month. He uses Relpax with good response. He also has insomnia and currently taking Ambien. I reviewed exam which included normal general, head, eyes, ears, nose, throat, neck, chest, lungs, heart, extremities, skin.   CMP normal 03/24/2015 BUN 22 and creatinine 1.19, CBC 02/15/2015 normal  CT maxillofacial 04/23/2008 personally reviewed report:  Comparison: None  Findings: Slight inflammatory enlargement right inferior nasal turbinates seen. Minimal 73mm and 3 mm posterior  nasal septal mucosal inflammatory polyps are seen. 9 mm mucous retention cyst is seen at the anteromedial inferior left maxillary antrum with paranasal sinuses otherwise clear. Bilateral infundibuli appear widely patent. No other significant abnormalities seen. 9 mm mucous retention cyst is seen at the anterior inferior left maxillary antrum (axial image 37). Visualized paranasal sinuses are otherwise clear. Middle ear cavities, mastoid air cells appear clear.  IMPRESSION:  1. Minimal rhinitis findings and 9 mm left maxillary mucous retention cyst as described. 2. Clear paranasal sinuses. 3. No additional significant abnormality  REVIEW OF SYSTEMS: Out of a complete 14 system review of symptoms, the patient complains only of the following symptoms, and all other reviewed systems are negative.  Headache  ALLERGIES: Allergies  Allergen Reactions  . Penicillins   . Terbinafine And Related     HOME MEDICATIONS: Outpatient Medications Prior to Visit  Medication  Sig Dispense Refill  . ALLEGRA-D ALLERGY & CONGESTION 180-240 MG 24 hr tablet Take 1 tablet by mouth daily.   3  . ALPRAZolam (XANAX) 0.5 MG tablet 0.5-1 tablet by mouth as needed.    [name] aspirin-acetaminophen-caffeine (EXCEDRIN MIGRAINE) 250-250-65 MG tablet Take by mouth every 6 (six) hours as needed for headache.    . desoximetasone (TOPICORT) 0.25 % cream Apply 1 application topically daily as needed.    [name] DEXILANT 60 MG capsule Take 60 mg by mouth every other day.    . docusate sodium (COLACE) 100 MG capsule Take 100 mg by mouth daily.     [name] eletriptan (RELPAX) 40 MG tablet Take 40 mg by mouth as needed.    . fluticasone (CUTIVATE) 0.005 % ointment Apply 1 application topically 2 (two) times daily.    . fluticasone (FLONASE) 50 MCG/ACT nasal spray USE 1 TO 2 SPRAYS IN EACH  NOSTRIL ONCE DAILY 48 g 2  . griseofulvin (GRIS-PEG) 250 MG tablet Take 250 mg by mouth daily.    [name] HYDROcodone-acetaminophen (NORCO) 10-325 MG tablet Take 1 tablet by mouth as needed.     [name] ibuprofen (ADVIL,MOTRIN) 600 MG tablet Take 600 mg by mouth as needed.    [name] ipratropium (ATROVENT) 0.06 % nasal spray USE 1 SPRAY NASALLY TWICE  DAILY 45 mL 0  . levothyroxine (SYNTHROID) 300 MCG tablet Take 300 mcg by mouth daily.    . Multiple Vitamins-Minerals (MULTIVITAMIN PO) Take by mouth.    [name] NAFTIN 2 % CREA as needed.    . naproxen sodium (ANAPROX) 550 MG tablet Take 550 mg by mouth as needed.    [name] NASAL SALINE NA Place into the nose as needed.    [name] omeprazole (PRILOSEC) 40 MG capsule Take 1-2 tablets by mouth at bedtime.     [name] POTASSIUM PO Take 99 mg by mouth.     . tamsulosin (FLOMAX) 0.4 MG CAPS capsule 1 capsule 30 minutes after the same meal each day    . topiramate (TOPAMAX) 200 MG tablet Take 1 tablet (200 mg total) by mouth daily. 90 tablet 3  . triamcinolone ointment (KENALOG) 0.1 % as needed.    Earnestine Mealing 625 MG tablet Take 3 tablets by mouth Twice daily.    [name] zolpidem (AMBIEN CR) 12.5 MG CR tablet Take 12.5 mg  by mouth at bedtime.      No facility-administered medications prior to visit.    PAST MEDICAL HISTORY: Past Medical History:  Diagnosis Date  . Allergic rhinitis due to dust   . Anxiety   . BPH (benign prostatic hyperplasia)   .  Bulging lumbar disc   . Celiac disease/sprue   . Concussion   . Deviated septum   . Dyshidrotic eczema   . Gastroesophageal reflux   . Hypothyroidism   . IBS (irritable bowel syndrome)   . Insomnia   . Low back pain   . Migraines   . Nephrolithiasis   . Neuropathy   . Onychomycosis   . OSA (obstructive sleep apnea)   . Osteopenia   . Peripheral neuropathy   . Rheumatic fever    65 y.o.  . Seborrheic keratosis   . Shingles     PAST SURGICAL HISTORY: Past Surgical History:  Procedure Laterality Date  . arthroscopic knee surgery Left   . COLONOSCOPY    . SEPTOPLASTY    . TENNIS ELBOW RELEASE/NIRSCHEL PROCEDURE    . VASECTOMY      FAMILY HISTORY: Family History  Problem Relation Age of Onset  . Alzheimer's disease Father        severe dementia  . High Cholesterol Father   . Gout Father   . Other Father        back problems  . Migraines Other        strong maternal fam hx   . High blood pressure Mother   . Lung cancer Mother        58  . Neuropathy Mother   . Gout Mother     SOCIAL HISTORY: Social History   Socioeconomic History  . Marital status: Married    Spouse name: Not on file  . Number of children: 2  . Years of education: 78  . Highest education level: Associate degree: academic program  Occupational History  . Occupation: printing  Tobacco Use  . Smoking status: Former Smoker    Types: Cigarettes    Quit date: 06/28/2006    Years since quitting: 13.6  . Smokeless tobacco: Never Used  Vaping Use  . Vaping Use: Never used  Substance and Sexual Activity  . Alcohol use: Not Currently    Comment: social, during bowling season  . Drug use: Not Currently    Comment: none in decades   . Sexual activity: Not on  file  Other Topics Concern  . Not on file  Social History Narrative   Lives at home with his wife   Left handed   2-3 Caffeine daily    Social Determinants of Health   Financial Resource Strain: Not on file  Food Insecurity: Not on file  Transportation Needs: Not on file  Physical Activity: Not on file  Stress: Not on file  Social Connections: Not on file  Intimate Partner Violence: Not on file   PHYSICAL EXAM  Vitals:   02/03/20 1522  BP: 130/84  Pulse: 70  Weight: 235 lb 6.4 oz (106.8 kg)  Height: 6' (1.829 m)   Body mass index is 31.93 kg/m.  Generalized: Well developed, in no acute distress   Neurological examination  Mentation: Alert oriented to time, place, history taking. Follows all commands speech and language fluent Cranial nerve II-XII: Pupils were equal round reactive to light. Extraocular movements were full, visual field were full on confrontational test. Facial sensation and strength were normal.  Head turning and shoulder shrug  were normal and symmetric. Motor: The motor testing reveals 5 over 5 strength of all 4 extremities. Good symmetric motor tone is noted throughout.  Sensory: Sensory testing is intact to soft touch on all 4 extremities. No evidence of extinction is noted.  Coordination: Cerebellar  testing reveals good finger-nose-finger and heel-to-shin bilaterally.  Gait and station: Gait is normal. Tandem gait is normal. Romberg is negative. No drift is seen.  Reflexes: Deep tendon reflexes are symmetric and normal bilaterally.   DIAGNOSTIC DATA (LABS, IMAGING, TESTING) - I reviewed patient records, labs, notes, testing and imaging myself where available.  No results found for: WBC, HGB, HCT, MCV, PLT    Component Value Date/Time   NA 144 11/21/2017 1555   K 3.9 11/21/2017 1555   CL 107 (H) 11/21/2017 1555   CO2 18 (L) 11/21/2017 1555   GLUCOSE 102 (H) 11/21/2017 1555   BUN 12 11/21/2017 1555   CREATININE 1.11 11/21/2017 1555   CALCIUM  9.6 11/21/2017 1555   PROT 7.3 11/13/2018 1657   GFRNONAA 72 11/21/2017 1555   GFRAA 83 11/21/2017 1555   No results found for: CHOL, HDL, LDLCALC, LDLDIRECT, TRIG, CHOLHDL Lab Results  Component Value Date   HGBA1C 5.7 (H) 11/13/2018   Lab Results  Component Value Date   VITAMINB12 440 11/13/2018   No results found for: TSH  ASSESSMENT AND PLAN 65 y.o. year old male  has a past medical history of Allergic rhinitis due to dust, Anxiety, BPH (benign prostatic hyperplasia), Bulging lumbar disc, Celiac disease/sprue, Concussion, Deviated septum, Dyshidrotic eczema, Gastroesophageal reflux, Hypothyroidism, IBS (irritable bowel syndrome), Insomnia, Low back pain, Migraines, Nephrolithiasis, Neuropathy, Onychomycosis, OSA (obstructive sleep apnea), Osteopenia, Peripheral neuropathy, Rheumatic fever, Seborrheic keratosis, and Shingles. here with:  1.  Chronic migraine headache -Start Ajovy 225 mg monthly injection for migraine prevention (given 3 samples today) -For now, continue Topamax 200 mg daily, no significant side effect currently -Try Nurtec 75 mg tablet for acute headache (given co-pay card today) -Likely rebound headache component, work on backing off frequent rescue medication, ideally taking no more than 2-3 days a week  -Migraines with typical pattern, location; neuro exam is reasurring -Will follow-up in 3 months or sooner if needed with Dr. Lucia Gaskins   I spent 30 minutes of face-to-face and non-face-to-face time with patient.  This included previsit chart review, lab review, study review, order entry, electronic health record documentation, patient education.  Margie Ege, AGNP-C, DNP 02/03/2020, 4:29 PM [facility] [address], [address] [place], [place] [address] [phone]  Made any corrections needed, and agree with history, physical, neuro exam,assessment and plan as stated.     Naomie Dean, MD [facility]

## 2020-02-03 ENCOUNTER — Ambulatory Visit: Payer: BC Managed Care – PPO | Admitting: Sports Medicine

## 2020-02-03 ENCOUNTER — Ambulatory Visit (INDEPENDENT_AMBULATORY_CARE_PROVIDER_SITE_OTHER): Payer: BC Managed Care – PPO | Admitting: Neurology

## 2020-02-03 ENCOUNTER — Other Ambulatory Visit: Payer: Self-pay

## 2020-02-03 ENCOUNTER — Encounter: Payer: Self-pay | Admitting: Neurology

## 2020-02-03 DIAGNOSIS — G43909 Migraine, unspecified, not intractable, without status migrainosus: Secondary | ICD-10-CM | POA: Diagnosis not present

## 2020-02-03 MED ORDER — AJOVY 225 MG/1.5ML ~~LOC~~ SOAJ: 225.0000 mg | SUBCUTANEOUS | 11 refills | Status: DC

## 2020-02-03 MED ORDER — NURTEC 75 MG PO TBDP: 75.0000 mg | ORAL_TABLET | ORAL | 11 refills | Status: DC | PRN

## 2020-02-03 NOTE — Patient Instructions (Signed)
Start Ajovy injection for migraine prevention  Try Nurtec for acute headache, take 1 at onset of headache, max is 1 tablet in 24 hours Continue the Topamax for now See you back 3 months  Fremanezumab injection What is this medicine? FREMANEZUMAB (fre ma NEZ ue mab) is used to prevent migraine headaches. This medicine may be used for other purposes; ask your health care provider or pharmacist if you have questions. COMMON BRAND NAME(S): AJOVY What should I tell my health care provider before I take this medicine? They need to know if you have any of these conditions:  an unusual or allergic reaction to fremanezumab, other medicines, foods, dyes, or preservatives  pregnant or trying to get pregnant  breast-feeding How should I use this medicine? This medicine is for injection under the skin. You will be taught how to prepare and give this medicine. Use exactly as directed. Take your medicine at regular intervals. Do not take your medicine more often than directed. It is important that you put your used needles and syringes in a special sharps container. Do not put them in a trash can. If you do not have a sharps container, call your pharmacist or healthcare provider to get one. Talk to your pediatrician regarding the use of this medicine in children. Special care may be needed. Overdosage: If you think you have taken too much of this medicine contact a poison control center or emergency room at once. NOTE: This medicine is only for you. Do not share this medicine with others. What if I miss a dose? If you miss a dose, take it as soon as you can. If it is almost time for your next dose, take only that dose. Do not take double or extra doses. What may interact with this medicine? Interactions are not expected. This list may not describe all possible interactions. Give your health care provider a list of all the medicines, herbs, non-prescription drugs, or dietary supplements you use. Also  tell them if you smoke, drink alcohol, or use illegal drugs. Some items may interact with your medicine. What should I watch for while using this medicine? Tell your doctor or healthcare professional if your symptoms do not start to get better or if they get worse. What side effects may I notice from receiving this medicine? Side effects that you should report to your doctor or health care professional as soon as possible:  allergic reactions like skin rash, itching or hives, swelling of the face, lips, or tongue Side effects that usually do not require medical attention (report these to your doctor or health care professional if they continue or are bothersome):  pain, redness, or irritation at site where injected This list may not describe all possible side effects. Call your doctor for medical advice about side effects. You may report side effects to FDA at 1-800-FDA-1088. Where should I keep my medicine? Keep out of the reach of children. You will be instructed on how to store this medicine. Throw away any unused medicine after the expiration date on the label. NOTE: This sheet is a summary. It may not cover all possible information. If you have questions about this medicine, talk to your doctor, pharmacist, or health care provider.  2020 Elsevier/Gold Standard (2016-10-16 17:22:56)  Rimegepant oral dissolving tablet What is this medicine? RIMEGEPANT (ri ME je pant) is used to treat migraine headaches with or without aura. An aura is a strange feeling or visual disturbance that warns you of an attack. It is  not used to prevent migraines. This medicine may be used for other purposes; ask your health care provider or pharmacist if you have questions. COMMON BRAND NAME(S): NURTEC ODT What should I tell my health care provider before I take this medicine? They need to know if you have any of these conditions:  kidney disease  liver disease  an unusual or allergic reaction to rimegepant,  other medicines, foods, dyes, or preservatives  pregnant or trying to get pregnant  breast-feeding How should I use this medicine? Take the medicine by mouth. Follow the directions on the prescription label. Leave the tablet in the sealed blister pack until you are ready to take it. With dry hands, open the blister and gently remove the tablet. If the tablet breaks or crumbles, throw it away and take a new tablet out of the blister pack. Place the tablet in the mouth and allow it to dissolve, and then swallow. Do not cut, crush, or chew this medicine. You do not need water to take this medicine. Talk to your pediatrician about the use of this medicine in children. Special care may be needed. Overdosage: If you think you have taken too much of this medicine contact a poison control center or emergency room at once. NOTE: This medicine is only for you. Do not share this medicine with others. What if I miss a dose? This does not apply. This medicine is not for regular use. What may interact with this medicine? This medicine may interact with the following medications:  certain medicines for fungal infections like fluconazole, itraconazole  rifampin This list may not describe all possible interactions. Give your health care provider a list of all the medicines, herbs, non-prescription drugs, or dietary supplements you use. Also tell them if you smoke, drink alcohol, or use illegal drugs. Some items may interact with your medicine. What should I watch for while using this medicine? Visit your health care professional for regular checks on your progress. Tell your health care professional if your symptoms do not start to get better or if they get worse. What side effects may I notice from receiving this medicine? Side effects that you should report to your doctor or health care professional as soon as possible:  allergic reactions like skin rash, itching or hives; swelling of the face, lips, or  tongue Side effects that usually do not require medical attention (report these to your doctor or health care professional if they continue or are bothersome):  nausea This list may not describe all possible side effects. Call your doctor for medical advice about side effects. You may report side effects to FDA at 1-800-FDA-1088. Where should I keep my medicine? Keep out of the reach of children. Store at room temperature between 15 and 30 degrees C (59 and 86 degrees F). Throw away any unused medicine after the expiration date. NOTE: This sheet is a summary. It may not cover all possible information. If you have questions about this medicine, talk to your doctor, pharmacist, or health care provider.  2020 Elsevier/Gold Standard (2018-04-01 00:21:31)

## 2020-02-04 DIAGNOSIS — M545 Low back pain, unspecified: Secondary | ICD-10-CM | POA: Diagnosis not present

## 2020-02-10 ENCOUNTER — Ambulatory Visit: Payer: BC Managed Care – PPO | Admitting: Sports Medicine

## 2020-02-10 DIAGNOSIS — L853 Xerosis cutis: Secondary | ICD-10-CM | POA: Diagnosis not present

## 2020-02-10 DIAGNOSIS — L819 Disorder of pigmentation, unspecified: Secondary | ICD-10-CM | POA: Diagnosis not present

## 2020-02-10 DIAGNOSIS — D229 Melanocytic nevi, unspecified: Secondary | ICD-10-CM | POA: Diagnosis not present

## 2020-02-10 DIAGNOSIS — L570 Actinic keratosis: Secondary | ICD-10-CM | POA: Diagnosis not present

## 2020-02-10 DIAGNOSIS — L738 Other specified follicular disorders: Secondary | ICD-10-CM | POA: Diagnosis not present

## 2020-02-12 ENCOUNTER — Other Ambulatory Visit: Payer: Self-pay

## 2020-02-12 ENCOUNTER — Telehealth: Payer: Self-pay | Admitting: Neurology

## 2020-02-12 ENCOUNTER — Ambulatory Visit (INDEPENDENT_AMBULATORY_CARE_PROVIDER_SITE_OTHER): Payer: BC Managed Care – PPO | Admitting: Sports Medicine

## 2020-02-12 VITALS — BP 150/84 | Ht 72.0 in | Wt 232.0 lb

## 2020-02-12 DIAGNOSIS — M7062 Trochanteric bursitis, left hip: Secondary | ICD-10-CM

## 2020-02-12 DIAGNOSIS — M7061 Trochanteric bursitis, right hip: Secondary | ICD-10-CM | POA: Diagnosis not present

## 2020-02-12 MED ORDER — METHYLPREDNISOLONE ACETATE 40 MG/ML IJ SUSP
40.0000 mg | Freq: Once | INTRAMUSCULAR | Status: AC
  Administered 2020-02-12: 40 mg via INTRA_ARTICULAR

## 2020-02-12 NOTE — Telephone Encounter (Signed)
Pt's wife, D'Arcy Abraha (on [place]) called, He only has 4 tablet left of Nurtec. Do you have any samples of Nurtec. Would like a call from the nurse.

## 2020-02-13 ENCOUNTER — Encounter: Payer: Self-pay | Admitting: Sports Medicine

## 2020-02-13 NOTE — Progress Notes (Signed)
   Subjective:    Patient ID: Joseph Fuentes, male    DOB: [birth year], 65 y.o.   MRN: [id number]  HPI   Joseph Fuentes comes in today for follow-up on low back pain.  Back pain has improved since he has not been golfing.  Recent MRI of his lumbar spine showed mild multilevel spondylosis including mild facet changes at multiple levels.  I believe his most recent pain was facet mediated. He is complaining today of bilateral lateral hip pain.  He has a history of greater trochanteric bursitis.  He admits that he has not been doing his exercises.  Symptoms have been present for several weeks but began to affect him while sleeping a couple of days ago.  He would like repeat injections today if possible.    Review of Systems As above    Objective:   Physical Exam  Well-developed, well-nourished.  No acute distress  Examination of both hips shows tenderness to palpation directly over the greater trochanter.  Smooth painless hip range of motion.  Neurovascular intact distally.      Assessment & Plan:   Returning bilateral hip pain secondary to greater trochanteric bursitis Improved low back pain secondary to facet arthropathy History of bulging lumbar disc with sciatica  Each of his greater trochanters are injected today with cortisone.  He tolerates this without difficulty.  I have reiterated the importance of hip abductor strengthening to help prevent this from reoccurring.  He understands.  In regards to his lumbar spine, we will hold on any further treatment at this time.  If his pain begins to return when he resumes golfing, then we may reconsider facet injections at that time.  Follow-up for ongoing or recalcitrant issues.  Consent obtained and verified. Time-out conducted. Noted no overlying erythema, induration, or other signs of local infection. Skin prepped in a sterile fashion. Topical analgesic spray: Ethyl chloride. Joint: Left hip greater trochanter Needle: 25-gauge 1.5  inch Completed without difficulty. Meds: 3 cc, 1% Xylocaine, 1 cc (40 mg) Depo-Medrol  Advised to call if fevers/chills, erythema, induration, drainage, or persistent bleeding.  Consent obtained and verified. Time-out conducted. Noted no overlying erythema, induration, or other signs of local infection. Skin prepped in a sterile fashion. Topical analgesic spray: Ethyl chloride. Joint: Right hip greater trochanter Needle: 25-gauge 1.5 inch Completed without difficulty. Meds: 3 cc, 1% Xylocaine, 1 cc (40 mg) Depo-Medrol  Advised to call if fevers/chills, erythema, induration, drainage, or persistent bleeding.

## 2020-02-16 ENCOUNTER — Ambulatory Visit: Payer: BC Managed Care – PPO | Admitting: Neurology

## 2020-02-17 MED ORDER — NURTEC 75 MG PO TBDP: ORAL_TABLET | ORAL | 0 refills | Status: DC

## 2020-02-17 NOTE — Addendum Note (Signed)
Addended by: Guy Begin on: 02/17/2020 02:52 PM   Modules accepted: Orders

## 2020-02-17 NOTE — Telephone Encounter (Signed)
Spoke to wife.  Pt has started the ajovy.  Has taken the nurtec, but at times has had to take relpax 2 hours later.  Only given 8 tab/30 days of nurtec.  Asking for samples will give 4 more tabs.  Placed up front to pick up on thursday.

## 2020-02-18 NOTE — Telephone Encounter (Signed)
Placed samples up front 

## 2020-02-23 DIAGNOSIS — M545 Low back pain, unspecified: Secondary | ICD-10-CM | POA: Diagnosis not present

## 2020-03-15 ENCOUNTER — Other Ambulatory Visit: Payer: Self-pay | Admitting: Family Medicine

## 2020-03-23 ENCOUNTER — Other Ambulatory Visit: Payer: Self-pay

## 2020-03-23 ENCOUNTER — Encounter: Payer: Self-pay | Admitting: Allergy and Immunology

## 2020-03-23 ENCOUNTER — Ambulatory Visit (INDEPENDENT_AMBULATORY_CARE_PROVIDER_SITE_OTHER): Payer: BC Managed Care – PPO | Admitting: Allergy and Immunology

## 2020-03-23 VITALS — BP 134/80 | HR 68 | Temp 97.7°F | Resp 18 | Wt 231.8 lb

## 2020-03-23 DIAGNOSIS — J3089 Other allergic rhinitis: Secondary | ICD-10-CM

## 2020-03-23 NOTE — Progress Notes (Signed)
Joseph Fuentes - Joseph Fuentes - Joseph Fuentes - Joseph Fuentes - Joseph Fuentes   Follow-up Note  Referring Provider: [name], MD Primary Provider: [name], MD Date of Office Visit: 03/23/2020  Subjective:   Joseph Fuentes (DOB: 1955/03/14) is a 65 y.o. male who [facility] in re-evaluation of the following:  HPI: Joseph Fuentes returns to this clinic in evaluation of allergic rhinoconjunctivitis.  His last visit to this clinic was 08 April 2019.  He has really done well while utilizing his plan which includes the use of Flonase and nasal ipratropium.  He has not required a systemic steroid or antibiotic for any type of airway issue and overall feels very good on his current plan.  He has received 3 Pfizer COVID vaccines and a flu vaccine.  He tells me that he had very significant migraines recently and he has started Ajovy over the course the past 2 months and eliminated all chocolate consumption and his headaches has been much better this month.  Allergies as of 03/23/2020      Reactions   Penicillins    Terbinafine And Related       Medication List      Ajovy 225 MG/1.5ML Soaj Generic drug: Fremanezumab-vfrm Inject 225 mg into the skin every 30 (thirty) days.   Allegra-D Allergy & Congestion 180-240 MG 24 hr tablet Generic drug: fexofenadine-pseudoephedrine Take 1 tablet by mouth daily.   ALPRAZolam 0.5 MG tablet Commonly known as: XANAX 0.5-1 tablet by mouth as needed.   aspirin-acetaminophen-caffeine 250-250-65 MG tablet Commonly known as: EXCEDRIN MIGRAINE Take by mouth every 6 (six) hours as needed for headache.   desoximetasone 0.25 % cream Commonly known as: TOPICORT Apply 1 application topically daily as needed.   Dexilant 60 MG capsule Generic drug: dexlansoprazole Take 60 mg by mouth every other day.   docusate sodium 100 MG capsule Commonly known as: COLACE Take 100 mg by mouth daily.   eletriptan 40 MG tablet Commonly  known as: RELPAX Take 40 mg by mouth as needed.   fluticasone 0.005 % ointment Commonly known as: CUTIVATE Apply 1 application topically 2 (two) times daily.   fluticasone 50 MCG/ACT nasal spray Commonly known as: FLONASE USE 1 TO 2 SPRAYS IN EACH  NOSTRIL ONCE DAILY   griseofulvin 250 MG tablet Commonly known as: GRIS-PEG Take 250 mg by mouth daily.   HYDROcodone-acetaminophen 10-325 MG tablet Commonly known as: NORCO Take 1 tablet by mouth as needed.   ibuprofen 600 MG tablet Commonly known as: ADVIL Take 600 mg by mouth as needed.   ipratropium 0.06 % nasal spray Commonly known as: ATROVENT USE 1 SPRAY NASALLY TWICE  DAILY   levothyroxine 300 MCG tablet Commonly known as: SYNTHROID Take 300 mcg by mouth daily.   MULTIVITAMIN PO Take by mouth.   Naftin 2 % Crea Generic drug: Naftifine HCl as needed.   naproxen sodium 550 MG tablet Commonly known as: ANAPROX Take 550 mg by mouth as needed.   NASAL SALINE NA Place into the nose as needed.   Nurtec 75 MG Tbdp Generic drug: Rimegepant Sulfate Take 75 mg by mouth as needed (take 1 tablet at onset of headache, max is 1 tablet in 24 hours).   Nurtec 75 MG Tbdp Generic drug: Rimegepant Sulfate One tablet daily for abortive therapy when has migraines. LOT [id number] exp 2024-06 x 2   omeprazole 40 MG capsule Commonly known as: PRILOSEC Take 1-2 tablets by mouth at bedtime.   POTASSIUM  PO Take 99 mg by mouth.   tamsulosin 0.4 MG Caps capsule Commonly known as: FLOMAX 1 capsule 30 minutes after the same meal each day   topiramate 200 MG tablet Commonly known as: TOPAMAX Take 1 tablet (200 mg total) by mouth daily.   triamcinolone ointment 0.1 % Commonly known as: KENALOG as needed.   Welchol 625 MG tablet Generic drug: colesevelam Take 3 tablets by mouth Twice daily.   zolpidem 12.5 MG CR tablet Commonly known as: AMBIEN CR Take 12.5 mg by mouth at bedtime.       Past Medical History:  Diagnosis  Date  . Allergic rhinitis due to dust   . Anxiety   . BPH (benign prostatic hyperplasia)   . Bulging lumbar disc   . Celiac disease/sprue   . Concussion   . Deviated septum   . Dyshidrotic eczema   . Gastroesophageal reflux   . Hypothyroidism   . IBS (irritable bowel syndrome)   . Insomnia   . Low back pain   . Migraines   . Nephrolithiasis   . Neuropathy   . Onychomycosis   . OSA (obstructive sleep apnea)   . Osteopenia   . Peripheral neuropathy   . Rheumatic fever    65 y.o.  . Seborrheic keratosis   . Shingles     Past Surgical History:  Procedure Laterality Date  . arthroscopic knee surgery Left   . COLONOSCOPY    . SEPTOPLASTY    . TENNIS ELBOW RELEASE/NIRSCHEL PROCEDURE    . VASECTOMY      Review of systems negative except as noted in HPI / PMHx or noted below:  Review of Systems  Constitutional: Negative.   HENT: Negative.   Eyes: Negative.   Respiratory: Negative.   Cardiovascular: Negative.   Gastrointestinal: Negative.   Genitourinary: Negative.   Musculoskeletal: Negative.   Skin: Negative.   Neurological: Negative.   Endo/Heme/Allergies: Negative.   Psychiatric/Behavioral: Negative.      Objective:   Vitals:   03/23/20 1608  BP: 134/80  Pulse: 68  Resp: 18  Temp: 97.7 F (36.5 C)  SpO2: 98%      Weight: 231 lb 12.8 oz (105.1 kg)   Physical Exam Constitutional:      Appearance: He is not diaphoretic.  HENT:     Head: Normocephalic.     Right Ear: Tympanic membrane, ear canal and external ear normal.     Left Ear: Tympanic membrane, ear canal and external ear normal.     Nose: Nose normal. No mucosal edema or rhinorrhea.     Mouth/Throat:     Mouth: Oropharynx is clear and moist and mucous membranes are normal.     Pharynx: Uvula midline. No oropharyngeal exudate.  Eyes:     Conjunctiva/sclera: Conjunctivae normal.  Neck:     Thyroid: No thyromegaly.     Trachea: Trachea normal. No tracheal tenderness or tracheal deviation.   Cardiovascular:     Rate and Rhythm: Normal rate and regular rhythm.     Heart sounds: Normal heart sounds, S1 normal and S2 normal. No murmur heard.   Pulmonary:     Effort: No respiratory distress.     Breath sounds: Normal breath sounds. No stridor. No wheezing or rales.  Musculoskeletal:        General: No edema.  Lymphadenopathy:     Head:     Right side of head: No tonsillar adenopathy.     Left side of head: No tonsillar adenopathy.  Cervical: No cervical adenopathy.  Skin:    Findings: No erythema or rash.     Nails: There is no clubbing.  Neurological:     Mental Status: He is alert.     Diagnostics: none  Assessment and Plan:   1. Other allergic rhinitis      1. Continue Flonase and nasal ipratropium (3 month + 3 refills)  2. Return to clinic in 12 months or earlier if problem  Overall Sedrick is really doing quite well on his current plan.  I have refilled his medications for a year.  He does not need to return to this clinic if his primary care doctor will refill his medications but I will be happy to see him back in his clinic should he develop problems in the future.  Laurette Schimke, MD Allergy / Immunology [facility]

## 2020-03-23 NOTE — Patient Instructions (Addendum)
  1. Continue Flonase and nasal ipratropium (3 month + 3 refills)  2. Return to clinic in 12 months or earlier if problem

## 2020-03-24 ENCOUNTER — Encounter: Payer: Self-pay | Admitting: Allergy and Immunology

## 2020-04-20 ENCOUNTER — Ambulatory Visit (INDEPENDENT_AMBULATORY_CARE_PROVIDER_SITE_OTHER): Payer: BC Managed Care – PPO | Admitting: Sports Medicine

## 2020-04-20 ENCOUNTER — Other Ambulatory Visit: Payer: Self-pay

## 2020-04-20 VITALS — BP 136/86 | Ht 72.0 in | Wt 232.0 lb

## 2020-04-20 DIAGNOSIS — M170 Bilateral primary osteoarthritis of knee: Secondary | ICD-10-CM

## 2020-04-20 MED ORDER — METHYLPREDNISOLONE ACETATE 40 MG/ML IJ SUSP
40.0000 mg | Freq: Once | INTRAMUSCULAR | Status: AC
  Administered 2020-04-20: 40 mg via INTRA_ARTICULAR

## 2020-04-21 ENCOUNTER — Encounter: Payer: Self-pay | Admitting: Sports Medicine

## 2020-04-21 NOTE — Progress Notes (Signed)
   Subjective:    Patient ID: Joseph Fuentes, male    DOB: 1956/01/17, 65 y.o.   MRN: [id number]  HPI chief complaint: Bilateral knee pain, left greater than right  Joseph Fuentes comes in today with returning left knee pain.  He has a documented history of left knee DJD.  An MRI done in February 2020 showed high-grade partial-thickness cartilage loss of the medial compartment and partial-thickness cartilage loss of the patellofemoral joint.  Cortisone injection administered in the fall 2021 provided some benefit.  His pain is overall tolerable but has become more noticeable recently as he is become more active.  He enjoys golfing and bowling.  He does wear a compression sleeve.  Although the majority of his pain is in the left knee, he is beginning to get similar pain in the right knee.  He has not noticed any swelling.  No mechanical symptoms.  No recent trauma.  He does occasionally take a combination of hydrocodone and ibuprofen approximately 1-1/2 hours prior to bowling or playing golf and this does seem to help.  Intra medical history reviewed Medications reviewed Allergies reviewed  Review of Systems As above   Objective:   Physical Exam  Well-developed, well-nourished.  No acute distress  Examination of both knees show range of motion from 0 to 130 degrees.  No effusion.  He is tender to palpation along the medial joint lines bilaterally.  No tenderness along the lateral joint line.  Negative McMurray's.  Knees are grossly stable ligamentous exam.  Neurovascularly intact distally.      Assessment & Plan:   Bilateral knee pain secondary to DJD  Each of the patient's knees were injected with cortisone today.  An anterior medial approach was utilized.  Patient tolerated this without difficulty.  He was also fitted with a new body helix compression sleeve to wear when active.  Patient was also requesting a handicap placard.  I filled out the appropriate paperwork for him to take to the [facility].  He  will follow-up as needed.  Consent obtained and verified. Time-out conducted. Noted no overlying erythema, induration, or other signs of local infection. Skin prepped in a sterile fashion. Topical analgesic spray: Ethyl chloride. Joint: right knee Needle: 25g 1.5 inch Completed without difficulty. Meds: 3cc 1% xylocaine, 1cc (40mg ) depomedrol  Advised to call if fevers/chills, erythema, induration, drainage, or persistent bleeding.  Consent obtained and verified. Time-out conducted. Noted no overlying erythema, induration, or other signs of local infection. Skin prepped in a sterile fashion. Topical analgesic spray: Ethyl chloride. Joint: left knee Needle: 25g 1.5 inch Completed without difficulty. Meds: 3cc 1% xylocaine, 1cc (40mg ) depomedrol  Advised to call if fevers/chills, erythema, induration, drainage, or persistent bleeding.

## 2020-05-10 MED ORDER — TOPIRAMATE 200 MG PO TABS: 200.0000 mg | ORAL_TABLET | Freq: Every day | ORAL | 0 refills | Status: DC

## 2020-05-11 NOTE — Progress Notes (Signed)
[facility]    Provider:  Dr Lucia Gaskins Referring Provider: Elias Fuentes, Joseph Fuentes Primary Care Physician:  Joseph Fuentes, Joseph Fuentes  CC:  Numbness in feet  05/12/2020: He was miserable with the headaches. He started ajovy and stopped chocolate and he fels better. The nurtec helps a little. 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 We discussed other options for acute management, we can try Ubrelvy  Patient complains of symptoms per HPI as well as the following symptoms: headache much improved, sleep apnea compliant on cpap. Pertinent negatives and positives per HPI. All others negative   [name]: 02/03/20  Joseph Fuentes is a very pleasant 65 year old male with long history of migraine headaches.  Previously, on Topamax 200 mg daily, he reported memory loss.  In the past, he started Emgality, for 3 months, weaned dose of Topamax, Emgality was not helpful, ultimately Topamax was increased back to 200 mg twice a day.  He was going to try Aimovig, never did.  For quite a while, his headaches have been fairly well controlled.  However, in the past 2 to 3 months, has had increase in frequency of headache, almost daily.  He got into a cycle of rebound headache, taking frequent Relpax, ibuprofen, Excedrin.  Migraines, having typical presentation, starts at the right cheek, radiates to the right temple, can be associated with right eye drooping, with migraine features.  The only change, is the last several months, he may have 2-3 migraines in 1 day.  As he has started to break away from the rebound cycle, headaches have improved.  He has not had a migraine in the last 2 days.  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 Currently remains on Topamax 200 mg daily.  Takes hydrocodone for chronic back pain.  Presents today for evaluation accompanied by his wife. Headaches have been so  bad, he had to walk [place] the golf course. Not interested in Botox.   11/13/2018; Patient is here as a new request from Dr. Renato Gails for new symptoms including numbness and pain in feet for years.He has a hx of uncontrolled migraines, morbid obesity, OSA on cpap, hypothyroidism, insomnia, peripheral neuropathy.  Patient was seen here in the past for migraines, he reduced Topamax due to concern for memory loss, he was started on Emgality which did not help and he was switched to Aimovig at last appointment and finally back to topamax and doing well.  He was referred  in the past for formal neurocognitive testing  and 1 of the neuropsychologist here in town declined the referral.   MRI Brain in November 2019 was largely unremarkable, normal for age.  His wife is here and provides much information. He has had feet feeling cold and had a test for circulation which was good. He wears socks when he goes to bed. Not cold to the touch but feels cold. Started 2 years ago. No inciting events. He is having new sharp pain shppting down his leg searing to his feet lasting 20-30 seconds, when sleeping, coming from his hips. He can't sleep on his back or stomach due to gerd so he sleeps on his sides and he feels pain in the lower legs on the top of the foot possibly coming from the hips however unclear but the hips do hurt. Hips will hurt when he walks long distance. He had shots in the hips and helped with  the hips pain. He plays golf and his hips are ok its just when he lays down on his sides after playing golf.   Interval history 11/21/2017: New referral from Dr. Elias Fuentes. He has a hx of uncontrolled migraines, morbid obesity, OSA on cpap, hypothyroidism, insomnia, peripheral neuropathy.[name] He is on Topamax 200mg  daily. Also on xanax, ambien. Here with his wife who also provides information. The biggest thing wife has noticed is that  he will drive down the road and then forgets where he is going for 4-5 seconds, panics. He  is having trouble remembering how to do his job that he has been doing for 30 years. He doesn't remember directions which is something he has always been very good at. He went to [place] and after he got there he didn't feel comfortable in a congested area. He sometimes doesn't know where he is even in familiar places in the car.  HE pays the bills and manages the finances. Dad is in memory care with severe dementia possibly alzheimers, started displaying in early 66s. Maternal grandmother alzheimers in early 53s. He is compliant but hasn't had it checked for years, he is very tired and never feels refreshed. He will bring in his chip so we can download only getting 5 hours sleep. No other focal neurologic deficits, associated symptoms, inciting events or modifiable factors.  Previous visit:  Joseph Fuentes is a 65 y.o. male here as a referral from Dr. 68 for migraines. PMHx migraines, HLD, IBS, hypothyroidism, insomnia, peripheral neuropathy. He has had migraines all his life. He started having migraines at the age of 5 or 6, a celiac diet helped when he was young. As he got older he didn't adhere to the diet. His baseline was 3-4 a month with exacerbations of 3 a week. After losing weight 30 pounds he didn't have many headaches during that timeframe. The last 6 months his migraines have been worse, no inciting events except when his diet changed. Migraines start in the right cheek and radiates to the back of the right eye and temple and whole side of the right face. Relpax helps if he catches it. 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 Ice pack helps. No auras or flashing lights. No prodrome. They can occur in the mid morning or later in the evening. He has insomnia at least one night a week. He takes Joseph Fuentes every night and still some nights.he has headaches every day and most are migainous. He takes excedrin 6x a  month, relpax 15x a month and ibuprofen 10x a month. No other focal neurologic deficits, associated symptoms, inciting events or modifiable factors.  Migraine medications tried: Topiramate, relpax  Reviewed notes, labs and imaging from outside physicians, which showed:  Reviewed primary care notes: He asked for 180 pills of Relpax, insurance only willing to dispense 36 tabs. Plan usually does not cover more than 12 tabs per month and will have to pay out of ocket for anything over that. Then patient said that he doesn't really need 180 Relpax every 90 days however after going for urine half without significant migraines his headaches have returned and now he's backed needing at least 72 tablets every 90 days. 72 tablets of Relpax were sent to the patient's pharmacy. He is on topiramate 150 mg at night, also Xanax, ibuprofen 800 mg 3 times a day, Excedrin extra strength when necessary per med  list. He is maintained on Topamax and back in July 2017 he was saying he was having 3-4 migraines per month. He uses Relpax with good response. He also has insomnia and currently taking Ambien. I reviewed exam which included normal general, head, eyes, ears, nose, throat, neck, chest, lungs, heart, extremities, skin.   CMP normal 03/24/2015 BUN 22 and creatinine 1.19, CBC 02/15/2015 normal  CT maxillofacial 04/23/2008 personally reviewed report:  Comparison: None   Findings: Slight inflammatory enlargement right inferior nasal turbinates seen.  Minimal 5mm and 3 mm posterior nasal septal mucosal inflammatory polyps are seen.  9 mm mucous retention cyst is seen at the anteromedial inferior left maxillary antrum with paranasal sinuses otherwise clear.  Bilateral infundibuli appear widely patent.  No other significant abnormalities seen. 9 mm mucous retention cyst is seen at the anterior inferior left maxillary antrum (axial image 37).  Visualized paranasal sinuses are otherwise clear.  Middle ear cavities,  mastoid air cells appear clear.   IMPRESSION:   1.  Minimal rhinitis findings and 9 mm left maxillary mucous retention cyst as described. 2.  Clear paranasal sinuses. 3.  No additional significant abnormality  Review of Systems: Patient complains of symptoms per HPI as well as the following symptoms: Feeling cold, allergies, runny nose, headache, insomnia. Pertinent negatives per HPI. All others negative.   Social History   Socioeconomic History  . Marital status: Married    Spouse name: Not on file  . Number of children: 2  . Years of education: 3214  . Highest education level: Associate degree: academic program  Occupational History  . Occupation: printing  Tobacco Use  . Smoking status: Former Smoker    Types: Cigarettes    Quit date: 06/28/2006    Years since quitting: 13.8  . Smokeless tobacco: Never Used  Vaping Use  . Vaping Use: Never used  Substance and Sexual Activity  . Alcohol use: Yes    Alcohol/week: 2.0 standard drinks    Types: 2 Cans of beer per week    Comment: social, during bowling season  . Drug use: Not Currently    Comment: none in decades   . Sexual activity: Not on file  Other Topics Concern  . Not on file  Social History Narrative   Lives at home with his wife   Left handed   2-3 Caffeine daily    Social Determinants of Health   Financial Resource Strain: Not on file  Food Insecurity: Not on file  Transportation Needs: Not on file  Physical Activity: Not on file  Stress: Not on file  Social Connections: Not on file  Intimate Partner Violence: Not on file    Family History  Problem Relation Age of Onset  . Alzheimer's disease Father        severe dementia  . High Cholesterol Father   . Gout Father   . Other Father        back problems  . Migraines Other        strong maternal fam hx   . High blood pressure Mother   . Lung cancer Mother        [id number]  . Neuropathy Mother   . Gout Mother     Past Medical History:  Diagnosis  Date  . Allergic rhinitis due to dust   . Anxiety   . BPH (benign prostatic hyperplasia)   . Bulging lumbar disc   . Celiac disease/sprue   . Concussion   . Deviated septum   .  Dyshidrotic eczema   . Gastroesophageal reflux   . Hypothyroidism   . IBS (irritable bowel syndrome)   . Insomnia   . Low back pain   . Migraines   . Nephrolithiasis   . Neuropathy   . Onychomycosis   . OSA (obstructive sleep apnea)   . Osteopenia   . Peripheral neuropathy   . Rheumatic fever    65 y.o.  . Seborrheic keratosis   . Shingles     Past Surgical History:  Procedure Laterality Date  . arthroscopic knee surgery Left   . COLONOSCOPY    . SEPTOPLASTY    . TENNIS ELBOW RELEASE/NIRSCHEL PROCEDURE    . VASECTOMY      Current Outpatient Medications  Medication Sig Dispense Refill  . ALLEGRA-D ALLERGY & CONGESTION 180-240 MG 24 hr tablet Take 1 tablet by mouth daily.   3  . ALPRAZolam (XANAX) 0.5 MG tablet 0.5-1 tablet by mouth as needed.    [name] aspirin-acetaminophen-caffeine (EXCEDRIN MIGRAINE) 250-250-65 MG tablet Take by mouth every 6 (six) hours as needed for headache.    . desoximetasone (TOPICORT) 0.25 % cream Apply 1 application topically daily as needed.    [name] DEXILANT 60 MG capsule Take 60 mg by mouth every other day.    . docusate sodium (COLACE) 100 MG capsule Take 100 mg by mouth daily.     . fluticasone (CUTIVATE) 0.005 % ointment Apply 1 application topically 2 (two) times daily.    . fluticasone (FLONASE) 50 MCG/ACT nasal spray USE 1 TO 2 SPRAYS IN EACH  NOSTRIL ONCE DAILY 48 g 2  . Fremanezumab-vfrm (AJOVY) 225 MG/1.5ML SOAJ Inject 225 mg into the skin every 30 (thirty) days. 1.5 mL 11  . griseofulvin (GRIS-PEG) 250 MG tablet Take 250 mg by mouth daily.    [name] HYDROcodone-acetaminophen (NORCO) 10-325 MG tablet Take 1 tablet by mouth as needed.     [name] ibuprofen (ADVIL,MOTRIN) 600 MG tablet Take 600 mg by mouth as needed.    [name] ipratropium (ATROVENT) 0.06 % nasal spray USE 1 SPRAY  NASALLY TWICE  DAILY 45 mL 0  . levothyroxine (SYNTHROID) 300 MCG tablet Take 300 mcg by mouth daily.    . Multiple Vitamins-Minerals (MULTIVITAMIN PO) Take by mouth.    [name] NAFTIN 2 % CREA as needed.    . naproxen sodium (ANAPROX) 550 MG tablet Take 550 mg by mouth as needed.    [name] NASAL SALINE NA Place into the nose as needed.    [name] omeprazole (PRILOSEC) 40 MG capsule Take 1-2 tablets by mouth at bedtime.     [name] POTASSIUM PO Take 99 mg by mouth.     . Rimegepant Sulfate (NURTEC) 75 MG TBDP Take 75 mg by mouth as needed (take 1 tablet at onset of headache, max is 1 tablet in 24 hours). 12 tablet 11  . tamsulosin (FLOMAX) 0.4 MG CAPS capsule 1 capsule 30 minutes after the same meal each day    . topiramate (TOPAMAX) 200 MG tablet Take 1 tablet (200 mg total) by mouth daily. 90 tablet 0  . triamcinolone ointment (KENALOG) 0.1 % as needed.    [name] Ubrogepant (UBRELVY) 100 MG TABS Take 100 mg by mouth every 2 (two) hours as needed. Maximum  a day. 16 tablet 11  . WELCHOL 625 MG tablet Take 3 tablets by mouth Twice daily.    [name] zolpidem (AMBIEN CR) 12.5 MG CR tablet Take 12.5 mg by mouth at bedtime.     [name] eletriptan (  RELPAX) 40 MG tablet Take 1 tablet (40 mg total) by mouth every 2 (two) hours as needed. Max 2x daily. 90 tablet 3   No current facility-administered medications for this visit.    Allergies as of 05/12/2020 - Review Complete 05/12/2020  Allergen Reaction Noted  . Penicillins    . Terbinafine and related  06/04/2018    Vitals: BP 115/76 (BP Location: Right Arm, Patient Position: Sitting)   Pulse 66   Ht 6' (1.829 m)   Wt 228 lb (103.4 kg)   BMI 30.92 kg/m  Last Weight:  Wt Readings from Last 1 Encounters:  05/12/20 228 lb (103.4 kg)   Last Height:   Ht Readings from Last 1 Encounters:  05/12/20 6' (1.829 m)   Exam: NAD, pleasant                  Speech:    Speech is normal; fluent and spontaneous with normal comprehension.  Cognition:    The patient is oriented to  person, place, and time;     recent and remote memory intact;     language fluent;    Cranial Nerves:    The pupils are equal, round, and reactive to light.Trigeminal sensation is intact and the muscles of mastication are normal. The face is symmetric. The palate elevates in the midline. Hearing intact. Voice is normal. Shoulder shrug is normal. The tongue has normal motion without fasciculations.   Coordination:  No dysmetria  Motor Observation:    No asymmetry, no atrophy, and no involuntary movements noted. Tone:    Normal muscle tone.     Strength:    Strength is V/V in the upper and lower limbs.      Sensation: intact to LT   Assessment/Plan:  65 y.o. male here as a referral from Dr. Doristine Counter for peripheral neuropathy and headache/migraines. as a past medical history of Allergic rhinitis due to dust, Anxiety, BPH (benign prostatic hyperplasia), Bulging lumbar disc, Celiac disease/sprue, Concussion, Deviated septum, Dyshidrotic eczema, Gastroesophageal reflux, Hypothyroidism, IBS (irritable bowel syndrome), Insomnia, Low back pain, Migraines, Nephrolithiasis, Neuropathy, Onychomycosis, OSA (obstructive sleep apnea), Osteopenia, Peripheral neuropathy, Rheumatic fever, Seborrheic keratosis, and Shingles. He reports numbness started in the toes 2-3 years ago.   -  There are multiple causes including metabolic, toxic, infectious and endocrine disorders, small vessel disease, autoimmune diseases, and others. We'll perform serum neuropathy screen. EMG/NCS would like be normal as small-fiber neuropathy cannot be assessed by this examination. (Hgba1c 5.7,b12/ife/b1/b6/heavy metals all normal, may be autoimmune disease causing his small-fiber neuropathy)  - He has degenerative disease in his back with possible nerve impingement at several levels, he has osteoarthritis and hip pain; when he lays on his side in bed he will get a shooting pain into his lower legs to the top of the foot especially  after he has played golf that day; he states it comes from his hips although not sure - differential includes L5/S1 radic, sciatic neuropathy (at the hips as the sciatic nerve travels through the notch vs piriformis or other sciatic neuropathy) also may be peroneal neuropathy as he lays on his side - we discussed at length and patient will pay more attention to the pattern of his pain and radiation  now that he knows that to look for.    - He has migraines, recently had severe exacerbation, failed aimovig and emgality however now on Ajovy and stopped chocolate and doing great. Nurtec works ok. Relpax is his favorite, hard to get  and expensive but helps tremendously and if th only triptan that has helped. Try Bernita Raisin. Gets a rebound component but is well aware of this phenomenon and is quite responsible in managing not taking too much acute medication.   Meds ordered this encounter  Medications  . Ubrogepant (UBRELVY) 100 MG TABS    Sig: Take 100 mg by mouth every 2 (two) hours as needed. Maximum 200mg  a day.    Dispense:  16 tablet    Refill:  11  . eletriptan (RELPAX) 40 MG tablet    Sig: Take 1 tablet (40 mg total) by mouth every 2 (two) hours as needed. Max 2x daily.    Dispense:  90 tablet    Refill:  3    This is a 3 month supply. Dispense 90 or max allowed by insurance.    Cc: Dr.  I spent over 30 minutes of face-to-face and non-face-to-face time with patient on the  1. Chronic migraine without aura without status migrainosus, not intractable    diagnosis.  This included previsit chart review, lab review, study review, order entry, electronic health record documentation, patient education on the different diagnostic and therapeutic options, counseling and coordination of care, risks and benefits of management, compliance, or risk factor reduction  Joseph Fuentes, Joseph Fuentes  [facility] [address] [place], [place]  Phone [phone] Fax  [phone]

## 2020-05-12 ENCOUNTER — Other Ambulatory Visit: Payer: Self-pay

## 2020-05-12 ENCOUNTER — Encounter: Payer: Self-pay | Admitting: Neurology

## 2020-05-12 ENCOUNTER — Ambulatory Visit (INDEPENDENT_AMBULATORY_CARE_PROVIDER_SITE_OTHER): Payer: BC Managed Care – PPO | Admitting: Neurology

## 2020-05-12 VITALS — BP 115/76 | HR 66 | Ht 72.0 in | Wt 228.0 lb

## 2020-05-12 DIAGNOSIS — G43709 Chronic migraine without aura, not intractable, without status migrainosus: Secondary | ICD-10-CM | POA: Insufficient documentation

## 2020-05-12 MED ORDER — ELETRIPTAN HYDROBROMIDE 40 MG PO TABS: 40.0000 mg | ORAL_TABLET | ORAL | 3 refills | Status: DC | PRN

## 2020-05-12 MED ORDER — UBRELVY 100 MG PO TABS: 100.0000 mg | ORAL_TABLET | ORAL | 11 refills | Status: DC | PRN

## 2020-05-13 ENCOUNTER — Other Ambulatory Visit: Payer: Self-pay | Admitting: Neurology

## 2020-05-13 DIAGNOSIS — G43709 Chronic migraine without aura, not intractable, without status migrainosus: Secondary | ICD-10-CM

## 2020-05-17 DIAGNOSIS — G4733 Obstructive sleep apnea (adult) (pediatric): Secondary | ICD-10-CM | POA: Diagnosis not present

## 2020-06-03 DIAGNOSIS — G4700 Insomnia, unspecified: Secondary | ICD-10-CM | POA: Diagnosis not present

## 2020-06-03 DIAGNOSIS — Z0000 Encounter for general adult medical examination without abnormal findings: Secondary | ICD-10-CM | POA: Diagnosis not present

## 2020-06-03 DIAGNOSIS — E039 Hypothyroidism, unspecified: Secondary | ICD-10-CM | POA: Diagnosis not present

## 2020-06-03 DIAGNOSIS — G43909 Migraine, unspecified, not intractable, without status migrainosus: Secondary | ICD-10-CM | POA: Diagnosis not present

## 2020-06-03 DIAGNOSIS — E782 Mixed hyperlipidemia: Secondary | ICD-10-CM | POA: Diagnosis not present

## 2020-06-08 DIAGNOSIS — M79671 Pain in right foot: Secondary | ICD-10-CM | POA: Diagnosis not present

## 2020-06-08 DIAGNOSIS — Z125 Encounter for screening for malignant neoplasm of prostate: Secondary | ICD-10-CM | POA: Diagnosis not present

## 2020-06-08 DIAGNOSIS — E782 Mixed hyperlipidemia: Secondary | ICD-10-CM | POA: Diagnosis not present

## 2020-06-08 DIAGNOSIS — E039 Hypothyroidism, unspecified: Secondary | ICD-10-CM | POA: Diagnosis not present

## 2020-06-08 DIAGNOSIS — Z0000 Encounter for general adult medical examination without abnormal findings: Secondary | ICD-10-CM | POA: Diagnosis not present

## 2020-06-08 DIAGNOSIS — R946 Abnormal results of thyroid function studies: Secondary | ICD-10-CM | POA: Diagnosis not present

## 2020-06-16 DIAGNOSIS — G4733 Obstructive sleep apnea (adult) (pediatric): Secondary | ICD-10-CM | POA: Diagnosis not present

## 2020-07-02 ENCOUNTER — Ambulatory Visit
Admission: RE | Admit: 2020-07-02 | Discharge: 2020-07-02 | Disposition: A | Discharge status: Home / Self Care | Payer: BC Managed Care – PPO | Source: Ambulatory Visit | Attending: Family Medicine | Admitting: Family Medicine

## 2020-07-02 ENCOUNTER — Other Ambulatory Visit: Payer: Self-pay | Admitting: Family Medicine

## 2020-07-02 DIAGNOSIS — M791 Myalgia, unspecified site: Secondary | ICD-10-CM | POA: Diagnosis not present

## 2020-07-02 DIAGNOSIS — R064 Hyperventilation: Secondary | ICD-10-CM

## 2020-07-02 DIAGNOSIS — Z131 Encounter for screening for diabetes mellitus: Secondary | ICD-10-CM | POA: Diagnosis not present

## 2020-07-02 DIAGNOSIS — R059 Cough, unspecified: Secondary | ICD-10-CM

## 2020-07-02 DIAGNOSIS — R946 Abnormal results of thyroid function studies: Secondary | ICD-10-CM | POA: Diagnosis not present

## 2020-07-02 DIAGNOSIS — R5383 Other fatigue: Secondary | ICD-10-CM | POA: Diagnosis not present

## 2020-07-02 DIAGNOSIS — R0689 Other abnormalities of breathing: Secondary | ICD-10-CM | POA: Diagnosis not present

## 2020-07-15 ENCOUNTER — Other Ambulatory Visit: Payer: Self-pay | Admitting: *Deleted

## 2020-07-15 DIAGNOSIS — G8929 Other chronic pain: Secondary | ICD-10-CM

## 2020-07-15 DIAGNOSIS — M545 Low back pain, unspecified: Secondary | ICD-10-CM

## 2020-07-16 DIAGNOSIS — G4733 Obstructive sleep apnea (adult) (pediatric): Secondary | ICD-10-CM | POA: Diagnosis not present

## 2020-07-20 ENCOUNTER — Other Ambulatory Visit: Payer: Self-pay | Admitting: Sports Medicine

## 2020-07-20 DIAGNOSIS — M545 Low back pain, unspecified: Secondary | ICD-10-CM

## 2020-07-20 DIAGNOSIS — G8929 Other chronic pain: Secondary | ICD-10-CM

## 2020-07-28 ENCOUNTER — Other Ambulatory Visit: Payer: Self-pay | Admitting: Neurology

## 2020-07-28 DIAGNOSIS — G43709 Chronic migraine without aura, not intractable, without status migrainosus: Secondary | ICD-10-CM

## 2020-07-30 ENCOUNTER — Other Ambulatory Visit: Payer: Self-pay

## 2020-07-30 ENCOUNTER — Ambulatory Visit
Admission: RE | Admit: 2020-07-30 | Discharge: 2020-07-30 | Disposition: A | Discharge status: Home / Self Care | Payer: BC Managed Care – PPO | Source: Ambulatory Visit | Attending: Sports Medicine | Admitting: Sports Medicine

## 2020-07-30 DIAGNOSIS — M47817 Spondylosis without myelopathy or radiculopathy, lumbosacral region: Secondary | ICD-10-CM | POA: Diagnosis not present

## 2020-07-30 DIAGNOSIS — G8929 Other chronic pain: Secondary | ICD-10-CM

## 2020-07-30 DIAGNOSIS — M545 Low back pain, unspecified: Secondary | ICD-10-CM

## 2020-07-30 MED ORDER — IOPAMIDOL (ISOVUE-M 200) INJECTION 41%
1.0000 mL | Freq: Once | INTRAMUSCULAR | Status: AC
  Administered 2020-07-30: 1 mL via EPIDURAL

## 2020-07-30 MED ORDER — DIAZEPAM 5 MG PO TABS
5.0000 mg | ORAL_TABLET | Freq: Once | ORAL | Status: AC
  Administered 2020-07-30: 5 mg via ORAL

## 2020-07-30 MED ORDER — METHYLPREDNISOLONE ACETATE 40 MG/ML INJ SUSP (RADIOLOG
80.0000 mg | Freq: Once | INTRAMUSCULAR | Status: AC
  Administered 2020-07-30: 80 mg via EPIDURAL

## 2020-07-30 NOTE — Discharge Instructions (Signed)

## 2020-08-01 ENCOUNTER — Other Ambulatory Visit: Payer: Self-pay | Admitting: Neurology

## 2020-08-03 ENCOUNTER — Other Ambulatory Visit: Payer: Self-pay

## 2020-08-03 ENCOUNTER — Ambulatory Visit (INDEPENDENT_AMBULATORY_CARE_PROVIDER_SITE_OTHER): Payer: BC Managed Care – PPO | Admitting: Sports Medicine

## 2020-08-03 VITALS — BP 128/78 | Ht 72.0 in | Wt 228.0 lb

## 2020-08-03 DIAGNOSIS — M1712 Unilateral primary osteoarthritis, left knee: Secondary | ICD-10-CM

## 2020-08-03 MED ORDER — METHYLPREDNISOLONE ACETATE 40 MG/ML IJ SUSP
40.0000 mg | Freq: Once | INTRAMUSCULAR | Status: AC
  Administered 2020-08-03: 40 mg via INTRA_ARTICULAR

## 2020-08-04 ENCOUNTER — Other Ambulatory Visit: Payer: Self-pay | Admitting: Neurology

## 2020-08-04 DIAGNOSIS — G43709 Chronic migraine without aura, not intractable, without status migrainosus: Secondary | ICD-10-CM

## 2020-08-04 MED ORDER — ELETRIPTAN HYDROBROMIDE 40 MG PO TABS: 40.0000 mg | ORAL_TABLET | ORAL | 3 refills | Status: DC | PRN

## 2020-08-04 NOTE — Progress Notes (Signed)
   Subjective:    Patient ID: Joseph Fuentes, male    DOB: 1955/05/10, 66 y.o.   MRN: [id number]  HPI chief complaint: Left knee pain  Ezriel presents today with persistent left knee pain.  His last cortisone injection in March provided him with minimal symptom relief.  In fact, his pain is getting worse and beginning to interfere with his activities of daily living.  He does endorse some mechanical symptoms including painful popping and catching in his knee.  MRI of his left knee done in 2020 showed no evidence of significant meniscal pathology but he did have advanced degenerative changes, particularly in the medial compartment.  He does endorse some occasional mild swelling but denies significant stiffness.  No recent trauma.    Review of Systems As above    Objective:   Physical Exam  Well-developed, well-nourished.  No acute distress  Left knee: Range of motion 0 to 130 degrees.  Trace effusion.  Tender to palpation along the medial joint line.  Pain but no popping with McMurray's.  Knee is stable to ligamentous exam.  Neurovascularly intact distally.  MRI of the left knee from 2020 as above      Assessment & Plan:   Persistent left knee pain likely secondary to DJD  Although the patient has had multiple cortisone injections in the past, the most recent one in March, he would like to try a repeat injection today.  The left knee is injected with cortisone utilizing an anterior lateral approach.  He tolerates this without difficulty.  At this point in time I think he needs to meet with an orthopedist to discuss possible surgical intervention.  He may also be a candidate for viscosupplementation.  I will refer the patient to Dr. Lequita Halt to discuss these options further.  Xayvier is in agreement with that plan.  He will follow-up with me as needed.  Consent obtained and verified. Time-out conducted. Noted no overlying erythema, induration, or other signs of local infection. Skin  prepped in a sterile fashion. Topical analgesic spray: Ethyl chloride. Joint: ;eft knee Needle: 25g 1.5 inch Completed without difficulty. Meds: 3cc 1% xylocaine, 1cc (40mg ) depomedrol  Advised to call if fevers/chills, erythema, induration, drainage, or persistent bleeding.

## 2020-08-06 DIAGNOSIS — R311 Benign essential microscopic hematuria: Secondary | ICD-10-CM | POA: Diagnosis not present

## 2020-08-06 DIAGNOSIS — N5201 Erectile dysfunction due to arterial insufficiency: Secondary | ICD-10-CM | POA: Diagnosis not present

## 2020-08-06 DIAGNOSIS — N401 Enlarged prostate with lower urinary tract symptoms: Secondary | ICD-10-CM | POA: Diagnosis not present

## 2020-08-19 DIAGNOSIS — G4733 Obstructive sleep apnea (adult) (pediatric): Secondary | ICD-10-CM | POA: Diagnosis not present

## 2020-09-20 DIAGNOSIS — G4733 Obstructive sleep apnea (adult) (pediatric): Secondary | ICD-10-CM | POA: Diagnosis not present

## 2020-09-23 DIAGNOSIS — M25562 Pain in left knee: Secondary | ICD-10-CM | POA: Diagnosis not present

## 2020-09-27 DIAGNOSIS — E039 Hypothyroidism, unspecified: Secondary | ICD-10-CM | POA: Diagnosis not present

## 2020-10-06 DIAGNOSIS — M1712 Unilateral primary osteoarthritis, left knee: Secondary | ICD-10-CM | POA: Diagnosis not present

## 2020-10-16 ENCOUNTER — Other Ambulatory Visit: Payer: Self-pay | Admitting: Neurology

## 2020-10-20 DIAGNOSIS — G4733 Obstructive sleep apnea (adult) (pediatric): Secondary | ICD-10-CM | POA: Diagnosis not present

## 2020-10-26 DIAGNOSIS — E039 Hypothyroidism, unspecified: Secondary | ICD-10-CM | POA: Diagnosis not present

## 2020-11-02 ENCOUNTER — Telehealth: Payer: Self-pay | Admitting: Allergy and Immunology

## 2020-11-02 ENCOUNTER — Ambulatory Visit (INDEPENDENT_AMBULATORY_CARE_PROVIDER_SITE_OTHER): Payer: BC Managed Care – PPO | Admitting: Family Medicine

## 2020-11-02 VITALS — Ht 72.0 in | Wt 223.0 lb

## 2020-11-02 DIAGNOSIS — G8929 Other chronic pain: Secondary | ICD-10-CM | POA: Diagnosis not present

## 2020-11-02 DIAGNOSIS — M25552 Pain in left hip: Secondary | ICD-10-CM

## 2020-11-02 DIAGNOSIS — M25551 Pain in right hip: Secondary | ICD-10-CM

## 2020-11-02 MED ORDER — IPRATROPIUM BROMIDE 0.06 % NA SOLN: NASAL | 0 refills | Status: DC

## 2020-11-02 MED ORDER — FLUTICASONE PROPIONATE 50 MCG/ACT NA SUSP: 2.0000 | Freq: Every day | NASAL | 0 refills | Status: DC

## 2020-11-02 MED ORDER — METHYLPREDNISOLONE ACETATE 40 MG/ML IJ SUSP
40.0000 mg | Freq: Once | INTRAMUSCULAR | Status: AC
  Administered 2020-11-02: 40 mg via INTRA_ARTICULAR

## 2020-11-02 NOTE — Telephone Encounter (Signed)
Pt called requesting refill for his fluticasone (flonase) and ipatropium (atrovent), pt states he usually receives a 3 month supply at at time but has not been able to get any refills.   [facility]  Best contact number for patient: [phone]

## 2020-11-02 NOTE — Telephone Encounter (Signed)
Sent in refills as pt is not due for follow up until 2/23

## 2020-11-02 NOTE — Progress Notes (Signed)
PCP: [name], MD  Subjective:   HPI: Patient is a 65 y.o. male here for bilateral hip pain.  History of greater trochanteric pain syndrome and has found relief with injections. He usually has stiffness on day 2 after injections. Dr.Draper made adjustments to injections and he has found he is only stiff for one day after injection. Patient has left knee replacement planned for next Wednesday and a company golf tournament on Thursday. His surgeon recommended no injections past Friday, so patient would like to have at least one injection today. He is concerned if he has both injections it will prevent him from participating in golf tournament on Thursday.  Pain lateral hips, worse lying on his side.  No groin pain.  Past Medical History:  Diagnosis Date   Allergic rhinitis due to dust    Anxiety    BPH (benign prostatic hyperplasia)    Bulging lumbar disc    Celiac disease/sprue    Concussion    Deviated septum    Dyshidrotic eczema    Gastroesophageal reflux    Hypothyroidism    IBS (irritable bowel syndrome)    Insomnia    Low back pain    Migraines    Nephrolithiasis    Neuropathy    Onychomycosis    OSA (obstructive sleep apnea)    Osteopenia    Peripheral neuropathy    Rheumatic fever    65 y.o.   Seborrheic keratosis    Shingles     Current Outpatient Medications on File Prior to Visit  Medication Sig Dispense Refill   ALLEGRA-D ALLERGY & CONGESTION 180-240 MG 24 hr tablet Take 1 tablet by mouth daily.   3   ALPRAZolam (XANAX) 0.5 MG tablet 0.5-1 tablet by mouth as needed.     aspirin-acetaminophen-caffeine (EXCEDRIN MIGRAINE) 250-250-65 MG tablet Take by mouth every 6 (six) hours as needed for headache.     desoximetasone (TOPICORT) 0.25 % cream Apply 1 application topically daily as needed.     DEXILANT 60 MG capsule Take 60 mg by mouth every other day.     docusate sodium (COLACE) 100 MG capsule Take 100 mg by mouth daily.      eletriptan (RELPAX) 40 MG tablet  Take 1 tablet (40 mg total) by mouth every 2 (two) hours as needed for migraine or headache. Maximum twice daily. 42 tablet 3   fluticasone (CUTIVATE) 0.005 % ointment Apply 1 application topically 2 (two) times daily.     Fremanezumab-vfrm (AJOVY) 225 MG/1.5ML SOAJ Inject 225 mg into the skin every 30 (thirty) days. 1.5 mL 11   griseofulvin (GRIS-PEG) 250 MG tablet Take 250 mg by mouth daily.     HYDROcodone-acetaminophen (NORCO) 10-325 MG tablet Take 1 tablet by mouth as needed.      ibuprofen (ADVIL,MOTRIN) 600 MG tablet Take 600 mg by mouth as needed.     levothyroxine (SYNTHROID) 300 MCG tablet Take 300 mcg by mouth daily.     Multiple Vitamins-Minerals (MULTIVITAMIN PO) Take by mouth.     NAFTIN 2 % CREA as needed.     naproxen sodium (ANAPROX) 550 MG tablet Take 550 mg by mouth as needed.     NASAL SALINE NA Place into the nose as needed.     omeprazole (PRILOSEC) 40 MG capsule Take 1-2 tablets by mouth at bedtime.      POTASSIUM PO Take 99 mg by mouth.      Rimegepant Sulfate (NURTEC) 75 MG TBDP Take 75 mg by mouth as  needed (take 1 tablet at onset of headache, max is 1 tablet in 24 hours). 12 tablet 11   tamsulosin (FLOMAX) 0.4 MG CAPS capsule 1 capsule 30 minutes after the same meal each day     topiramate (TOPAMAX) 200 MG tablet TAKE 1 TABLET DAILY 90 tablet 4   triamcinolone ointment (KENALOG) 0.1 % as needed.     Ubrogepant (UBRELVY) 100 MG TABS Take 100 mg by mouth every 2 (two) hours as needed. Maximum 200mg  a day. 16 tablet 11   WELCHOL 625 MG tablet Take 3 tablets by mouth Twice daily.     zolpidem (AMBIEN CR) 12.5 MG CR tablet Take 12.5 mg by mouth at bedtime.      No current facility-administered medications on file prior to visit.    Past Surgical History:  Procedure Laterality Date   arthroscopic knee surgery Left    COLONOSCOPY     SEPTOPLASTY     TENNIS ELBOW RELEASE/NIRSCHEL PROCEDURE     VASECTOMY      Allergies  Allergen Reactions   Penicillins      Reaction occurred as an infant; parents told him never to take PCN   Terbinafine And Related Rash    All over body    Ht 6' (1.829 m)   Wt 223 lb (101.2 kg)   BMI 30.24 kg/m   [facility] 10/28/2019 12/23/2019 11/02/2020  Frequency of aerobic exercise (# of days/week) 2 2 3   Average time in minutes 45 45 0  Frequency of strengthening activities (# of days/week) 0 0 0    No flowsheet data found.      Objective:  Physical Exam:   Gen: NAD, comfortable in exam room  Bilateral hips: No deformity. FROM with 5/5 strength including abduction. TTP over greater trochanteric area of both hips NVI distally. Negative logroll Negative faber, fadir   Assessment & Plan:  1. Greater Trochanteric Pain Syndrome -Received injection in right greater trochanteric area today. Patient deferred left side injection. Home exercises, stretches as previously. -Follow up as needed   After informed written consent timeout was performed, patient was lying on left side on exam table.  Area overlying right trochanteric bursa prepped with alcohol swab then injected with 3:1 lidocaine: depomedrol 40mg .  Patient tolerated procedure well without immediate complications.

## 2020-11-08 DIAGNOSIS — Z0189 Encounter for other specified special examinations: Secondary | ICD-10-CM | POA: Diagnosis not present

## 2020-11-10 DIAGNOSIS — M1712 Unilateral primary osteoarthritis, left knee: Secondary | ICD-10-CM | POA: Diagnosis not present

## 2020-11-19 DIAGNOSIS — G4733 Obstructive sleep apnea (adult) (pediatric): Secondary | ICD-10-CM | POA: Diagnosis not present

## 2020-11-22 DIAGNOSIS — E039 Hypothyroidism, unspecified: Secondary | ICD-10-CM | POA: Diagnosis not present

## 2020-12-09 ENCOUNTER — Ambulatory Visit (INDEPENDENT_AMBULATORY_CARE_PROVIDER_SITE_OTHER): Payer: BC Managed Care – PPO | Admitting: Sports Medicine

## 2020-12-09 VITALS — BP 106/70 | Ht 72.0 in | Wt 212.0 lb

## 2020-12-09 DIAGNOSIS — M7062 Trochanteric bursitis, left hip: Secondary | ICD-10-CM | POA: Diagnosis not present

## 2020-12-09 DIAGNOSIS — G43909 Migraine, unspecified, not intractable, without status migrainosus: Secondary | ICD-10-CM | POA: Diagnosis not present

## 2020-12-09 DIAGNOSIS — E782 Mixed hyperlipidemia: Secondary | ICD-10-CM | POA: Diagnosis not present

## 2020-12-09 DIAGNOSIS — M25552 Pain in left hip: Secondary | ICD-10-CM

## 2020-12-09 DIAGNOSIS — E039 Hypothyroidism, unspecified: Secondary | ICD-10-CM | POA: Diagnosis not present

## 2020-12-09 DIAGNOSIS — G4700 Insomnia, unspecified: Secondary | ICD-10-CM | POA: Diagnosis not present

## 2020-12-09 DIAGNOSIS — Z0000 Encounter for general adult medical examination without abnormal findings: Secondary | ICD-10-CM | POA: Diagnosis not present

## 2020-12-09 MED ORDER — METHYLPREDNISOLONE ACETATE 40 MG/ML IJ SUSP
40.0000 mg | Freq: Once | INTRAMUSCULAR | Status: AC
  Administered 2020-12-09: 40 mg via INTRA_ARTICULAR

## 2020-12-09 NOTE — Progress Notes (Signed)
   Subjective:    Patient ID: Joseph Fuentes, male    DOB: 10/10/1955, 65 y.o.   MRN: [id number]  HPI chief complaint: Left hip pain  Joseph Fuentes presents today with returning lateral left hip pain.  He has a history of greater trochanteric bursitis/greater trochanteric pain syndrome.  His right greater trochanter was injected with steroids in early October.  It did help but he still a little bit sore in the lateral hip.  He is status post left knee total knee arthroplasty done earlier this month.  He is working diligently in physical therapy.  Is also experiencing some unintentional weight loss of approximately 15 pounds.  He has an appointment to follow-up with his PCP later this afternoon to discuss this.  Interim medical history reviewed Medications reviewed Allergies reviewed    Review of Systems As above    Objective:   Physical Exam  Well-developed, well-nourished.  No acute distress.  Awake alert and oriented x3.  Vital signs reviewed  Left hip: Smooth painless hip range of motion with a negative logroll.  He is tender to palpation directly over the left greater trochanter.  Left knee: Approximately a 5 degree extension lag.  Flexion to 100 degrees.  Well approximated and healing surgical incision consistent with recent total knee arthroplasty.      Assessment & Plan:   Returning left hip pain secondary to greater trochanteric bursitis Status post total knee arthroplasty, left knee Unintentional weight loss  Patient's left greater trochanteric bursa is injected today with cortisone.  This is accomplished atraumatically under sterile technique without complication.  I explained to Joseph Fuentes that we could reinject his right greater trochanteric bursa in 2 to 3 weeks if his pain persists.  Otherwise, he will follow-up for his total knee arthroplasty as scheduled and will follow-up with his PCP later this afternoon to discuss his unintentional weight loss.  Consent obtained and  verified. Time-out conducted. Noted no overlying erythema, induration, or other signs of local infection. Skin prepped in a sterile fashion. Topical analgesic spray: Ethyl chloride. Joint: Left greater trochanter Needle: 1.5 inch 25-gauge Completed without difficulty. Meds: 3 cc 1% Xylocaine, 1 cc (40 mg) Depo-Medrol  Advised to call if fevers/chills, erythema, induration, drainage, or persistent bleeding.   This note was dictated using Dragon naturally speaking software and may contain errors in syntax, spelling, or content which have not been identified prior to signing this note.

## 2020-12-14 DIAGNOSIS — G4733 Obstructive sleep apnea (adult) (pediatric): Secondary | ICD-10-CM | POA: Diagnosis not present

## 2020-12-14 DIAGNOSIS — R1310 Dysphagia, unspecified: Secondary | ICD-10-CM | POA: Diagnosis not present

## 2020-12-20 DIAGNOSIS — G4733 Obstructive sleep apnea (adult) (pediatric): Secondary | ICD-10-CM | POA: Diagnosis not present

## 2020-12-21 DIAGNOSIS — K317 Polyp of stomach and duodenum: Secondary | ICD-10-CM | POA: Diagnosis not present

## 2020-12-21 DIAGNOSIS — K219 Gastro-esophageal reflux disease without esophagitis: Secondary | ICD-10-CM | POA: Diagnosis not present

## 2020-12-21 DIAGNOSIS — R1310 Dysphagia, unspecified: Secondary | ICD-10-CM | POA: Diagnosis not present

## 2021-01-13 ENCOUNTER — Telehealth: Payer: Self-pay | Admitting: Allergy and Immunology

## 2021-01-19 DIAGNOSIS — G4733 Obstructive sleep apnea (adult) (pediatric): Secondary | ICD-10-CM | POA: Diagnosis not present

## 2021-01-24 DIAGNOSIS — J069 Acute upper respiratory infection, unspecified: Secondary | ICD-10-CM | POA: Diagnosis not present

## 2021-01-26 DIAGNOSIS — R519 Headache, unspecified: Secondary | ICD-10-CM | POA: Diagnosis not present

## 2021-01-26 DIAGNOSIS — Z20828 Contact with and (suspected) exposure to other viral communicable diseases: Secondary | ICD-10-CM | POA: Diagnosis not present

## 2021-01-26 DIAGNOSIS — Z03818 Encounter for observation for suspected exposure to other biological agents ruled out: Secondary | ICD-10-CM | POA: Diagnosis not present

## 2021-01-26 DIAGNOSIS — R051 Acute cough: Secondary | ICD-10-CM | POA: Diagnosis not present

## 2021-01-26 DIAGNOSIS — R509 Fever, unspecified: Secondary | ICD-10-CM | POA: Diagnosis not present

## 2021-01-27 MED ORDER — IPRATROPIUM BROMIDE 0.06 % NA SOLN: NASAL | 0 refills | Status: DC

## 2021-01-27 MED ORDER — FLUTICASONE PROPIONATE 50 MCG/ACT NA SUSP: 2.0000 | Freq: Every day | NASAL | 0 refills | Status: DC

## 2021-01-27 NOTE — Telephone Encounter (Signed)
Patient is requesting refill for ipratropium and flonase (3 month supply for each one). Patient was told by pharmacy he needs an appointment, patient has an appointment for April 05, 2021 with Dr. Lucie Leather as his yearly.   Patient is needing refill before end of the year as it is free for him since his insurance covers medication.   [facility]  Patient is requesting a call back once refill is sent in [phone]

## 2021-01-27 NOTE — Telephone Encounter (Signed)
Sent in [facility] day supply refills [facility] for ipratropium and flonase

## 2021-01-27 NOTE — Addendum Note (Signed)
Addended by: Berna Bue on: 01/27/2021 10:46 AM   Modules accepted: Orders

## 2021-02-12 ENCOUNTER — Other Ambulatory Visit: Payer: Self-pay | Admitting: Neurology

## 2021-02-17 ENCOUNTER — Encounter: Payer: Self-pay | Admitting: Neurology

## 2021-02-17 ENCOUNTER — Encounter (HOSPITAL_BASED_OUTPATIENT_CLINIC_OR_DEPARTMENT_OTHER): Payer: Self-pay

## 2021-02-17 DIAGNOSIS — G4733 Obstructive sleep apnea (adult) (pediatric): Secondary | ICD-10-CM

## 2021-02-17 MED ORDER — AJOVY 225 MG/1.5ML ~~LOC~~ SOAJ: 225.0000 mg | SUBCUTANEOUS | 0 refills | Status: DC

## 2021-03-08 ENCOUNTER — Other Ambulatory Visit: Payer: Self-pay

## 2021-03-08 ENCOUNTER — Ambulatory Visit (HOSPITAL_BASED_OUTPATIENT_CLINIC_OR_DEPARTMENT_OTHER): Payer: BC Managed Care – PPO | Attending: Otolaryngology | Admitting: Internal Medicine

## 2021-03-08 VITALS — Ht 72.0 in | Wt 219.0 lb

## 2021-03-08 DIAGNOSIS — G4733 Obstructive sleep apnea (adult) (pediatric): Secondary | ICD-10-CM | POA: Diagnosis not present

## 2021-03-18 ENCOUNTER — Ambulatory Visit (HOSPITAL_BASED_OUTPATIENT_CLINIC_OR_DEPARTMENT_OTHER): Payer: BC Managed Care – PPO | Attending: Internal Medicine | Admitting: Internal Medicine

## 2021-03-25 ENCOUNTER — Other Ambulatory Visit: Payer: Self-pay | Admitting: Allergy and Immunology

## 2021-03-26 DIAGNOSIS — G4733 Obstructive sleep apnea (adult) (pediatric): Secondary | ICD-10-CM | POA: Diagnosis not present

## 2021-03-26 NOTE — Procedures (Signed)
° °  Patient Name: Joseph Fuentes, Joseph Fuentes Date: 03/19/2021 Gender: Male D.O.B: 11-15-1955 Age (years): 65 Referring Provider: Christia Reading Height (inches): 72 Interpreting Physician: Jetty Duhamel MD, ABSM Weight (lbs): 219 RPSGT: [place] BMI: 30 MRN: [id number] Neck Size: 17.00  CLINICAL INFORMATION Sleep Study Type: HST Indication for sleep study: OSA Epworth Sleepiness Score: 3  SLEEP STUDY TECHNIQUE A multi-channel overnight portable sleep study was performed. The channels recorded were: nasal airflow, thoracic respiratory movement, and oxygen saturation with a pulse oximetry. Snoring was also monitored.  MEDICATIONS Patient self administered medications include: none reported.  SLEEP ARCHITECTURE Patient was studied for 371 minutes. The sleep efficiency was 100.0 % and the patient was supine for 25%. The arousal index was 0.0 per hour.  RESPIRATORY PARAMETERS The overall AHI was 32.8 per hour, with a central apnea index of 0 per hour. The oxygen nadir was 83% during sleep.  CARDIAC DATA Mean heart rate during sleep was 69.7 bpm.  IMPRESSIONS - Severe obstructive sleep apnea occurred during this study (AHI = 32.8/h). - Moderate oxygen desaturation was noted during this study (Min O2 = 83%). Mean O2 saturattion 94%. - Patient snored.  DIAGNOSIS - Obstructive Sleep Apnea (G47.33)  RECOMMENDATIONS - Suggest CPAP titration sleep study or autopap. Other options would be based on clinical judgment. - Be careful with alcohol, sedatives and other CNS depressants that may worsen sleep apnea and disrupt normal sleep architecture. - Sleep hygiene should be reviewed to assess factors that may improve sleep quality. - Weight management and regular exercise should be initiated or continued.  [Electronically signed] 03/26/2021 11:11 AM  Jetty Duhamel MD, ABSM Diplomate, American Board of Sleep Medicine   NPI: [id number]                           Jetty Duhamel Diplomate, American Board of Sleep Medicine  ELECTRONICALLY SIGNED ON:  03/26/2021, 11:08 AM [facility] PH: [phone]   FX: [phone] ACCREDITED BY THE AMERICAN ACADEMY OF SLEEP MEDICINE

## 2021-04-05 ENCOUNTER — Other Ambulatory Visit: Payer: Self-pay

## 2021-04-05 ENCOUNTER — Ambulatory Visit (INDEPENDENT_AMBULATORY_CARE_PROVIDER_SITE_OTHER): Payer: BC Managed Care – PPO | Admitting: Allergy and Immunology

## 2021-04-05 ENCOUNTER — Encounter: Payer: Self-pay | Admitting: Allergy and Immunology

## 2021-04-05 VITALS — BP 130/82 | HR 78 | Temp 97.3°F | Resp 16 | Ht 72.0 in | Wt 223.0 lb

## 2021-04-05 DIAGNOSIS — J3089 Other allergic rhinitis: Secondary | ICD-10-CM | POA: Diagnosis not present

## 2021-04-05 MED ORDER — IPRATROPIUM BROMIDE 0.06 % NA SOLN: 1.0000 | Freq: Two times a day (BID) | NASAL | 3 refills | Status: DC

## 2021-04-05 MED ORDER — FLUTICASONE PROPIONATE 50 MCG/ACT NA SUSP: 2.0000 | Freq: Every day | NASAL | 3 refills | Status: DC

## 2021-04-05 NOTE — Progress Notes (Signed)
? ?[place] [facility] [employer] - [place] - [place] - [place] ? ? ?Follow-up Note ? ?Referring Provider: [name], MD ?Primary Provider: [name], MD ?Date of Office Visit: 04/05/2021 ? ?Subjective:  ? ?Joseph Fuentes (DOB: 11-15-1955) is a 66 y.o. male who [facility] in re-evaluation of the following: ? ?HPI: Joseph Fuentes returns to this clinic in evaluation of allergic rhinoconjunctivitis.  His last visit to this clinic was 23 March 2020. ? ?Overall he had an excellent year without the need for systemic steroid or antibiotic to treat any type of airway issue while he continues to use Flonase and nasal ipratropium on an intermittent basis.  He is very happy with the response that he is received while utilizing this form of treatment. ? ?He has received 4 Pfizer COVID vaccines and has received the flu vaccine. ? ?Allergies as of 04/05/2021   ? ?   Reactions  ? Gabapentin   ? Oxycodone Itching, Nausea And Vomiting, Nausea Only  ? Penicillins   ? Reaction occurred as an infant; parents told him never to take PCN  ? Terbinafine And Related Rash  ? All over body  ? ?  ? ?  ?Medication List  ? ? ?Ajovy 225 MG/1.5ML Soaj ?Generic drug: Fremanezumab-vfrm ?Inject 225 mg into the skin every 30 (thirty) days. ?  ?Allegra-D Allergy & Congestion 180-240 MG 24 hr tablet ?Generic drug: fexofenadine-pseudoephedrine ?Take 1 tablet by mouth daily. ?  ?ALPRAZolam 0.5 MG tablet ?Commonly known as: XANAX ?0.5-1 tablet by mouth as needed. ?  ?aspirin-acetaminophen-caffeine 250-250-65 MG tablet ?Commonly known as: EXCEDRIN MIGRAINE ?Take by mouth every 6 (six) hours as needed for headache. ?  ?desoximetasone 0.25 % cream ?Commonly known as: TOPICORT ?Apply 1 application topically daily as needed. ?  ?Dexilant 60 MG capsule ?Generic drug: dexlansoprazole ?Take 60 mg by mouth every other day. ?  ?docusate sodium 100 MG capsule ?Commonly known as: COLACE ?Take 100 mg by mouth daily. ?   ?eletriptan 40 MG tablet ?Commonly known as: Relpax ?Take 1 tablet (40 mg total) by mouth every 2 (two) hours as needed for migraine or headache. Maximum twice daily. ?  ?fluticasone 0.005 % ointment ?Commonly known as: CUTIVATE ?Apply 1 application topically 2 (two) times daily. ?  ?fluticasone 50 MCG/ACT nasal spray ?Commonly known as: FLONASE ?USE 1 TO 2 SPRAYS IN EACH  NOSTRIL ONCE DAILY ?  ?griseofulvin 250 MG tablet ?Commonly known as: GRIS-PEG ?Take 250 mg by mouth daily. ?  ?HYDROcodone-acetaminophen 10-325 MG tablet ?Commonly known as: NORCO ?Take 1 tablet by mouth as needed. ?  ?ibuprofen 600 MG tablet ?Commonly known as: ADVIL ?Take 600 mg by mouth as needed. ?  ?ipratropium 0.06 % nasal spray ?Commonly known as: ATROVENT ?USE 1 SPRAY NASALLY TWICE  DAILY ?  ?Naftin 2 % Crea ?Generic drug: Naftifine HCl ?as needed. ?  ?NASAL SALINE NA ?Place into the nose as needed. ?  ?Nurtec 75 MG Tbdp ?Generic drug: Rimegepant Sulfate ?Take 75 mg by mouth as needed (take 1 tablet at onset of headache, max is 1 tablet in 24 hours). ?  ?omeprazole 40 MG capsule ?Commonly known as: PRILOSEC ?Take 1-2 tablets by mouth at bedtime. ?  ?POTASSIUM PO ?Take 99 mg by mouth. ?  ?Synthroid 137 MCG tablet ?Generic drug: levothyroxine ?Take 274 mcg by mouth every morning. ?  ?tamsulosin 0.4 MG Caps capsule ?Commonly known as: FLOMAX ?1 capsule 30 minutes after the same meal each day ?  ?  topiramate 200 MG tablet ?Commonly known as: TOPAMAX ?TAKE 1 TABLET DAILY ?  ?triamcinolone ointment 0.1 % ?Commonly known as: KENALOG ?as needed. ?  ?Welchol 625 MG tablet ?Generic drug: colesevelam ?Take 3 tablets by mouth Twice daily. ?  ?zolpidem 12.5 MG CR tablet ?Commonly known as: AMBIEN CR ?Take 12.5 mg by mouth at bedtime. ?  ? ?Past Medical History:  ?Diagnosis Date  ? Allergic rhinitis due to dust   ? Anxiety   ? BPH (benign prostatic hyperplasia)   ? Bulging lumbar disc   ? Celiac disease/sprue   ? Concussion   ? Deviated septum   ?  Dyshidrotic eczema   ? Gastroesophageal reflux   ? Hypothyroidism   ? IBS (irritable bowel syndrome)   ? Insomnia   ? Low back pain   ? Migraines   ? Nephrolithiasis   ? Neuropathy   ? Onychomycosis   ? OSA (obstructive sleep apnea)   ? Osteopenia   ? Peripheral neuropathy   ? Rheumatic fever   ? 66 y.o.  ? Seborrheic keratosis   ? Shingles   ? ? ?Past Surgical History:  ?Procedure Laterality Date  ? arthroscopic knee surgery Left   ? COLONOSCOPY    ? SEPTOPLASTY    ? TENNIS ELBOW RELEASE/NIRSCHEL PROCEDURE    ? VASECTOMY    ? ? ?Review of systems negative except as noted in HPI / PMHx or noted below: ? ?Review of Systems  ?Constitutional: Negative.   ?HENT: Negative.    ?Eyes: Negative.   ?Respiratory: Negative.    ?Cardiovascular: Negative.   ?Gastrointestinal: Negative.   ?Genitourinary: Negative.   ?Musculoskeletal: Negative.   ?Skin: Negative.   ?Neurological: Negative.   ?Endo/Heme/Allergies: Negative.   ?Psychiatric/Behavioral: Negative.    ? ? ?Objective:  ? ?Vitals:  ? 04/05/21 1605  ?BP: 130/82  ?Pulse: 78  ?Resp: 16  ?Temp: (!) 97.3 ?F (36.3 ?C)  ?SpO2: 97%  ? ?Height: 6' (182.9 cm)  ?Weight: 223 lb (101.2 kg)  ? ?Physical Exam ?Constitutional:   ?   Appearance: He is not diaphoretic.  ?HENT:  ?   Head: Normocephalic.  ?   Right Ear: Tympanic membrane, ear canal and external ear normal.  ?   Left Ear: Tympanic membrane, ear canal and external ear normal.  ?   Nose: Nose normal. No mucosal edema or rhinorrhea.  ?   Mouth/Throat:  ?   Pharynx: Uvula midline. No oropharyngeal exudate.  ?Eyes:  ?   Conjunctiva/sclera: Conjunctivae normal.  ?Neck:  ?   Thyroid: No thyromegaly.  ?   Trachea: Trachea normal. No tracheal tenderness or tracheal deviation.  ?Cardiovascular:  ?   Rate and Rhythm: Normal rate and regular rhythm.  ?   Heart sounds: Normal heart sounds, S1 normal and S2 normal. No murmur heard. ?Pulmonary:  ?   Effort: No respiratory distress.  ?   Breath sounds: Normal breath sounds. No stridor. No  wheezing or rales.  ?Lymphadenopathy:  ?   Head:  ?   Right side of head: No tonsillar adenopathy.  ?   Left side of head: No tonsillar adenopathy.  ?   Cervical: No cervical adenopathy.  ?Skin: ?   Findings: No erythema or rash.  ?   Nails: There is no clubbing.  ?Neurological:  ?   Mental Status: He is alert.  ? ? ?Diagnostics: none   ? ?Assessment and Plan:  ? ?1. Other allergic rhinitis   ?  ?1. Continue Flonase and  nasal ipratropium (3 month + 3 refills) ? ?2. Return to clinic in 12 months or earlier if problem ? ?Joseph Fuentes is really doing very well on his plan and he has a very good understanding of his disease state and how his medications work and appropriate dosing of his medications and we will now see him back in this clinic in 1 year or earlier if there is a problem while he continues on this plan. ? ?Laurette Schimke, MD ?Allergy / Immunology ?[facility] ?

## 2021-04-05 NOTE — Patient Instructions (Signed)
?  1. Continue Flonase and nasal ipratropium (3 month + 3 refills)  2. Return to clinic in 12 months or earlier if problem   

## 2021-04-06 ENCOUNTER — Encounter: Payer: Self-pay | Admitting: Allergy and Immunology

## 2021-04-09 ENCOUNTER — Other Ambulatory Visit: Payer: Self-pay | Admitting: Allergy and Immunology

## 2021-04-19 DIAGNOSIS — B351 Tinea unguium: Secondary | ICD-10-CM | POA: Diagnosis not present

## 2021-04-19 DIAGNOSIS — L820 Inflamed seborrheic keratosis: Secondary | ICD-10-CM | POA: Diagnosis not present

## 2021-04-19 DIAGNOSIS — L821 Other seborrheic keratosis: Secondary | ICD-10-CM | POA: Diagnosis not present

## 2021-04-19 DIAGNOSIS — L298 Other pruritus: Secondary | ICD-10-CM | POA: Diagnosis not present

## 2021-04-19 DIAGNOSIS — D492 Neoplasm of unspecified behavior of bone, soft tissue, and skin: Secondary | ICD-10-CM | POA: Diagnosis not present

## 2021-04-19 DIAGNOSIS — D225 Melanocytic nevi of trunk: Secondary | ICD-10-CM | POA: Diagnosis not present

## 2021-04-26 ENCOUNTER — Other Ambulatory Visit: Payer: Self-pay

## 2021-04-26 ENCOUNTER — Other Ambulatory Visit: Payer: Self-pay | Admitting: Sports Medicine

## 2021-04-26 DIAGNOSIS — M545 Low back pain, unspecified: Secondary | ICD-10-CM

## 2021-04-26 NOTE — Progress Notes (Signed)
Pt called asking for another order placed for lumbar ESI. ?

## 2021-04-29 ENCOUNTER — Ambulatory Visit
Admission: RE | Admit: 2021-04-29 | Discharge: 2021-04-29 | Disposition: A | Discharge status: Home / Self Care | Payer: BC Managed Care – PPO | Source: Ambulatory Visit | Attending: Sports Medicine | Admitting: Sports Medicine

## 2021-04-29 DIAGNOSIS — M47817 Spondylosis without myelopathy or radiculopathy, lumbosacral region: Secondary | ICD-10-CM | POA: Diagnosis not present

## 2021-04-29 DIAGNOSIS — M545 Low back pain, unspecified: Secondary | ICD-10-CM

## 2021-04-29 MED ORDER — IOPAMIDOL (ISOVUE-M 200) INJECTION 41%
1.0000 mL | Freq: Once | INTRAMUSCULAR | Status: AC
  Administered 2021-04-29: 1 mL via EPIDURAL

## 2021-04-29 MED ORDER — DIAZEPAM 5 MG PO TABS
5.0000 mg | ORAL_TABLET | Freq: Once | ORAL | Status: AC
  Administered 2021-04-29: 5 mg via ORAL

## 2021-04-29 MED ORDER — METHYLPREDNISOLONE ACETATE 40 MG/ML INJ SUSP (RADIOLOG
80.0000 mg | Freq: Once | INTRAMUSCULAR | Status: AC
  Administered 2021-04-29: 80 mg via EPIDURAL

## 2021-04-29 NOTE — Discharge Instructions (Signed)

## 2021-05-02 ENCOUNTER — Ambulatory Visit (INDEPENDENT_AMBULATORY_CARE_PROVIDER_SITE_OTHER): Payer: BC Managed Care – PPO | Admitting: Family Medicine

## 2021-05-02 VITALS — BP 120/78 | Ht 72.0 in | Wt 223.0 lb

## 2021-05-02 DIAGNOSIS — M25552 Pain in left hip: Secondary | ICD-10-CM | POA: Diagnosis not present

## 2021-05-02 DIAGNOSIS — G8929 Other chronic pain: Secondary | ICD-10-CM

## 2021-05-02 DIAGNOSIS — M25551 Pain in right hip: Secondary | ICD-10-CM

## 2021-05-02 MED ORDER — METHYLPREDNISOLONE ACETATE 80 MG/ML IJ SUSP
80.0000 mg | Freq: Once | INTRAMUSCULAR | Status: AC
  Administered 2021-05-02: 80 mg via INTRA_ARTICULAR

## 2021-05-02 NOTE — Progress Notes (Signed)
PCP: [name], MD ? ?Subjective:  ? ?HPI: ?Patient is a 66 y.o. male here for bilateral hip pain. ? ?Patient has longstanding history of bilateral greater trochanteric pain syndrome. ?Pain in both hips, worse than left. ?Last injections about 6 months ago provided relief until recently. ?Worse lying on his sides. ?Marked the areas of greatest pain. ? ?Past Medical History:  ?Diagnosis Date  ? Allergic rhinitis due to dust   ? Anxiety   ? BPH (benign prostatic hyperplasia)   ? Bulging lumbar disc   ? Celiac disease/sprue   ? Concussion   ? Deviated septum   ? Dyshidrotic eczema   ? Gastroesophageal reflux   ? Hypothyroidism   ? IBS (irritable bowel syndrome)   ? Insomnia   ? Low back pain   ? Migraines   ? Nephrolithiasis   ? Neuropathy   ? Onychomycosis   ? OSA (obstructive sleep apnea)   ? Osteopenia   ? Peripheral neuropathy   ? Rheumatic fever   ? 66 y.o.  ? Seborrheic keratosis   ? Shingles   ? ? ?Current Outpatient Medications on File Prior to Visit  ?Medication Sig Dispense Refill  ? ALLEGRA-D ALLERGY & CONGESTION 180-240 MG 24 hr tablet Take 1 tablet by mouth daily.   3  ? ALPRAZolam (XANAX) 0.5 MG tablet 0.5-1 tablet by mouth as needed.    ? aspirin-acetaminophen-caffeine (EXCEDRIN MIGRAINE) 250-250-65 MG tablet Take by mouth every 6 (six) hours as needed for headache.    ? desoximetasone (TOPICORT) 0.25 % cream Apply 1 application topically daily as needed.    ? DEXILANT 60 MG capsule Take 60 mg by mouth every other day.    ? docusate sodium (COLACE) 100 MG capsule Take 100 mg by mouth daily.     ? eletriptan (RELPAX) 40 MG tablet Take 1 tablet (40 mg total) by mouth every 2 (two) hours as needed for migraine or headache. Maximum twice daily. 42 tablet 3  ? fluticasone (CUTIVATE) 0.005 % ointment Apply 1 application topically 2 (two) times daily.    ? fluticasone (FLONASE) 50 MCG/ACT nasal spray Place 2 sprays into both nostrils daily. 48 g 3  ? Fremanezumab-vfrm (AJOVY) 225 MG/1.5ML SOAJ Inject 225 mg  into the skin every 30 (thirty) days. 4 mL 0  ? griseofulvin (GRIS-PEG) 250 MG tablet Take 250 mg by mouth daily.    ? HYDROcodone-acetaminophen (NORCO) 10-325 MG tablet Take 1 tablet by mouth as needed.     ? ibuprofen (ADVIL,MOTRIN) 600 MG tablet Take 600 mg by mouth as needed.    ? ipratropium (ATROVENT) 0.06 % nasal spray Place 1 spray into both nostrils 2 (two) times daily. USE 1 SPRAY NASALLY TWICE  DAILY 45 mL 3  ? NAFTIN 2 % CREA as needed.    ? NASAL SALINE NA Place into the nose as needed.    ? omeprazole (PRILOSEC) 40 MG capsule Take 1-2 tablets by mouth at bedtime.     ? POTASSIUM PO Take 99 mg by mouth.     ? Rimegepant Sulfate (NURTEC) 75 MG TBDP Take 75 mg by mouth as needed (take 1 tablet at onset of headache, max is 1 tablet in 24 hours). 12 tablet 11  ? SYNTHROID 137 MCG tablet Take 274 mcg by mouth every morning.    ? tamsulosin (FLOMAX) 0.4 MG CAPS capsule 1 capsule 30 minutes after the same meal each day    ? topiramate (TOPAMAX) 200 MG tablet TAKE 1 TABLET DAILY  90 tablet 4  ? triamcinolone ointment (KENALOG) 0.1 % as needed.    ? WELCHOL 625 MG tablet Take 3 tablets by mouth Twice daily.    ? zolpidem (AMBIEN CR) 12.5 MG CR tablet Take 12.5 mg by mouth at bedtime.     ? ?No current facility-administered medications on file prior to visit.  ? ? ?Past Surgical History:  ?Procedure Laterality Date  ? arthroscopic knee surgery Left   ? COLONOSCOPY    ? SEPTOPLASTY    ? TENNIS ELBOW RELEASE/NIRSCHEL PROCEDURE    ? VASECTOMY    ? ? ?Allergies  ?Allergen Reactions  ? Gabapentin   ? Oxycodone Itching, Nausea And Vomiting and Nausea Only  ? Penicillins   ?  Reaction occurred as an infant; parents told him never to take PCN  ? Terbinafine And Related Rash  ?  All over body  ? ? ?BP 120/78   Ht 6' (1.829 m)   Wt 223 lb (101.2 kg)   BMI 30.24 kg/m?  ? ? ?  10/28/2019  ?  3:55 PM 12/23/2019  ?  3:44 PM 11/02/2020  ?  3:24 PM 12/09/2020  ?  2:01 PM  ?[facility]  ?Frequency of  aerobic exercise (# of days/week) 2 2 3  0  ?Average time in minutes 45 45 0 0  ?Frequency of strengthening activities (# of days/week) 0 0 0 0  ? ? ?   ? View : No data to display.  ?  ?  ?  ? ? ?    ?Objective:  ?Physical Exam: ? ?Gen: NAD, comfortable in exam room ? ?Bilateral hips: ?No deformity. ?FROM with 5/5 strength including hip abduction. ?Tenderness to palpation greater trochanters.  No other tenderness. ?NVI distally. ?Negative logroll ?Negative faber, fadir, and piriformis stretches. ?  ?Assessment & Plan:  ?1. Bilateral hip pain - 2/2 greater trochanteric pain syndrome.  He has had several injections to date which provide temporary relief for several months.  Repeated these today.  Stressed importance of stretches, hip abduction/external rotation strengthening. ? ?After informed written consent timeout was performed, patient was lying on left side on exam table.  Area overlying right trochanteric bursa prepped with alcohol swab then injected with 6:1 lidocaine: depomedrol 80mg .  Patient tolerated procedure well without immediate complications. ? ?After informed written consent timeout was performed, patient was lying on right side on exam table.  Area overlying left trochanteric bursa prepped with alcohol swab then injected with 6:1 lidocaine: depomedrol 80mg .  Patient tolerated procedure well without immediate complications. ? ?

## 2021-05-03 ENCOUNTER — Encounter: Payer: Self-pay | Admitting: Neurology

## 2021-05-03 MED ORDER — AJOVY 225 MG/1.5ML ~~LOC~~ SOAJ: 225.0000 mg | SUBCUTANEOUS | 0 refills | Status: DC

## 2021-05-09 MED ORDER — AJOVY 225 MG/1.5ML ~~LOC~~ SOAJ: 225.0000 mg | SUBCUTANEOUS | 0 refills | Status: DC

## 2021-05-09 NOTE — Addendum Note (Signed)
Addended by: Gildardo Griffes on: 05/09/2021 09:31 AM ? ? Modules accepted: Orders ? ?

## 2021-05-09 NOTE — Telephone Encounter (Signed)
[id number] for 90 day refill of Ajovy d/t cost. Rx sent x 1.  ?

## 2021-05-12 ENCOUNTER — Ambulatory Visit: Payer: BC Managed Care – PPO | Admitting: Neurology

## 2021-05-19 ENCOUNTER — Ambulatory Visit: Payer: BC Managed Care – PPO | Admitting: Neurology

## 2021-05-27 DIAGNOSIS — G4733 Obstructive sleep apnea (adult) (pediatric): Secondary | ICD-10-CM | POA: Diagnosis not present

## 2021-06-09 ENCOUNTER — Telehealth: Payer: Self-pay | Admitting: Allergy and Immunology

## 2021-06-09 DIAGNOSIS — G43909 Migraine, unspecified, not intractable, without status migrainosus: Secondary | ICD-10-CM | POA: Diagnosis not present

## 2021-06-09 DIAGNOSIS — Z125 Encounter for screening for malignant neoplasm of prostate: Secondary | ICD-10-CM | POA: Diagnosis not present

## 2021-06-09 DIAGNOSIS — L301 Dyshidrosis [pompholyx]: Secondary | ICD-10-CM | POA: Diagnosis not present

## 2021-06-09 DIAGNOSIS — E039 Hypothyroidism, unspecified: Secondary | ICD-10-CM | POA: Diagnosis not present

## 2021-06-09 DIAGNOSIS — Z13220 Encounter for screening for lipoid disorders: Secondary | ICD-10-CM | POA: Diagnosis not present

## 2021-06-09 DIAGNOSIS — Z0000 Encounter for general adult medical examination without abnormal findings: Secondary | ICD-10-CM | POA: Diagnosis not present

## 2021-06-09 DIAGNOSIS — E782 Mixed hyperlipidemia: Secondary | ICD-10-CM | POA: Diagnosis not present

## 2021-06-09 NOTE — Telephone Encounter (Signed)
Patient called and needs to have a refill done on Atrovent nasal spray, and would like to have more than 1 refill for 90 days. He said that he is on 2 nasal spray and always on them and would like to have for refills for the year. [facility] [phone]. ?

## 2021-06-10 MED ORDER — FLUTICASONE PROPIONATE 50 MCG/ACT NA SUSP: 2.0000 | Freq: Every day | NASAL | 3 refills | Status: DC

## 2021-06-10 MED ORDER — IPRATROPIUM BROMIDE 0.06 % NA SOLN: NASAL | 3 refills | Status: DC

## 2021-06-10 NOTE — Telephone Encounter (Signed)
Refills for both nasal sprays were [facility].  ?

## 2021-06-16 ENCOUNTER — Ambulatory Visit: Payer: BC Managed Care – PPO | Admitting: Neurology

## 2021-06-21 ENCOUNTER — Telehealth (INDEPENDENT_AMBULATORY_CARE_PROVIDER_SITE_OTHER): Payer: Self-pay | Admitting: Neurology

## 2021-06-21 DIAGNOSIS — G629 Polyneuropathy, unspecified: Secondary | ICD-10-CM

## 2021-06-21 DIAGNOSIS — G43709 Chronic migraine without aura, not intractable, without status migrainosus: Secondary | ICD-10-CM

## 2021-06-21 DIAGNOSIS — G43909 Migraine, unspecified, not intractable, without status migrainosus: Secondary | ICD-10-CM

## 2021-06-21 NOTE — Progress Notes (Unsigned)
[facility]    Provider:  Dr Lucia Gaskins Referring Provider: [name], MD Primary Care Physician:  [name], MD  CC:  Numbness in feet, migraines  Virtual Visit via Telephone Note  I connected with Joseph Fuentes on 06/21/2021 at  4:00 PM EDT by telephone and verified that I am speaking with the correct person using two identifiers.  Location: Patient: home Provider: office   I discussed the limitations, risks, security and privacy concerns of performing an evaluation and management service by telephone and the availability of in person appointments. I also discussed with the patient that there may be a patient responsible charge related to this service. The patient expressed understanding and agreed to proceed.  Follow Up Instructions:    I discussed the assessment and treatment plan with the patient. The patient was provided an opportunity to ask questions and all were answered. The patient agreed with the plan and demonstrated an understanding of the instructions.   The patient was advised to call back or seek an in-person evaluation if the symptoms worsen or if the condition fails to improve as anticipated.  I provided less than 5 minutes of non-face-to-face time during this encounter.   [name], MD   06/22/2021: could not connect via video, we spoke on the phone briefly, [place] < 5 minutes refill meds see in one year.  05/12/2020: He was miserable with the headaches. He started ajovy and stopped chocolate and he fels better. The nurtec helps a little. 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 We discussed other options for acute management, we can try Ubrelvy  Patient complains of symptoms per HPI as well as the following symptoms: headache much improved, sleep apnea compliant on cpap. Pertinent negatives and positives per HPI. All others negative   [name]: 02/03/20   Joseph Fuentes is a very pleasant 66 year old male with long history of migraine headaches.  Previously, on Topamax 200 mg daily, he reported memory loss.  In the past, he started Emgality, for 3 months, weaned dose of Topamax, Emgality was not helpful, ultimately Topamax was increased back to 200 mg twice a day.  He was going to try Aimovig, never did.  For quite a while, his headaches have been fairly well controlled.  However, in the past 2 to 3 months, has had increase in frequency of headache, almost daily.  He got into a cycle of rebound headache, taking frequent Relpax, ibuprofen, Excedrin.  Migraines, having typical presentation, starts at the right cheek, radiates to the right temple, can be associated with right eye drooping, with migraine features.  The only change, is the last several months, he may have 2-3 migraines in 1 day.  As he has started to break away from the rebound cycle, headaches have improved.  He has not had a migraine in the last 2 days.  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 Currently remains on Topamax 200 mg daily.  Takes hydrocodone for chronic back pain.  Presents today for evaluation accompanied by his wife. Headaches have been so bad, he had to walk off the golf course. Not interested in Botox.   11/13/2018; Patient is here as a new request from Dr. Renato Gails for new symptoms including numbness and pain in feet for years.He has a hx of uncontrolled migraines, morbid obesity, OSA on cpap, hypothyroidism, insomnia, peripheral neuropathy.  Patient was seen here  in the past for migraines, he reduced Topamax due to concern for memory loss, he was started on Emgality which did not help and he was switched to Aimovig at last appointment and finally back to topamax and doing well.  He was referred  in the past for formal neurocognitive testing  and 1 of the neuropsychologist here in town declined the referral.   MRI Brain in November 2019  was largely unremarkable, normal for age.  His wife is here and provides much information. He has had feet feeling cold and had a test for circulation which was good. He wears socks when he goes to bed. Not cold to the touch but feels cold. Started 2 years ago. No inciting events. He is having new sharp pain shppting down his leg searing to his feet lasting 20-30 seconds, when sleeping, coming from his hips. He can't sleep on his back or stomach due to gerd so he sleeps on his sides and he feels pain in the lower legs on the top of the foot possibly coming from the hips however unclear but the hips do hurt. Hips will hurt when he walks long distance. He had shots in the hips and helped with the hips pain. He plays golf and his hips are ok its just when he lays down on his sides after playing golf.   Interval history 11/21/2017: New referral from Dr. [name]. He has a hx of uncontrolled migraines, morbid obesity, OSA on cpap, hypothyroidism, insomnia, peripheral neuropathy.[name] He is on Topamax  daily. Also on xanax, ambien. Here with his wife who also provides information. The biggest thing wife has noticed is that  he will drive down the road and then forgets where he is going for 4-5 seconds, panics. He is having trouble remembering how to do his job that he has been doing for 30 years. He doesn't remember directions which is something he has always been very good at. He went to [place] and after he got there he didn't feel comfortable in a congested area. He sometimes doesn't know where he is even in familiar places in the car.  HE pays the bills and manages the finances. Dad is in memory care with severe dementia possibly alzheimers, started displaying in early 51s. Maternal grandmother alzheimers in early 2s. He is compliant but hasn't had it checked for years, he is very tired and never feels refreshed. He will bring in his chip so we can download only getting 5 hours sleep. No other focal  neurologic deficits, associated symptoms, inciting events or modifiable factors.  Previous visit:  Andres Vest is a 66 y.o. male here as a referral from Dr. [name] for migraines. PMHx migraines, HLD, IBS, hypothyroidism, insomnia, peripheral neuropathy. He has had migraines all his life. He started having migraines at the age of 5 or 6, a celiac diet helped when he was young. As he got older he didn't adhere to the diet. His baseline was 3-4 a month with exacerbations of 3 a week. After losing weight 30 pounds he didn't have many headaches during that timeframe. The last 6 months his migraines have been worse, no inciting events except when his diet changed. Migraines start in the right cheek and radiates to the back of the right eye and temple and whole side of the right face. Relpax helps if he catches it. Used to be pounding now hammering and he can;t even more, the right eye droops, pressure and severe pain, nausea but no  vomiting. Ice pack helps. No auras or flashing lights. No prodrome. They can occur in the mid morning or later in the evening. He has insomnia at least one night a week. He takes [place] every night and still some nights.he has headaches every day and most are migainous. He takes excedrin 6x a month, relpax 15x a month and ibuprofen 10x a month. No other focal neurologic deficits, associated symptoms, inciting events or modifiable factors.  Migraine medications tried: Topiramate, relpax  Reviewed notes, labs and imaging from outside physicians, which showed:  Reviewed primary care notes: He asked for 180 pills of Relpax, insurance only willing to dispense 36 tabs. Plan usually does not cover more than 12 tabs per month and will have to pay out of [place] for anything over that. Then patient said that he doesn't really need 180 Relpax every 90 days however after going for urine half without significant migraines his headaches have returned and now he's backed needing at least 72 tablets  every 90 days. 72 tablets of Relpax were sent to the patient's pharmacy. He is on topiramate 150 mg at night, also Xanax, ibuprofen 800 mg 3 times a day, Excedrin extra strength when necessary per med list. He is maintained on Topamax and back in July 2017 he was saying he was having 3-4 migraines per month. He uses Relpax with good response. He also has insomnia and currently taking Ambien. I reviewed exam which included normal general, head, eyes, ears, nose, throat, neck, chest, lungs, heart, extremities, skin.   CMP normal 03/24/2015 BUN 22 and creatinine 1.19, CBC 02/15/2015 normal  CT maxillofacial 04/23/2008 personally reviewed report:  Comparison: None   Findings: Slight inflammatory enlargement right inferior nasal turbinates seen.  Minimal 85mm and 3 mm posterior nasal septal mucosal inflammatory polyps are seen.  9 mm mucous retention cyst is seen at the anteromedial inferior left maxillary antrum with paranasal sinuses otherwise clear.  Bilateral infundibuli appear widely patent.  No other significant abnormalities seen. 9 mm mucous retention cyst is seen at the anterior inferior left maxillary antrum (axial image 37).  Visualized paranasal sinuses are otherwise clear.  Middle ear cavities, mastoid air cells appear clear.   IMPRESSION:   1.  Minimal rhinitis findings and 9 mm left maxillary mucous retention cyst as described. 2.  Clear paranasal sinuses. 3.  No additional significant abnormality  Review of Systems: Patient complains of symptoms per HPI as well as the following symptoms: Feeling cold, allergies, runny nose, headache, insomnia. Pertinent negatives per HPI. All others negative.   Social History   Socioeconomic History   Marital status: Married    Spouse name: Not on file   Number of children: 2   Years of education: 14   Highest education level: Associate degree: academic program  Occupational History   Occupation: printing  Tobacco Use   Smoking  status: Former    Types: Cigarettes    Quit date: 06/28/2006    Years since quitting: 14.9   Smokeless tobacco: Never  Vaping Use   Vaping Use: Never used  Substance and Sexual Activity   Alcohol use: Yes    Alcohol/week: 2.0 standard drinks    Types: 2 Cans of beer per week    Comment: social, during bowling season   Drug use: Not Currently    Comment: none in decades    Sexual activity: Not on file  Other Topics Concern   Not on file  Social History Narrative   Lives at home with  his wife   Left handed   2-3 Caffeine daily    Social Determinants of Health   Financial Resource Strain: Not on file  Food Insecurity: Not on file  Transportation Needs: Not on file  Physical Activity: Not on file  Stress: Not on file  Social Connections: Not on file  Intimate Partner Violence: Not on file    Family History  Problem Relation Age of Onset   Alzheimer's disease Father        severe dementia   High Cholesterol Father    Gout Father    Other Father        back problems   Migraines Other        strong maternal fam hx    High blood pressure Mother    Lung cancer Mother        [id number]   Neuropathy Mother    Gout Mother     Past Medical History:  Diagnosis Date   Allergic rhinitis due to dust    Anxiety    BPH (benign prostatic hyperplasia)    Bulging lumbar disc    Celiac disease/sprue    Concussion    Deviated septum    Dyshidrotic eczema    Gastroesophageal reflux    Hypothyroidism    IBS (irritable bowel syndrome)    Insomnia    Low back pain    Migraines    Nephrolithiasis    Neuropathy    Onychomycosis    OSA (obstructive sleep apnea)    Osteopenia    Peripheral neuropathy    Rheumatic fever    66 y.o.   Seborrheic keratosis    Shingles     Past Surgical History:  Procedure Laterality Date   arthroscopic knee surgery Left    COLONOSCOPY     SEPTOPLASTY     TENNIS ELBOW RELEASE/NIRSCHEL PROCEDURE     VASECTOMY      Current Outpatient  Medications  Medication Sig Dispense Refill   ALLEGRA-D ALLERGY & CONGESTION 180-240 MG 24 hr tablet Take 1 tablet by mouth daily.   3   ALPRAZolam (XANAX) 0.5 MG tablet 0.5-1 tablet by mouth as needed.     aspirin-acetaminophen-caffeine (EXCEDRIN MIGRAINE) 250-250-65 MG tablet Take by mouth every 6 (six) hours as needed for headache.     desoximetasone (TOPICORT) 0.25 % cream Apply 1 application topically daily as needed.     DEXILANT 60 MG capsule Take 60 mg by mouth every other day.     docusate sodium (COLACE) 100 MG capsule Take 100 mg by mouth daily.      eletriptan (RELPAX) 40 MG tablet Take 1 tablet (40 mg total) by mouth every 2 (two) hours as needed for migraine or headache. Maximum twice daily. 42 tablet 3   fluticasone (CUTIVATE) 0.005 % ointment Apply 1 application topically 2 (two) times daily.     fluticasone (FLONASE) 50 MCG/ACT nasal spray Place 2 sprays into both nostrils daily. 48 g 3   Fremanezumab-vfrm (AJOVY) 225 MG/1.5ML SOAJ Inject 225 mg into the skin every 30 (thirty) days. 4.5 mL 3   HYDROcodone-acetaminophen (NORCO) 10-325 MG tablet Take 1 tablet by mouth as needed.      ibuprofen (ADVIL,MOTRIN) 600 MG tablet Take 600 mg by mouth as needed.     ipratropium (ATROVENT) 0.06 % nasal spray 1 spray in each nostril 2 times daily 45 mL 3   NAFTIN 2 % CREA as needed.     NASAL SALINE NA Place into the nose  as needed.     omeprazole (PRILOSEC) 40 MG capsule Take 1-2 tablets by mouth at bedtime.      POTASSIUM PO Take 99 mg by mouth.      SYNTHROID 137 MCG tablet Take 274 mcg by mouth every morning.     tamsulosin (FLOMAX) 0.4 MG CAPS capsule 1 capsule 30 minutes after the same meal each day     topiramate (TOPAMAX) 200 MG tablet Take 1 tablet (200 mg total) by mouth daily. 90 tablet 4   triamcinolone ointment (KENALOG) 0.1 % as needed.     WELCHOL 625 MG tablet Take 3 tablets by mouth Twice daily.     zolpidem (AMBIEN CR) 12.5 MG CR tablet Take 12.5 mg by mouth at bedtime.       No current facility-administered medications for this visit.    Allergies as of 06/21/2021 - Review Complete 05/02/2021  Allergen Reaction Noted   Gabapentin  11/02/2020   Oxycodone Itching, Nausea And Vomiting, and Nausea Only 11/02/2020   Penicillins     Terbinafine and related Rash 06/04/2018    Vitals: There were no vitals taken for this visit. Last Weight:  Wt Readings from Last 1 Encounters:  05/02/21 223 lb (101.2 kg)   Last Height:   Ht Readings from Last 1 Encounters:  05/02/21 6' (1.829 m)   Speech:    Speech is normal; fluent and spontaneous with normal comprehension.  Cognition:    The patient is oriented to person, place, and time;     recent and remote memory intact;     language fluent;     normal attention, concentration,     fund of knowledge   Assessment/Plan:  66 y.o. male here as a referral from Dr. Doristine Counter for peripheral neuropathy and headache/migraines. as a past medical history of Allergic rhinitis due to dust, Anxiety, BPH (benign prostatic hyperplasia), Bulging lumbar disc, Celiac disease/sprue, Concussion, Deviated septum, Dyshidrotic eczema, Gastroesophageal reflux, Hypothyroidism, IBS (irritable bowel syndrome), Insomnia, Low back pain, Migraines, Nephrolithiasis, Neuropathy, Onychomycosis, OSA (obstructive sleep apnea), Osteopenia, Peripheral neuropathy, Rheumatic fever, Seborrheic keratosis, and Shingles.   Doing well, refill medications  Meds ordered this encounter  Medications   topiramate (TOPAMAX) 200 MG tablet    Sig: Take 1 tablet (200 mg total) by mouth daily.    Dispense:  90 tablet    Refill:  4   Fremanezumab-vfrm (AJOVY) 225 MG/1.5ML SOAJ    Sig: Inject 225 mg into the skin every 30 (thirty) days.    Dispense:  4.5 mL    Refill:  3   eletriptan (RELPAX) 40 MG tablet    Sig: Take 1 tablet (40 mg total) by mouth every 2 (two) hours as needed for migraine or headache. Maximum twice daily.    Dispense:  42 tablet    Refill:   3    42 tablets for 90 days. This is a 25-month supply.    Cc: Dr. Russella Dar, MD  [facility] [address] [place], [place] [address]  Phone [phone] Fax [phone]

## 2021-06-22 ENCOUNTER — Telehealth: Payer: Self-pay | Admitting: Neurology

## 2021-06-22 MED ORDER — TOPIRAMATE 200 MG PO TABS: 200.0000 mg | ORAL_TABLET | Freq: Every day | ORAL | 4 refills | Status: DC

## 2021-06-22 MED ORDER — AJOVY 225 MG/1.5ML ~~LOC~~ SOAJ: 225.0000 mg | SUBCUTANEOUS | 3 refills | Status: DC

## 2021-06-22 MED ORDER — ELETRIPTAN HYDROBROMIDE 40 MG PO TABS: 40.0000 mg | ORAL_TABLET | ORAL | 3 refills | Status: DC | PRN

## 2021-06-22 NOTE — Telephone Encounter (Signed)
Jillian, would you mind scheduling patient for follow up in one year with NP please thank you

## 2021-06-23 ENCOUNTER — Encounter: Payer: Self-pay | Admitting: Neurology

## 2021-06-26 DIAGNOSIS — G4733 Obstructive sleep apnea (adult) (pediatric): Secondary | ICD-10-CM | POA: Diagnosis not present

## 2021-07-27 DIAGNOSIS — G4733 Obstructive sleep apnea (adult) (pediatric): Secondary | ICD-10-CM | POA: Diagnosis not present

## 2021-08-01 ENCOUNTER — Other Ambulatory Visit: Payer: Self-pay | Admitting: Neurology

## 2021-08-01 DIAGNOSIS — G43709 Chronic migraine without aura, not intractable, without status migrainosus: Secondary | ICD-10-CM

## 2021-08-10 DIAGNOSIS — R311 Benign essential microscopic hematuria: Secondary | ICD-10-CM | POA: Diagnosis not present

## 2021-08-10 DIAGNOSIS — R351 Nocturia: Secondary | ICD-10-CM | POA: Diagnosis not present

## 2021-08-10 DIAGNOSIS — N401 Enlarged prostate with lower urinary tract symptoms: Secondary | ICD-10-CM | POA: Diagnosis not present

## 2021-08-26 DIAGNOSIS — G4733 Obstructive sleep apnea (adult) (pediatric): Secondary | ICD-10-CM | POA: Diagnosis not present

## 2021-08-30 ENCOUNTER — Encounter: Payer: Self-pay | Admitting: Neurology

## 2021-09-05 DIAGNOSIS — H53143 Visual discomfort, bilateral: Secondary | ICD-10-CM | POA: Diagnosis not present

## 2021-09-20 DIAGNOSIS — M2550 Pain in unspecified joint: Secondary | ICD-10-CM | POA: Diagnosis not present

## 2021-09-20 DIAGNOSIS — E039 Hypothyroidism, unspecified: Secondary | ICD-10-CM | POA: Diagnosis not present

## 2021-09-20 DIAGNOSIS — E063 Autoimmune thyroiditis: Secondary | ICD-10-CM | POA: Diagnosis not present

## 2021-09-20 DIAGNOSIS — D7589 Other specified diseases of blood and blood-forming organs: Secondary | ICD-10-CM | POA: Diagnosis not present

## 2021-09-20 DIAGNOSIS — Z79899 Other long term (current) drug therapy: Secondary | ICD-10-CM | POA: Diagnosis not present

## 2021-09-20 DIAGNOSIS — Z8719 Personal history of other diseases of the digestive system: Secondary | ICD-10-CM | POA: Diagnosis not present

## 2021-09-20 DIAGNOSIS — R209 Unspecified disturbances of skin sensation: Secondary | ICD-10-CM | POA: Diagnosis not present

## 2021-09-26 DIAGNOSIS — G4733 Obstructive sleep apnea (adult) (pediatric): Secondary | ICD-10-CM | POA: Diagnosis not present

## 2021-09-29 DIAGNOSIS — G4733 Obstructive sleep apnea (adult) (pediatric): Secondary | ICD-10-CM | POA: Diagnosis not present

## 2021-10-04 ENCOUNTER — Ambulatory Visit (INDEPENDENT_AMBULATORY_CARE_PROVIDER_SITE_OTHER): Payer: BC Managed Care – PPO | Admitting: Sports Medicine

## 2021-10-04 ENCOUNTER — Ambulatory Visit
Admission: RE | Admit: 2021-10-04 | Discharge: 2021-10-04 | Disposition: A | Discharge status: Home / Self Care | Payer: BC Managed Care – PPO | Source: Ambulatory Visit | Attending: Sports Medicine | Admitting: Sports Medicine

## 2021-10-04 VITALS — BP 120/68 | Ht 72.0 in | Wt 228.0 lb

## 2021-10-04 DIAGNOSIS — M7061 Trochanteric bursitis, right hip: Secondary | ICD-10-CM | POA: Diagnosis not present

## 2021-10-04 DIAGNOSIS — M25552 Pain in left hip: Secondary | ICD-10-CM

## 2021-10-04 DIAGNOSIS — M7062 Trochanteric bursitis, left hip: Secondary | ICD-10-CM | POA: Diagnosis not present

## 2021-10-04 DIAGNOSIS — M25551 Pain in right hip: Secondary | ICD-10-CM

## 2021-10-04 MED ORDER — METHYLPREDNISOLONE ACETATE 40 MG/ML IJ SUSP
40.0000 mg | Freq: Once | INTRAMUSCULAR | Status: AC
  Administered 2021-10-04: 40 mg via INTRA_ARTICULAR

## 2021-10-05 NOTE — Progress Notes (Signed)
   Subjective:    Patient ID: Joseph Fuentes, male    DOB: March 04, 1955, 66 y.o.   MRN: [id number]  HPI chief complaint: Bilateral hip pain  Joseph Fuentes presents today requesting repeat bilateral hip injections.  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  He does receive benefit from injections.  Last injections were in April of this year.  Although he has done some home exercises he has not done formal physical therapy for this.  He denies any groin pain.  Pain is all along the lateral hips and worse with sleeping on his side at night.  No recent trauma.    Review of Systems As above    Objective:   Physical Exam  Well-developed, well-nourished.  No acute distress  Examination of both hips show smooth painless hip range of motion with a negative logroll.  He is tender to palpation directly over the greater trochanter bilaterally.  Negative Trendelenburg.  Neurovascularly intact distally.  X-rays of both hips show mild to moderate osteoarthritis but otherwise unremarkable      Assessment & Plan:   Chronic bilateral hip pain secondary to greater trochanteric pain syndrome-rule out gluteus medius tendon tears  Each of the patient's greater trochanteric areas were injected today with cortisone.  This was accomplished atraumatically under sterile technique.  He tolerates this without difficulty.  This is his third injection in these hips in the past year.  I have advised against future injections until we get an MRI to rule out gluteus medius or gluteus minimus tendon tears.  Phone follow-up with those results once available.  I would also like for Odai to start formal physical therapy with Ellamae Sia.  This note was dictated using Dragon naturally speaking software and may contain errors in syntax, spelling, or content which have not been identified prior to signing this note.

## 2021-10-20 ENCOUNTER — Ambulatory Visit
Admission: RE | Admit: 2021-10-20 | Discharge: 2021-10-20 | Disposition: A | Discharge status: Home / Self Care | Payer: BC Managed Care – PPO | Source: Ambulatory Visit | Attending: Sports Medicine | Admitting: Sports Medicine

## 2021-10-20 DIAGNOSIS — M25552 Pain in left hip: Secondary | ICD-10-CM | POA: Diagnosis not present

## 2021-10-20 DIAGNOSIS — M25551 Pain in right hip: Secondary | ICD-10-CM | POA: Diagnosis not present

## 2021-10-26 ENCOUNTER — Encounter: Payer: Self-pay | Admitting: Sports Medicine

## 2021-10-27 DIAGNOSIS — M25551 Pain in right hip: Secondary | ICD-10-CM | POA: Diagnosis not present

## 2021-10-27 DIAGNOSIS — M25552 Pain in left hip: Secondary | ICD-10-CM | POA: Diagnosis not present

## 2021-10-27 DIAGNOSIS — G4733 Obstructive sleep apnea (adult) (pediatric): Secondary | ICD-10-CM | POA: Diagnosis not present

## 2021-10-29 DIAGNOSIS — G4733 Obstructive sleep apnea (adult) (pediatric): Secondary | ICD-10-CM | POA: Diagnosis not present

## 2021-11-02 DIAGNOSIS — M25551 Pain in right hip: Secondary | ICD-10-CM | POA: Diagnosis not present

## 2021-11-02 DIAGNOSIS — M25552 Pain in left hip: Secondary | ICD-10-CM | POA: Diagnosis not present

## 2021-11-08 DIAGNOSIS — E039 Hypothyroidism, unspecified: Secondary | ICD-10-CM | POA: Diagnosis not present

## 2021-11-15 ENCOUNTER — Telehealth (INDEPENDENT_AMBULATORY_CARE_PROVIDER_SITE_OTHER): Payer: BC Managed Care – PPO | Admitting: Sports Medicine

## 2021-11-15 VITALS — BP 127/87 | Ht 72.0 in

## 2021-11-15 DIAGNOSIS — M25551 Pain in right hip: Secondary | ICD-10-CM | POA: Diagnosis not present

## 2021-11-15 DIAGNOSIS — M25552 Pain in left hip: Secondary | ICD-10-CM

## 2021-11-16 NOTE — Progress Notes (Signed)
Patient ID: [facility], male   DOB: 12/18/1955, 66 y.o.   MRN: [id number]  Padraig presents today for follow-up.  Recent MRI of his pelvis showed some inflammation of the gluteus medius tendons.  Most recent cortisone injections were not very beneficial however he has been able to start physical therapy.  He has attended 2 sessions with [facility].  Unfortunately, cost will prevent him from continuing with formal PT.  However, he has an excellent understanding of his home exercises and explained to him that that is the most important part of this.  He understands the importance of core and hip strengthening for this ongoing lateral hip pain.  He states that 800 mg of ibuprofen is helpful when needed.  He is also noticed that softening his bed has made a difference.  At this point we will leave things open-ended for him to follow-up with me as needed.  This note was dictated using Dragon naturally speaking software and may contain errors in syntax, spelling, or content which have not been identified prior to signing this note.

## 2021-11-26 DIAGNOSIS — G4733 Obstructive sleep apnea (adult) (pediatric): Secondary | ICD-10-CM | POA: Diagnosis not present

## 2021-11-29 DIAGNOSIS — R3912 Poor urinary stream: Secondary | ICD-10-CM | POA: Diagnosis not present

## 2021-11-29 DIAGNOSIS — G4733 Obstructive sleep apnea (adult) (pediatric): Secondary | ICD-10-CM | POA: Diagnosis not present

## 2021-11-29 DIAGNOSIS — N5201 Erectile dysfunction due to arterial insufficiency: Secondary | ICD-10-CM | POA: Diagnosis not present

## 2021-11-29 DIAGNOSIS — N200 Calculus of kidney: Secondary | ICD-10-CM | POA: Diagnosis not present

## 2021-11-29 DIAGNOSIS — N401 Enlarged prostate with lower urinary tract symptoms: Secondary | ICD-10-CM | POA: Diagnosis not present

## 2021-12-27 ENCOUNTER — Encounter: Payer: Self-pay | Admitting: Neurology

## 2021-12-27 ENCOUNTER — Other Ambulatory Visit: Payer: Self-pay

## 2021-12-27 ENCOUNTER — Other Ambulatory Visit: Payer: Self-pay | Admitting: Sports Medicine

## 2021-12-27 DIAGNOSIS — G4733 Obstructive sleep apnea (adult) (pediatric): Secondary | ICD-10-CM | POA: Diagnosis not present

## 2021-12-27 DIAGNOSIS — G8929 Other chronic pain: Secondary | ICD-10-CM

## 2021-12-27 DIAGNOSIS — M25552 Pain in left hip: Secondary | ICD-10-CM

## 2021-12-27 NOTE — Progress Notes (Signed)
Pt called asking for order be placed for ESI for his back.  - Order placed  Pt also wanted to let Dr. Margaretha Sheffield to know he is still having rt hip pain. Wants a call back to discuss a treatment plan.   - Per Dr. Margaretha Sheffield, recommends a referral to Dr. Magnus Ivan to see what he suggests. Pt agrees with the plan. Referral placed.

## 2021-12-28 DIAGNOSIS — G4733 Obstructive sleep apnea (adult) (pediatric): Secondary | ICD-10-CM | POA: Diagnosis not present

## 2021-12-30 DIAGNOSIS — G4733 Obstructive sleep apnea (adult) (pediatric): Secondary | ICD-10-CM | POA: Diagnosis not present

## 2022-01-03 ENCOUNTER — Other Ambulatory Visit: Payer: Self-pay | Admitting: Sports Medicine

## 2022-01-03 MED ORDER — DIAZEPAM 5 MG PO TABS: ORAL_TABLET | ORAL | 0 refills | Status: DC

## 2022-01-04 DIAGNOSIS — E063 Autoimmune thyroiditis: Secondary | ICD-10-CM | POA: Diagnosis not present

## 2022-01-06 ENCOUNTER — Ambulatory Visit
Admission: RE | Admit: 2022-01-06 | Discharge: 2022-01-06 | Disposition: A | Discharge status: Home / Self Care | Payer: BC Managed Care – PPO | Source: Ambulatory Visit | Attending: Sports Medicine | Admitting: Sports Medicine

## 2022-01-06 DIAGNOSIS — M47817 Spondylosis without myelopathy or radiculopathy, lumbosacral region: Secondary | ICD-10-CM | POA: Diagnosis not present

## 2022-01-06 DIAGNOSIS — M545 Low back pain, unspecified: Secondary | ICD-10-CM

## 2022-01-06 MED ORDER — IOPAMIDOL (ISOVUE-M 200) INJECTION 41%
1.0000 mL | Freq: Once | INTRAMUSCULAR | Status: AC
  Administered 2022-01-06: 1 mL via EPIDURAL

## 2022-01-06 MED ORDER — METHYLPREDNISOLONE ACETATE 40 MG/ML INJ SUSP (RADIOLOG
80.0000 mg | Freq: Once | INTRAMUSCULAR | Status: AC
  Administered 2022-01-06: 80 mg via EPIDURAL

## 2022-01-06 NOTE — Discharge Instructions (Signed)

## 2022-01-09 ENCOUNTER — Telehealth: Payer: Self-pay | Admitting: Neurology

## 2022-01-09 NOTE — Telephone Encounter (Signed)
LVM asking pt to call back and schedule f/u. 

## 2022-01-09 NOTE — Telephone Encounter (Signed)
Can you get him a follow up with sarah slack please maybe this or next month?

## 2022-01-11 ENCOUNTER — Ambulatory Visit (INDEPENDENT_AMBULATORY_CARE_PROVIDER_SITE_OTHER): Payer: BC Managed Care – PPO | Admitting: Orthopaedic Surgery

## 2022-01-11 VITALS — Ht 72.0 in | Wt 228.0 lb

## 2022-01-11 DIAGNOSIS — M25551 Pain in right hip: Secondary | ICD-10-CM

## 2022-01-11 DIAGNOSIS — M25552 Pain in left hip: Secondary | ICD-10-CM

## 2022-01-11 MED ORDER — METHYLPREDNISOLONE ACETATE 40 MG/ML IJ SUSP
40.0000 mg | INTRAMUSCULAR | Status: AC | PRN
  Administered 2022-01-11: 40 mg via INTRA_ARTICULAR

## 2022-01-11 MED ORDER — LIDOCAINE HCL 1 % IJ SOLN
3.0000 mL | INTRAMUSCULAR | Status: AC | PRN
  Administered 2022-01-11: 3 mL

## 2022-01-11 NOTE — Progress Notes (Signed)
The patient is a very pleasant and active 66 year old gentleman sent to me from Dr. Vertis Kelch to evaluate and treat bilateral hip pain with the right worse than left.  He actually has a MRI of his pelvis and plain films on the canopy system for me to review.  He is an avid Teacher, English as a foreign language.  He has been dealing with worsening right hip pain for some time now.  He denies any groin pain bilaterally.  He has had a recent epidural steroid injection in his lumbar spine and that is helped other issues.  He does not walk with a significant limp.  He does have significant pain more so on his right hip when he lays on that side at night and is getting uncomfortable with activities day living as well.  He is not a diabetic.  He last had a steroid injection in both trochanteric areas by Dr. Margaretha Sheffield just over 3 months ago.  He is sent appropriately to [place] to further assess his hips.  I was able to review all of his notes within epic including his past medical history, past surgical history, social history and medications.  He currently denies any fever, chills, nausea, vomiting.  His BMI is 30.92.  He is mobile and gets up on the exam table easily.  Both hips move smoothly and fluidly with no blocks or rotation at all and no pain in the groin at all.  He is exquisitely tender to palpation over the tip of the greater trochanter on the right side and less on the left side.  It seems to also involve the proximal IT band.  I did review plain films of both hips that are unremarkable and show well-maintained joint space.  I did look at the MRI of his pelvis and did not see any cortical irregularities in either hip.  The radiologist says that there is a labral tear on the right hip and up with the left hip as well but I do not believe this is an arthrogram and the patient has no pain in the groin at all.  There is no effusion of either hip and no cortical irregularities or cartilage changes of either hip.  There is some tendinosis of the  insertion of the gluteus medius and minimus tendons.  This seems to correlate where the patient is most painful.  I talked him about his MRI and plain film findings in detail and showed him these findings.  I did recommend at least 1 more steroid injection to get him through the holidays over the trochanteric area since it has been over 3 months and I want to do this just on the right side.  I found the area of maximal tenderness and was able to provide a steroid injection over this area.  Given the findings on the MRI, I would like to send him to my partner Dr. Steward Drone to more closely assess the patient's right hip in terms of the gluteus medius and minimus tendons and even considering an assessment of the hip joint.  The patient does agree with this treatment plan and did tolerate my injection well.  We will work on getting an appointment with my partner.       Procedure Note  Patient: Joseph Fuentes             Date of Birth: 02-20-1955           MRN: [id number]             Visit  Date: 01/11/2022  Procedures: Visit Diagnoses:  1. Pain of right hip   2. Pain of left hip     Large Joint Inj: R greater trochanter on 01/11/2022 4:46 PM Indications: pain and diagnostic evaluation Details: 22 G 1.5 in needle, lateral approach  Arthrogram: No  Medications: 3 mL lidocaine 1 %; 40 mg methylPREDNISolone acetate 40 MG/ML Outcome: tolerated well, no immediate complications Procedure, treatment alternatives, risks and benefits explained, specific risks discussed. Consent was given by the patient. Immediately prior to procedure a time out was called to verify the correct patient, procedure, equipment, support staff and site/side marked as required. Patient was prepped and draped in the usual sterile fashion.

## 2022-01-12 ENCOUNTER — Encounter: Payer: Self-pay | Admitting: Orthopaedic Surgery

## 2022-01-24 DIAGNOSIS — J40 Bronchitis, not specified as acute or chronic: Secondary | ICD-10-CM | POA: Diagnosis not present

## 2022-01-24 DIAGNOSIS — J069 Acute upper respiratory infection, unspecified: Secondary | ICD-10-CM | POA: Diagnosis not present

## 2022-01-25 ENCOUNTER — Ambulatory Visit (INDEPENDENT_AMBULATORY_CARE_PROVIDER_SITE_OTHER): Payer: BC Managed Care – PPO | Admitting: Orthopaedic Surgery

## 2022-01-25 DIAGNOSIS — M67959 Unspecified disorder of synovium and tendon, unspecified thigh: Secondary | ICD-10-CM

## 2022-01-25 NOTE — Progress Notes (Signed)
Chief Complaint: Right hip pain     History of Present Illness:    Joseph Fuentes is a 66 y.o. male presents today with right lateral based hip pain which has been ongoing now for several years.  He states that his symptoms are quite significant when he is laying on the side.  He states that he does experience pain and soreness with activities like golf although this does not limit his ability to play.  At this time he has had multiple injections into the lateral trochanteric region initially which gave him significant relief but progressively become less helpful.  At this point his injections lasted only a period of a few weeks.  He has had extensive physical therapy in the past although this only provided him with minimal relief.  He is here today for further assessment as a referral from my partner Dr. Magnus Ivan.  Of note he does have a history of an L4-L5 disc herniation for which she has previously had epidural injections although this is been different he states from his lateral based hip pain    Surgical History:   none  PMH/PSH/Family History/Social History/Meds/Allergies:    Past Medical History:  Diagnosis Date   Allergic rhinitis due to dust    Anxiety    BPH (benign prostatic hyperplasia)    Bulging lumbar disc    Celiac disease/sprue    Concussion    Deviated septum    Dyshidrotic eczema    Gastroesophageal reflux    Hypothyroidism    IBS (irritable bowel syndrome)    Insomnia    Low back pain    Migraines    Nephrolithiasis    Neuropathy    Onychomycosis    OSA (obstructive sleep apnea)    Osteopenia    Peripheral neuropathy    Rheumatic fever    66 y.o.   Seborrheic keratosis    Shingles    Past Surgical History:  Procedure Laterality Date   arthroscopic knee surgery Left    COLONOSCOPY     SEPTOPLASTY     TENNIS ELBOW RELEASE/NIRSCHEL PROCEDURE     VASECTOMY     Social History   Socioeconomic History   Marital  status: Married    Spouse name: Not on file   Number of children: 2   Years of education: 14   Highest education level: Associate degree: academic program  Occupational History   Occupation: printing  Tobacco Use   Smoking status: Former    Types: Cigarettes    Quit date: 06/28/2006    Years since quitting: 15.5   Smokeless tobacco: Never  Vaping Use   Vaping Use: Never used  Substance and Sexual Activity   Alcohol use: Yes    Alcohol/week: 2.0 standard drinks of alcohol    Types: 2 Cans of beer per week    Comment: social, during bowling season   Drug use: Not Currently    Comment: none in decades    Sexual activity: Not on file  Other Topics Concern   Not on file  Social History Narrative   Lives at home with his wife   Left handed   2-3 Caffeine daily    Social Determinants of Health   Financial Resource Strain: Not on file  Food Insecurity: Not on file  Transportation Needs: Not on  file  Physical Activity: Not on file  Stress: Not on file  Social Connections: Not on file   Family History  Problem Relation Age of Onset   Alzheimer's disease Father        severe dementia   High Cholesterol Father    Gout Father    Other Father        back problems   Migraines Other        strong maternal fam hx    High blood pressure Mother    Lung cancer Mother        54   Neuropathy Mother    Gout Mother    Allergies  Allergen Reactions   Gabapentin    Oxycodone Itching, Nausea And Vomiting and Nausea Only   Penicillins     Reaction occurred as an infant; parents told him never to take PCN   Terbinafine And Related Rash    All over body   Current Outpatient Medications  Medication Sig Dispense Refill   ALLEGRA-D ALLERGY & CONGESTION 180-240 MG 24 hr tablet Take 1 tablet by mouth daily.   3   ALPRAZolam (XANAX) 0.5 MG tablet 0.5-1 tablet by mouth as needed.     aspirin-acetaminophen-caffeine (EXCEDRIN MIGRAINE) 250-250-65 MG tablet Take by mouth every 6 (six)  hours as needed for headache.     desoximetasone (TOPICORT) 0.25 % cream Apply 1 application topically daily as needed.     DEXILANT 60 MG capsule Take 60 mg by mouth every other day.     diazepam (VALIUM) 5 MG tablet Take one tablet one hour prior to peocedure 1 tablet 0   docusate sodium (COLACE) 100 MG capsule Take 100 mg by mouth daily.      eletriptan (RELPAX) 40 MG tablet Take 1 tablet (40 mg total) by mouth every 2 (two) hours as needed for migraine or headache. Maximum twice daily. 42 tablet 3   fluticasone (CUTIVATE) 0.005 % ointment Apply 1 application topically 2 (two) times daily.     fluticasone (FLONASE) 50 MCG/ACT nasal spray Place 2 sprays into both nostrils daily. 48 g 3   Fremanezumab-vfrm (AJOVY) 225 MG/1.5ML SOAJ Inject 225 mg into the skin every 30 (thirty) days. 4.5 mL 3   HYDROcodone-acetaminophen (NORCO) 10-325 MG tablet Take 1 tablet by mouth as needed.      ibuprofen (ADVIL,MOTRIN) 600 MG tablet Take 600 mg by mouth as needed.     ipratropium (ATROVENT) 0.06 % nasal spray 1 spray in each nostril 2 times daily 45 mL 3   NAFTIN 2 % CREA as needed.     NASAL SALINE NA Place into the nose as needed.     omeprazole (PRILOSEC) 40 MG capsule Take 1-2 tablets by mouth at bedtime.      POTASSIUM PO Take 99 mg by mouth.      SYNTHROID 137 MCG tablet Take 274 mcg by mouth every morning.     tamsulosin (FLOMAX) 0.4 MG CAPS capsule 1 capsule 30 minutes after the same meal each day     topiramate (TOPAMAX) 200 MG tablet Take 1 tablet (200 mg total) by mouth daily. 90 tablet 4   triamcinolone ointment (KENALOG) 0.1 % as needed.     WELCHOL 625 MG tablet Take 3 tablets by mouth Twice daily.     zolpidem (AMBIEN CR) 12.5 MG CR tablet Take 12.5 mg by mouth at bedtime.      No current facility-administered medications for this visit.   No results found.  Review of Systems:   A ROS was performed including pertinent positives and negatives as documented in the HPI.  Physical Exam :    Constitutional: NAD and appears stated age Neurological: Alert and oriented Psych: Appropriate affect and cooperative There were no vitals taken for this visit.   Comprehensive Musculoskeletal Exam:    Inspection Right Left  Skin No atrophy or gross abnormalities appreciated No atrophy or gross abnormalities appreciated  Palpation    Tenderness Lateral trochanter None  Crepitus None None  Range of Motion    Flexion (passive) 120 120  Extension 30 30  IR 20 20  ER 45 45  Strength    Flexion  5/5 5/5  Extension 5/5 5/5  Special Tests    FABER Negative Negative  FADIR Negative Negative  ER Lag/Capsular Insufficiency Negative Negative  Instability Negative Negative  Sacroiliac pain Negative  Negative   Instability    Generalized Laxity No No  Neurologic    sciatic, femoral, obturator nerves intact to light sensation  Vascular/Lymphatic    DP pulse 2+ 2+  Lumbar Exam    Patient has symmetric lumbar range of motion with negative pain referral to hip   Pain with resisted abduction and mild Trendelenburg gait on the right  Imaging:   Xray (2 views right hip): Normal  MRI (right hip): There is undersurface tearing of the gluteus medius tendon  I personally reviewed and interpreted the radiographs.   Assessment:   66 y.o. male with undersurface tearing of the gluteus medius tendon.  I described that at today's visit overall I do believe that his pain and tenderness along with positive Trendelenburg are consistent with a gluteus medius tear.  Side effect we did discuss continued treatment options including additional physical therapy.  At this time he has trialed multiple injections and these only seem to be temporary.  Side effect I did discuss the possibility of a right gluteus medius repair.  I did discuss the inherent limitations and postop restrictions associated with this.  We did discuss the possibility of scar sensitivity.  He understands this.  He would like to  consider his options and he will send [place] a MyChart message back should he want to proceed with a right gluteus medius tendon repair  Plan :    -He will send [place] a MyChart message when he is further considered his options     I personally saw and evaluated the patient, and participated in the management and treatment plan.  Huel Cote, MD Attending Physician, Orthopedic Surgery  This document was dictated using Dragon voice recognition software. A reasonable attempt at proof reading has been made to minimize errors.

## 2022-01-26 ENCOUNTER — Other Ambulatory Visit: Payer: Self-pay

## 2022-01-26 ENCOUNTER — Other Ambulatory Visit: Payer: Self-pay | Admitting: *Deleted

## 2022-01-26 DIAGNOSIS — G43709 Chronic migraine without aura, not intractable, without status migrainosus: Secondary | ICD-10-CM

## 2022-01-26 DIAGNOSIS — G4733 Obstructive sleep apnea (adult) (pediatric): Secondary | ICD-10-CM | POA: Diagnosis not present

## 2022-01-26 MED ORDER — TOPIRAMATE 200 MG PO TABS: 200.0000 mg | ORAL_TABLET | Freq: Every day | ORAL | 4 refills | Status: DC

## 2022-01-27 DIAGNOSIS — K219 Gastro-esophageal reflux disease without esophagitis: Secondary | ICD-10-CM | POA: Diagnosis not present

## 2022-01-27 DIAGNOSIS — E039 Hypothyroidism, unspecified: Secondary | ICD-10-CM | POA: Diagnosis not present

## 2022-01-27 DIAGNOSIS — E063 Autoimmune thyroiditis: Secondary | ICD-10-CM | POA: Diagnosis not present

## 2022-01-27 DIAGNOSIS — D7589 Other specified diseases of blood and blood-forming organs: Secondary | ICD-10-CM | POA: Diagnosis not present

## 2022-01-27 DIAGNOSIS — G4733 Obstructive sleep apnea (adult) (pediatric): Secondary | ICD-10-CM | POA: Diagnosis not present

## 2022-01-30 ENCOUNTER — Encounter: Payer: Self-pay | Admitting: Neurology

## 2022-01-31 ENCOUNTER — Ambulatory Visit
Admission: RE | Admit: 2022-01-31 | Discharge: 2022-01-31 | Disposition: A | Discharge status: Home / Self Care | Payer: BC Managed Care – PPO | Source: Ambulatory Visit | Attending: Family Medicine | Admitting: Family Medicine

## 2022-01-31 ENCOUNTER — Ambulatory Visit (INDEPENDENT_AMBULATORY_CARE_PROVIDER_SITE_OTHER): Payer: BC Managed Care – PPO

## 2022-01-31 ENCOUNTER — Telehealth: Payer: Self-pay | Admitting: Orthopaedic Surgery

## 2022-01-31 VITALS — BP 120/74 | HR 89 | Temp 99.0°F | Resp 16

## 2022-01-31 DIAGNOSIS — R051 Acute cough: Secondary | ICD-10-CM

## 2022-01-31 DIAGNOSIS — J019 Acute sinusitis, unspecified: Secondary | ICD-10-CM | POA: Diagnosis not present

## 2022-01-31 DIAGNOSIS — R059 Cough, unspecified: Secondary | ICD-10-CM | POA: Diagnosis not present

## 2022-01-31 DIAGNOSIS — R0602 Shortness of breath: Secondary | ICD-10-CM | POA: Diagnosis not present

## 2022-01-31 MED ORDER — HYDROCOD POLI-CHLORPHE POLI ER 10-8 MG/5ML PO SUER: 5.0000 mL | Freq: Two times a day (BID) | ORAL | 0 refills | Status: DC | PRN

## 2022-01-31 MED ORDER — CEPHALEXIN 500 MG PO CAPS: 500.0000 mg | ORAL_CAPSULE | Freq: Three times a day (TID) | ORAL | 0 refills | Status: AC

## 2022-01-31 NOTE — Telephone Encounter (Signed)
Received vm from pts wife, Malachy Mood requesting that patients medical records be faxed to Dr. Maureen Ralphs. IC, spoke with patient. Advised him that he will need to come by and sign authorization before we can process his request. He stated he will come by today or tomorrow. [phone]

## 2022-01-31 NOTE — Discharge Instructions (Signed)
Take cephalexin 500 mg--1 capsule 3 times daily for 10 days  Chlorpheniramine-hydrocodone--take 5 mL every 12 hours as needed for cough.

## 2022-01-31 NOTE — ED Triage Notes (Signed)
Pt c/o cough,head/chest congestion x 10 days-pt states he has had increase in migraine HA since beginning of Nov-pt grimacing/holding head-requested/given ice pack-pt completed zpack, steroid, cough med from [facility]

## 2022-01-31 NOTE — ED Provider Notes (Signed)
[facility]    CSN: [id number] Arrival date & time: 01/31/22  1843      History   Chief Complaint Chief Complaint  Patient presents with   Cough    Entered by patient    HPI Joseph Fuentes is a 67 y.o. male.    Cough  Here for a 2 to 3-week history of nasal congestion.  It worsened right before Christmas and he has had increased cough and sinus pressure and drainage.  On December 26 he was prescribed a Z-Pak, prednisone, and cough syrup.  He has not improved. His migraines are bothering him more lately due to some changes in his medication.  He is messaging his neurologist about that  He has felt short of breath and these last few days.  Temperature here is 99  He is allergic to penicillin which causes an unknown reaction, but he reports he has taken Keflex with success in the past   Past Medical History:  Diagnosis Date   Allergic rhinitis due to dust    Anxiety    BPH (benign prostatic hyperplasia)    Bulging lumbar disc    Celiac disease/sprue    Concussion    Deviated septum    Dyshidrotic eczema    Gastroesophageal reflux    Hypothyroidism    IBS (irritable bowel syndrome)    Insomnia    Low back pain    Migraines    Nephrolithiasis    Neuropathy    Onychomycosis    OSA (obstructive sleep apnea)    Osteopenia    Peripheral neuropathy    Rheumatic fever    67 y.o.   Seborrheic keratosis    Shingles     Patient Active Problem List   Diagnosis Date Noted   Chronic migraine without aura without status migrainosus, not intractable 05/12/2020   Migraines    Other allergic rhinitis 04/24/2019   Polyneuropathy 11/13/2018   Pain in joint, shoulder region 08/19/2014   Left knee pain 04/30/2013   Plantar fasciitis 03/06/2013   HYPERLIPIDEMIA 04/20/2009   ANXIETY 04/20/2009   DIARRHEA 04/20/2009   GASTROPARESIS 04/07/2009   GERD 03/10/2009   IRRITABLE BOWEL SYNDROME 03/10/2009    Past Surgical History:  Procedure Laterality Date    arthroscopic knee surgery Left    COLONOSCOPY     SEPTOPLASTY     TENNIS ELBOW RELEASE/NIRSCHEL PROCEDURE     VASECTOMY         Home Medications    Prior to Admission medications   Medication Sig Start Date End Date Taking? Authorizing Provider  cephALEXin (KEFLEX) 500 MG capsule Take 1 capsule (500 mg total) by mouth 3 (three) times daily for 10 days. 01/31/22 02/10/22 Yes Leilanie Rauda, Gwenlyn Perking, MD  chlorpheniramine-HYDROcodone (TUSSIONEX) 10-8 MG/5ML Take 5 mLs by mouth every 12 (twelve) hours as needed for cough. 01/31/22  Yes Emilly Lavey, Gwenlyn Perking, MD  ALLEGRA-D ALLERGY & CONGESTION 180-240 MG 24 hr tablet Take 1 tablet by mouth daily.  08/25/15   [provider]  ALPRAZolam Duanne Moron) 0.5 MG tablet 0.5-1 tablet by mouth as needed.    [provider]  aspirin-acetaminophen-caffeine (EXCEDRIN MIGRAINE) [phone] MG tablet Take by mouth every 6 (six) hours as needed for headache.    [provider]  desoximetasone (TOPICORT) 0.25 % cream Apply 1 application topically daily as needed.    [provider]  DEXILANT 60 MG capsule Take 60 mg by mouth every other day. 07/27/11   [provider]  diazepam (VALIUM)  5 MG tablet Take one tablet one hour prior to peocedure 01/03/22   Reino Bellis R, DO  docusate sodium (COLACE) 100 MG capsule Take 100 mg by mouth daily.     [provider]  eletriptan (RELPAX) 40 MG tablet Take 1 tablet (40 mg total) by mouth every 2 (two) hours as needed for migraine or headache. Maximum twice daily. 06/22/21   [name], MD  fluticasone (CUTIVATE) 0.005 % ointment Apply 1 application topically 2 (two) times daily.    [provider]  fluticasone (FLONASE) 50 MCG/ACT nasal spray Place 2 sprays into both nostrils daily. 06/10/21   Kozlow, Alvira Philips, MD  Fremanezumab-vfrm (AJOVY) 225 MG/1.5ML SOAJ Inject 225 mg into the skin every 30 (thirty) days. 06/22/21   [name], MD  HYDROcodone-acetaminophen (NORCO)  10-325 MG tablet Take 1 tablet by mouth as needed.  07/08/14   [provider]  ibuprofen (ADVIL,MOTRIN) 600 MG tablet Take 600 mg by mouth as needed.    [provider]  ipratropium (ATROVENT) 0.06 % nasal spray 1 spray in each nostril 2 times daily 06/10/21   Kozlow, Alvira Philips, MD  NAFTIN 2 % CREA as needed. 03/09/13   [provider]  NASAL SALINE NA Place into the nose as needed.    [provider]  omeprazole (PRILOSEC) 40 MG capsule Take 1-2 tablets by mouth at bedtime.  06/27/11   [provider]  POTASSIUM PO Take 99 mg by mouth.     [provider]  SYNTHROID 137 MCG tablet Take 274 mcg by mouth every morning. 02/09/21   [provider]  tamsulosin (FLOMAX) 0.4 MG CAPS capsule 1 capsule 30 minutes after the same meal each day    [provider]  topiramate (TOPAMAX) 200 MG tablet Take 1 tablet (200 mg total) by mouth daily. 01/26/22   [name], MD  triamcinolone ointment (KENALOG) 0.1 % as needed. 03/18/13   [provider]  WELCHOL 625 MG tablet Take 3 tablets by mouth Twice daily. 07/21/11   [provider]  zolpidem (AMBIEN CR) 12.5 MG CR tablet Take 12.5 mg by mouth at bedtime.  07/12/14   [provider]    Family History Family History  Problem Relation Age of Onset   Alzheimer's disease Father        severe dementia   High Cholesterol Father    Gout Father    Other Father        back problems   Migraines Other        strong maternal fam hx    High blood pressure Mother    Lung cancer Mother        43   Neuropathy Mother    Gout Mother     Social History Social History   Tobacco Use   Smoking status: Former    Types: Cigarettes    Quit date: 06/28/2006    Years since quitting: 15.6   Smokeless tobacco: Never  Vaping Use   Vaping Use: Never used  Substance Use Topics   Alcohol use: Yes    Alcohol/week: 2.0 standard drinks of alcohol    Types: 2 Cans of beer per  week   Drug use: Not Currently     Allergies   Gabapentin, Oxycodone, Penicillins, and Terbinafine and related   Review of Systems Review of Systems  Respiratory:  Positive for cough.      Physical Exam Triage Vital Signs ED Triage Vitals  Enc Vitals Group     BP 01/31/22 1917 120/74     Pulse Rate 01/31/22 1917 89     Resp 01/31/22 1917 16     Temp 01/31/22 1917 99 F (37.2 C)     Temp Source 01/31/22 1917 Oral     SpO2 01/31/22 1917 96 %     Weight --      Height --      Head Circumference --      Peak Flow --      Pain Score 01/31/22 1946 10     Pain Loc --      Pain Edu? --      Excl. in [place]? --    No data found.  Updated Vital Signs BP 120/74 (BP Location: Right Arm)   Pulse 89   Temp 99 F (37.2 C) (Oral)   Resp 16   SpO2 96%   Visual Acuity Right Eye Distance:   Left Eye Distance:   Bilateral Distance:    Right Eye Near:   Left Eye Near:    Bilateral Near:     Physical Exam Vitals reviewed.  Constitutional:      General: He is not in acute distress.    Appearance: He is not ill-appearing, toxic-appearing or diaphoretic.  HENT:     Right Ear: Tympanic membrane normal.     Left Ear: Tympanic membrane normal.     Nose: Congestion present.     Mouth/Throat:     Mouth: Mucous membranes are moist.     Pharynx: No oropharyngeal exudate or posterior oropharyngeal erythema.  Eyes:     Extraocular Movements: Extraocular movements intact.     Conjunctiva/sclera: Conjunctivae normal.     Pupils: Pupils are equal, round, and reactive to light.  Cardiovascular:     Rate and Rhythm: Normal rate and regular rhythm.     Heart sounds: No murmur heard. Pulmonary:     Effort: Pulmonary effort is normal. No respiratory distress.     Breath sounds: No stridor. No wheezing, rhonchi or rales.  Skin:    Coloration: Skin is not jaundiced or pale.  Neurological:     General: No focal deficit present.     Mental Status: He is oriented to person, place, and  time.      UC Treatments / Results  Labs (all labs ordered are listed, but only abnormal results are displayed) Labs Reviewed - No data to display  EKG   Radiology DG Chest 2 View  Result Date: 01/31/2022 CLINICAL DATA:  Cough and shortness of breath.  Chest congestion. EXAM: CHEST - 2 VIEW COMPARISON:  07/02/2020 FINDINGS: The cardiomediastinal contours are normal. The lungs are clear. Pulmonary vasculature is normal. No consolidation, pleural effusion, or pneumothorax. No acute osseous abnormalities are seen. Slight thoracic spondylosis. IMPRESSION: No acute chest findings. Electronically Signed   By: Keith Rake M.D.   On: 01/31/2022 20:32    Procedures Procedures (including critical care time)  Medications Ordered in UC Medications - No data to display  Initial Impression / Assessment and Plan / UC Course  I have reviewed the triage vital signs and the nursing notes.  Pertinent labs & imaging results that were available during my care of the patient were reviewed by me and considered in my medical decision making (see chart for details).       X-ray is clear.  He is treated with cephalexin which she notes he has tolerated well in the past.  Tussionex  cough syrup is sent in.  Final Clinical Impressions(s) / UC Diagnoses   Final diagnoses:  Acute sinusitis, recurrence not specified, unspecified location  Acute cough     Discharge Instructions      Take cephalexin 500 mg--1 capsule 3 times daily for 10 days  Chlorpheniramine-hydrocodone--take 5 mL every 12 hours as needed for cough.       ED Prescriptions     Medication Sig Dispense Auth. Provider   cephALEXin (KEFLEX) 500 MG capsule Take 1 capsule (500 mg total) by mouth 3 (three) times daily for 10 days. 30 capsule Zenia Resides, MD   chlorpheniramine-HYDROcodone (TUSSIONEX) 10-8 MG/5ML Take 5 mLs by mouth every 12 (twelve) hours as needed for cough. 60 mL Zenia Resides, MD      I have  reviewed the PDMP during this encounter.   Zenia Resides, MD 01/31/22 2043

## 2022-02-07 ENCOUNTER — Ambulatory Visit (INDEPENDENT_AMBULATORY_CARE_PROVIDER_SITE_OTHER): Payer: BC Managed Care – PPO | Admitting: Neurology

## 2022-02-07 ENCOUNTER — Encounter: Payer: Self-pay | Admitting: Neurology

## 2022-02-07 VITALS — BP 151/71 | HR 72 | Ht 72.0 in | Wt 225.0 lb

## 2022-02-07 DIAGNOSIS — F411 Generalized anxiety disorder: Secondary | ICD-10-CM

## 2022-02-07 DIAGNOSIS — R413 Other amnesia: Secondary | ICD-10-CM | POA: Diagnosis not present

## 2022-02-07 DIAGNOSIS — G43709 Chronic migraine without aura, not intractable, without status migrainosus: Secondary | ICD-10-CM

## 2022-02-07 MED ORDER — AJOVY 225 MG/1.5ML ~~LOC~~ SOAJ: 225.0000 mg | SUBCUTANEOUS | 3 refills | Status: DC

## 2022-02-07 MED ORDER — ELETRIPTAN HYDROBROMIDE 40 MG PO TABS: 40.0000 mg | ORAL_TABLET | ORAL | 3 refills | Status: DC | PRN

## 2022-02-07 NOTE — Progress Notes (Signed)
Patient: Joseph Fuentes Date of Birth: 29-Jan-1956  Reason for Visit: Follow up History from: Patient, wife  Primary Neurologist: Joseph Fuentes   ASSESSMENT AND PLAN 67 y.o. year old male   Chronic migraine headache  -Actually doing better, broke the cycle of rebound headache -For now, continue Topamax 200 mg daily, Ajovy for migraine preventative -Continue Relpax as needed for severe headaches but cautioned about potential for rebound headaches, encouraged to try Ubrelvy 100 mg as needed, less chance for rebound headache -If migraines increase consider Botox, or switch to [place]  Reported memory change for several years, no change with Topamax reduction or elimination Anxiety  -MMSE 30/30 today -I recommend MRI of the brain, patient wishes to hold off since currently has high deductible  -Recommend labs for reversible causes of memory changes, have labs done endocrinology wants to see what was already collected, otherwise to consider TSH, B12, RPR, sed rate -Referral for neuropsychological testing -Already on CPAP -Considered switching to Zonegran but had such a major increase in headache with Topamax reduction -Wishes to follow up with Dr. Lucia Fuentes in around 4 months  HISTORY OF PRESENT ILLNESS: Today 02/07/22 Here today to discuss worsening headaches. Review of my chart message 12/27/21 decreased Topamax  down to 100 mg daily due to memory concerns, no worsening in headaches.01/02/22 stopped Topamax completely almost immediate return of headache. 01/31/22 urgent care for sinusitis, given cephalexin, 12/26 he took z-pack and prednisone. Has virtually had a migraine daily since. In rebound headache, taking daily Relpax. 4 days ago he stopped the Relpax, instead lay down in dark room with ice pack. Yesterday he had no headache. Still on Ajovy, taking Topamax 200 mg in AM (has always done in AM). Has been on Topamax for 15 years. Has tried Nurtec didn't help at all, took [place] once it may have  helped, he has samples of both at home.   Also, wants to discuss memory troubles which is why he tried to initially come off the Topamax to start. Trouble staying focused. He is a Engineer, civil (consulting). His mind need to stay on task, has done for 40 years, last few years more trouble focusing, in other aspects of his life. Lose train of thought, driving forget where he is going, can cause panic attack. Gathers thoughts comes back. Pays bills, lets them sit long, because he avoids, is a bigger job then. Wife feels more forgetful, she thinks related to age. Is under a lot of work related stress. His wife lost her job in the summer, just got a new one. He is having to learn a new task at work. Planning to retire in 2 years. Wears CPAP nightly. Lately sleeping well due to taking cold medication, otherwise has chronic pain that is under good control after recent injections. He noticed no change in the memory after reducing/stopping the Topamax.   HISTORY  06/22/2021: could not connect via video, we spoke on the phone briefly, Joseph Fuentes < 5 minutes refill meds see in one year.   05/12/2020: He was miserable with the headaches. He started ajovy and stopped chocolate and he fels better. The nurtec helps a little. 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 We discussed other options for acute management, we can try Ubrelvy   Patient complains of symptoms per HPI as well as the following symptoms: headache much improved, sleep apnea compliant on cpap. Pertinent negatives and positives per HPI. All others negative  sARA Joseph Fuentes: 02/03/20  Joseph Fuentes is a very pleasant 67 year old male with long history of migraine headaches.  Previously, on Topamax 200 mg daily, he reported memory loss.  In the past, he started Emgality, for 3 months, weaned dose of Topamax, Emgality was not helpful, ultimately Topamax was increased back to 200 mg twice a day.  He was  going to try Aimovig, never did.  For quite a while, his headaches have been fairly well controlled.  However, in the past 2 to 3 months, has had increase in frequency of headache, almost daily.  He got into a cycle of rebound headache, taking frequent Relpax, ibuprofen, Excedrin.  Migraines, having typical presentation, starts at the right cheek, radiates to the right temple, can be associated with right eye drooping, with migraine features.  The only change, is the last several months, he may have 2-3 migraines in 1 day.  As he has started to break away from the rebound cycle, headaches have improved.  He has not had a migraine in the last 2 days.  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 Currently remains on Topamax 200 mg daily.  Takes hydrocodone for chronic back pain.  Presents today for evaluation accompanied by his wife. Headaches have been so bad, he had to walk off the golf course. Not interested in Botox.     REVIEW OF SYSTEMS: Out of a complete 14 system review of symptoms, the patient complains only of the following symptoms, and all other reviewed systems are negative.  See HPI  ALLERGIES: Allergies  Allergen Reactions   Gabapentin    Oxycodone Itching, Nausea And Vomiting and Nausea Only   Penicillins     Reaction occurred as an infant; parents told him never to take PCN   Terbinafine And Related Rash    All over body    HOME MEDICATIONS: Outpatient Medications Prior to Visit  Medication Sig Dispense Refill   ALLEGRA-D ALLERGY & CONGESTION 180-240 MG 24 hr tablet Take 1 tablet by mouth daily.   3   ALPRAZolam (XANAX) 0.5 MG tablet 0.5-1 tablet by mouth as needed.     aspirin-acetaminophen-caffeine (EXCEDRIN MIGRAINE) 250-250-65 MG tablet Take by mouth every 6 (six) hours as needed for headache.     cephALEXin (KEFLEX) 500 MG capsule Take 1 capsule (500 mg total) by mouth 3 (three) times daily for 10 days. 30 capsule 0    chlorpheniramine-HYDROcodone (TUSSIONEX) 10-8 MG/5ML Take 5 mLs by mouth every 12 (twelve) hours as needed for cough. 60 mL 0   desoximetasone (TOPICORT) 0.25 % cream Apply 1 application topically daily as needed.     DEXILANT 60 MG capsule Take 60 mg by mouth every other day.     docusate sodium (COLACE) 100 MG capsule Take 100 mg by mouth daily.      fluticasone (CUTIVATE) 0.005 % ointment Apply 1 application topically 2 (two) times daily.     fluticasone (FLONASE) 50 MCG/ACT nasal spray Place 2 sprays into both nostrils daily. 48 g 3   HYDROcodone-acetaminophen (NORCO) 10-325 MG tablet Take 1 tablet by mouth as needed.      ibuprofen (ADVIL,MOTRIN) 600 MG tablet Take 600 mg by mouth as needed.     ipratropium (ATROVENT) 0.06 % nasal spray 1 spray in each nostril 2 times daily 45 mL 3   NAFTIN 2 % CREA as needed.     omeprazole (PRILOSEC) 40 MG capsule Take 1-2 tablets by mouth at bedtime.  POTASSIUM PO Take 99 mg by mouth.      SYNTHROID 112 MCG tablet Take 112 mcg by mouth daily before breakfast.     tamsulosin (FLOMAX) 0.4 MG CAPS capsule 1 capsule 30 minutes after the same meal each day     topiramate (TOPAMAX) 200 MG tablet Take 1 tablet (200 mg total) by mouth daily. 90 tablet 4   triamcinolone ointment (KENALOG) 0.1 % as needed.     WELCHOL 625 MG tablet Take 3 tablets by mouth Twice daily.     zolpidem (AMBIEN CR) 12.5 MG CR tablet Take 12.5 mg by mouth at bedtime.      eletriptan (RELPAX) 40 MG tablet Take 1 tablet (40 mg total) by mouth every 2 (two) hours as needed for migraine or headache. Maximum twice daily. 42 tablet 3   Fremanezumab-vfrm (AJOVY) 225 MG/1.5ML SOAJ Inject 225 mg into the skin every 30 (thirty) days. 4.5 mL 3   diazepam (VALIUM) 5 MG tablet Take one tablet one hour prior to peocedure 1 tablet 0   NASAL SALINE NA Place into the nose as needed.     SYNTHROID 137 MCG tablet Take 274 mcg by mouth every morning.     No facility-administered medications prior to  visit.    PAST MEDICAL HISTORY: Past Medical History:  Diagnosis Date   Allergic rhinitis due to dust    Anxiety    BPH (benign prostatic hyperplasia)    Bulging lumbar disc    Celiac disease/sprue    Concussion    Deviated septum    Dyshidrotic eczema    Gastroesophageal reflux    Hypothyroidism    IBS (irritable bowel syndrome)    Insomnia    Low back pain    Migraines    Nephrolithiasis    Neuropathy    Onychomycosis    OSA (obstructive sleep apnea)    Osteopenia    Peripheral neuropathy    Rheumatic fever    67 y.o.   Seborrheic keratosis    Shingles     PAST SURGICAL HISTORY: Past Surgical History:  Procedure Laterality Date   arthroscopic knee surgery Left    COLONOSCOPY     SEPTOPLASTY     TENNIS ELBOW RELEASE/NIRSCHEL PROCEDURE     VASECTOMY      FAMILY HISTORY: Family History  Problem Relation Age of Onset   Alzheimer's disease Father        severe dementia   High Cholesterol Father    Gout Father    Other Father        back problems   Migraines Other        strong maternal fam hx    High blood pressure Mother    Lung cancer Mother        41   Neuropathy Mother    Gout Mother     SOCIAL HISTORY: Social History   Socioeconomic History   Marital status: Married    Spouse name: Not on file   Number of children: 2   Years of education: 14   Highest education level: Associate degree: academic program  Occupational History   Occupation: printing  Tobacco Use   Smoking status: Former    Types: Cigarettes    Quit date: 06/28/2006    Years since quitting: 15.6   Smokeless tobacco: Never  Vaping Use   Vaping Use: Never used  Substance and Sexual Activity   Alcohol use: Yes    Alcohol/week: 2.0 standard drinks of alcohol  Types: 2 Cans of beer per week   Drug use: Not Currently   Sexual activity: Not on file  Other Topics Concern   Not on file  Social History Narrative   Lives at home with his wife   Left handed   2-3 Caffeine  daily    Social Determinants of Health   Financial Resource Strain: Not on file  Food Insecurity: Not on file  Transportation Needs: Not on file  Physical Activity: Not on file  Stress: Not on file  Social Connections: Not on file  Intimate Partner Violence: Not on file    PHYSICAL EXAM  Vitals:   02/07/22 0753  BP: (!) 151/71  Pulse: 72  Weight: 225 lb (102.1 kg)  Height: 6' (1.829 m)   Body mass index is 30.52 kg/m.    02/07/2022    8:35 AM  MMSE - Mini Mental State Exam  Orientation to time 5  Orientation to Place 5  Registration 3  Attention/ Calculation 5  Recall 3  Language- name 2 objects 2  Language- repeat 1  Language- follow 3 step command 3  Language- read & follow direction 1  Write a sentence 1  Copy design 1  Total score 30   Generalized: Well developed, in no acute distress  Neurological examination  Mentation: Alert oriented to time, place, history taking. Follows all commands speech and language fluent Cranial nerve II-XII: Pupils were equal round reactive to light. Extraocular movements were full, visual field were full on confrontational test. Facial sensation and strength were normal.  Head turning and shoulder shrug  were normal and symmetric. Motor: The motor testing reveals 5 over 5 strength of all 4 extremities. Good symmetric motor tone is noted throughout.  Sensory: Sensory testing is intact to soft touch on all 4 extremities. No evidence of extinction is noted.  Coordination: Cerebellar testing reveals good finger-nose-finger and heel-to-shin bilaterally.  Gait and station: Gait is normal. Tandem gait is normal.  Reflexes: Deep tendon reflexes are symmetric and normal bilaterally.   DIAGNOSTIC DATA (LABS, IMAGING, TESTING) - I reviewed patient records, labs, notes, testing and imaging myself where available.  No results found for: "WBC", "HGB", "HCT", "MCV", "PLT"    Component Value Date/Time   NA 144 11/21/2017 1555   K 3.9  11/21/2017 1555   CL 107 (H) 11/21/2017 1555   CO2 18 (L) 11/21/2017 1555   GLUCOSE 102 (H) 11/21/2017 1555   BUN 12 11/21/2017 1555   CREATININE 1.11 11/21/2017 1555   CALCIUM 9.6 11/21/2017 1555   PROT 7.3 11/13/2018 1657   GFRNONAA 72 11/21/2017 1555   GFRAA 83 11/21/2017 1555   No results found for: "CHOL", "HDL", "LDLCALC", "LDLDIRECT", "TRIG", "CHOLHDL" Lab Results  Component Value Date   HGBA1C 5.7 (H) 11/13/2018   Lab Results  Component Value Date   VITAMINB12 440 11/13/2018   No results found for: "TSH"  Butler Denmark, AGNP-C, DNP 02/07/2022, 9:24 AM [facility] [address], [place], [place] [address] [phone]

## 2022-02-07 NOTE — Patient Instructions (Addendum)
I would recommend MRI of the brain  Check labs, let me know what was already checked  Referral to neuropsych testing for memory  For now continue migraine medications, call for worsening Try Ubrelvy 100 mg as needed for acute headache treatment See you back in 4 months with Dr. Jaynee Eagles

## 2022-02-08 ENCOUNTER — Telehealth: Payer: Self-pay | Admitting: Neurology

## 2022-02-08 NOTE — Telephone Encounter (Signed)
Referral for Neuropsychology sent through [facility] to [facility]. [phone]

## 2022-02-16 ENCOUNTER — Ambulatory Visit: Payer: BC Managed Care – PPO | Admitting: Neurology

## 2022-02-17 ENCOUNTER — Ambulatory Visit (INDEPENDENT_AMBULATORY_CARE_PROVIDER_SITE_OTHER): Payer: BC Managed Care – PPO | Admitting: Family Medicine

## 2022-02-17 ENCOUNTER — Encounter: Payer: Self-pay | Admitting: Family Medicine

## 2022-02-17 VITALS — BP 112/72 | HR 70 | Temp 97.7°F | Ht 72.0 in | Wt 226.4 lb

## 2022-02-17 DIAGNOSIS — K589 Irritable bowel syndrome without diarrhea: Secondary | ICD-10-CM

## 2022-02-17 DIAGNOSIS — G720 Drug-induced myopathy: Secondary | ICD-10-CM | POA: Diagnosis not present

## 2022-02-17 DIAGNOSIS — R413 Other amnesia: Secondary | ICD-10-CM

## 2022-02-17 DIAGNOSIS — N401 Enlarged prostate with lower urinary tract symptoms: Secondary | ICD-10-CM | POA: Insufficient documentation

## 2022-02-17 DIAGNOSIS — G4733 Obstructive sleep apnea (adult) (pediatric): Secondary | ICD-10-CM

## 2022-02-17 DIAGNOSIS — K3184 Gastroparesis: Secondary | ICD-10-CM

## 2022-02-17 DIAGNOSIS — E785 Hyperlipidemia, unspecified: Secondary | ICD-10-CM

## 2022-02-17 DIAGNOSIS — M545 Low back pain, unspecified: Secondary | ICD-10-CM

## 2022-02-17 DIAGNOSIS — K219 Gastro-esophageal reflux disease without esophagitis: Secondary | ICD-10-CM | POA: Diagnosis not present

## 2022-02-17 DIAGNOSIS — R351 Nocturia: Secondary | ICD-10-CM

## 2022-02-17 DIAGNOSIS — G8929 Other chronic pain: Secondary | ICD-10-CM | POA: Insufficient documentation

## 2022-02-17 DIAGNOSIS — G43709 Chronic migraine without aura, not intractable, without status migrainosus: Secondary | ICD-10-CM

## 2022-02-17 DIAGNOSIS — F411 Generalized anxiety disorder: Secondary | ICD-10-CM

## 2022-02-17 DIAGNOSIS — E039 Hypothyroidism, unspecified: Secondary | ICD-10-CM

## 2022-02-17 MED ORDER — ZOLPIDEM TARTRATE ER 12.5 MG PO TBCR: 12.5000 mg | EXTENDED_RELEASE_TABLET | Freq: Every day | ORAL | 1 refills | Status: DC

## 2022-02-17 NOTE — Patient Instructions (Addendum)
Sign release of information at the check out desk for [facility] records- primary and GI.  Recommended follow up: Return in about 1 month (around 03/20/2022) for followup or sooner if needed.Schedule b4 you leave.  - also schedule physical in 4-6 months

## 2022-02-17 NOTE — Assessment & Plan Note (Signed)
#  BPH S: Medication: Tamsulosin 0.4 mg - has been helpful-started by prior PCP A/P: stable- continue current medicines

## 2022-02-17 NOTE — Assessment & Plan Note (Signed)
#  Migraines-follows with neurology S: Medication: Relpax as needed, Ajovy for prevention as well as Topamax 200 mg - Did not tolerate attempted wean off Topamax which he was concerned was affecting his memory A/P: tolerable- continue current medications and neurology follow up

## 2022-02-17 NOTE — Assessment & Plan Note (Signed)
#  GERD- follows with Dr. Watt Climes S:Medication: Dexilant 60 mg every other morning, omeprazole 40mg  every other morning- but every night with both takes omeprazole 40 mg  -multiple EGDs  A/P: Complex regimen and still with less than ideal control-recommended continued follow-up with GI

## 2022-02-17 NOTE — Assessment & Plan Note (Signed)
#  Chronic back pain S: medication: hydrocodone 10-325 per primary care doctor Dr. Alyson Ingles- 60 tablets lasts 90 days - usues prn with activity  (stable on this dose/frequency roughly 5 years) - does not use at bedtime- and always separates by 8 hours from the [place] - Dr. Micheline Chapman manages epidural injections about 7-8 months -also with greater trochanteric bursitis issues -has had left knee replacement- has done well -allows him to be functional and do things like play golf  MRI lumbar spine 01/10/2020- mild multilvel spondylosis. No significant spinal canal narrowing, mild multilevel neural foraminal narrowing. Multiple small disc bulges.  -has never seen pain management  He reports at least 30 hydrocodone left from last rx A/P: ongoing chronic back pain on chronic narcotics but thankfully with stability in dosing over last 5 years -we discussed cannot prescribe on first visit per [place] policy (but he is also not in need) -he agrees to 1-2 month follow up before he needs refills - Will need urine drug screen and controlled substance contract - Discussed risks of medication for unintentional overdose particularly in combination with Ambien and alprazolam-thankfully only using 3 alprazolam per week and #60 hydrocodone last for 3 months typically - Also discussed would need visit each time he got a refill per standard [facility] policy - Also continue follow-up as needed with sports medicine for epidurals given benefit

## 2022-02-17 NOTE — Assessment & Plan Note (Signed)
#  memory loss- workup with neurology ongoing January 2024- we also discussed that alprazolam and Lorrin Mais are linked to memory loss

## 2022-02-17 NOTE — Progress Notes (Signed)
Phone: [phone]   Subjective:  Patient presents today to establish care and for annual physical.  Prior patient of Dr. Alyson Ingles with [facility].  Chief Complaint  Patient presents with   chronic back pain   See problem oriented charting- ROS- full  review of systems was completed and negative  except for: migraines still, allergies, back pain  The following were reviewed and entered/updated in epic: Past Medical History:  Diagnosis Date   Allergic rhinitis due to dust    Anxiety    BPH (benign prostatic hyperplasia)    Bulging lumbar disc    Concussion    Deviated septum    Dyshidrotic eczema    Gastroesophageal reflux    Hypothyroidism    IBS (irritable bowel syndrome)    Insomnia    Low back pain    Migraines    Nephrolithiasis    Neuropathy    Onychomycosis    OSA (obstructive sleep apnea)    Osteopenia    Peripheral neuropathy    Rheumatic fever    67 y.o.- no long term issues   Seborrheic keratosis    Shingles    Patient Active Problem List   Diagnosis Date Noted   Chronic back pain on long term narcotics 02/17/2022    Priority: High   Drug-induced myopathy 02/17/2022    Priority: Medium    Hypothyroidism 02/17/2022    Priority: Medium    BPH associated with nocturia 02/17/2022    Priority: Medium    OSA on CPAP 02/17/2022    Priority: Medium    Memory change 02/07/2022    Priority: Medium    Chronic migraine without aura without status migrainosus, not intractable 05/12/2020    Priority: Medium    Hyperlipidemia, unspecified 04/20/2009    Priority: Medium    Anxiety state 04/20/2009    Priority: Medium    GERD 03/10/2009    Priority: Medium    Irritable bowel syndrome 03/10/2009    Priority: Medium    Other allergic rhinitis 04/24/2019    Priority: Low   Polyneuropathy 11/13/2018    Priority: Low   Gastroparesis 04/07/2009    Priority: Low   Pain in joint, shoulder region 08/19/2014    Priority: 1.   Left knee pain 04/30/2013    Priority: 1.    Plantar fasciitis 03/06/2013    Priority: 1.   Past Surgical History:  Procedure Laterality Date   arthroscopic knee surgery Left    COLONOSCOPY     REPLACEMENT TOTAL KNEE Left    SEPTOPLASTY     TENNIS ELBOW RELEASE/NIRSCHEL PROCEDURE     VASECTOMY      Family History  Problem Relation Age of Onset   High blood pressure Mother    Lung cancer Mother        67   Neuropathy Mother    Gout Mother    Alzheimer's disease Father        severe dementia   High Cholesterol Father    Gout Father    Other Father        back problems   Alcoholism Brother    Migraines Other        strong maternal fam hx     Medications- reviewed and updated Current Outpatient Medications  Medication Sig Dispense Refill   ALLEGRA-D ALLERGY & CONGESTION 180-240 MG 24 hr tablet Take 1 tablet by mouth daily.   3   ALPRAZolam (XANAX) 0.5 MG tablet 0.5-1 tablet by mouth as needed.  aspirin-acetaminophen-caffeine (EXCEDRIN MIGRAINE) 250-250-65 MG tablet Take by mouth every 6 (six) hours as needed for headache.     desoximetasone (TOPICORT) 0.25 % cream Apply 1 application topically daily as needed.     DEXILANT 60 MG capsule Take 60 mg by mouth every other day.     docusate sodium (COLACE) 100 MG capsule Take 100 mg by mouth daily.      eletriptan (RELPAX) 40 MG tablet Take 1 tablet (40 mg total) by mouth every 2 (two) hours as needed for migraine or headache. Maximum twice daily. 42 tablet 3   fluticasone (CUTIVATE) 0.005 % ointment Apply 1 application topically 2 (two) times daily.     fluticasone (FLONASE) 50 MCG/ACT nasal spray Place 2 sprays into both nostrils daily. 48 g 3   Fremanezumab-vfrm (AJOVY) 225 MG/1.5ML SOAJ Inject 225 mg into the skin every 30 (thirty) days. 4.5 mL 3   HYDROcodone-acetaminophen (NORCO) 10-325 MG tablet Take 1 tablet by mouth as needed.      ibuprofen (ADVIL,MOTRIN) 600 MG tablet Take 600 mg by mouth as needed.     ipratropium (ATROVENT) 0.06 % nasal spray 1 spray in  each nostril 2 times daily 45 mL 3   NAFTIN 2 % CREA as needed.     omeprazole (PRILOSEC) 40 MG capsule Take 1-2 tablets by mouth at bedtime.      POTASSIUM PO Take 99 mg by mouth.      SYNTHROID 112 MCG tablet Take 112 mcg by mouth daily before breakfast.     tamsulosin (FLOMAX) 0.4 MG CAPS capsule 1 capsule 30 minutes after the same meal each day     topiramate (TOPAMAX) 200 MG tablet Take 1 tablet (200 mg total) by mouth daily. 90 tablet 4   triamcinolone ointment (KENALOG) 0.1 % as needed.     WELCHOL 625 MG tablet Take 3 tablets by mouth Twice daily.     zolpidem (AMBIEN CR) 12.5 MG CR tablet Take 1 tablet (12.5 mg total) by mouth at bedtime. Do not take hydrocodone or alprazolam within 8 hours of this medication. Do not drive for 8 hours after taking this medication 90 tablet 1   No current facility-administered medications for this visit.    Allergies-reviewed and updated Allergies  Allergen Reactions   Gabapentin    Oxycodone Itching, Nausea And Vomiting and Nausea Only   Penicillins     Reaction occurred as an infant; parents told him never to take PCN   Terbinafine And Related Rash    All over body    Social History   Social History Narrative   Lives at home with his wife. 2 children from prior marriage. 6 grandkids.       IT consultant at [employer]   Objective  Objective:  BP 112/72   Pulse 70   Temp 97.7 F (36.5 C)   Ht 6' (1.829 m)   Wt 226 lb 6.4 oz (102.7 kg)   SpO2 99%   BMI 30.71 kg/m  Gen: NAD, resting comfortably HEENT: Mucous membranes are moist. Oropharynx normal Neck: no thyromegaly CV: RRR no murmurs rubs or gallops Lungs: CTAB no crackles, wheeze, rhonchi Abdomen: soft/nontender/nondistended/normal bowel sounds. No rebound or guarding.  Ext: no edema Skin: warm, dry Neuro: grossly normal, moves all extremities, PERRLA MSK: Right great IP joint pain   Assessment and Plan   # Transferring care from [facility]-we will  attempt to get colonoscopy records from GI as well as records from PCP -  Declines HIV and hepatitis C screening for now and we will see if he has had Shingrix and tetanus - We also need to determine when his last physical was  #Chronic back pain S: medication: hydrocodone 10-325 per primary care doctor Dr. [name]- 60 tablets lasts 90 days - usues prn with activity  (stable on this dose/frequency roughly 5 years) - does not use at bedtime- and always separates by 8 hours from the [place] - Dr. Margaretha Sheffield manages epidural injections about 7-8 months -also with greater trochanteric bursitis issues -has had left knee replacement- has done well -allows him to be functional and do things like play golf  MRI lumbar spine 01/10/2020- mild multilvel spondylosis. No significant spinal canal narrowing, mild multilevel neural foraminal narrowing. Multiple small disc bulges.  -has never seen pain management  He reports at least 30 hydrocodone left from last rx A/P: ongoing chronic back pain on chronic narcotics but thankfully with stability in dosing over last 5 years -we discussed cannot prescribe on first visit per [place] policy (but he is also not in need) -he agrees to 1-2 month follow up before he needs refills - Will need urine drug screen and controlled substance contract - Discussed risks of medication for unintentional overdose particularly in combination with Ambien and alprazolam-thankfully only using 3 alprazolam per week and #60 hydrocodone last for 3 months typically - Also discussed would need visit each time he got a refill per standard [place] policy - Also continue follow-up as needed with sports medicine for epidurals given benefit  #hyperlipidemia #IBS S: Medication:WelChol 625 mg 3 tablets twice daily Per prior PCP -didn't tolerate statins due to myalgia -ended up on welchol and had essential resolution of IBS A/P: Hopefully stable-we will get a copy of lipids from last PCP -  [place] IBS appears stable-continue current medication   #hypothyroidism- follows with Dr. Sharl Ma due to oscillations-patient specifically requested endocrine consult S: compliant On thyroid medication-Synthroid 224 mcg A/P:stable on last check with him- continue current medications   # BPH S: Medication: Tamsulosin 0.4 mg - has been helpful A/P: stable- continue current medicines     # Migraines-follows with neurology S: Medication: Relpax as needed, Ajovy for prevention as well as Topamax 200 mg A/P: tolerable- continue current medications and neurology follow up     # Anxiety/insomnia S:Medication: alprazolam 0.5-1 mg max 3 a week,  Ambien 12.5 mg extended release -reports high stress job -antidepressants/SSRI in past- severe libido decrease, range of emotion restriction Counseling: helpful in the past with divorce A/P: overall stable- does not need refill on alprazolam- could consider longer acting agent (problem for me would then be overlap with ambien and or hydrocodone), did refill ambien but discussed at age 52 considering 6.25 mg OR trazodone   # GERD- follows with Dr. Ewing Schlein S:Medication: Dexilant 60 mg every other morning, omeprazole 40mg  every other morning- but every night with both takes omeprazole 40 mg  -multiple EGDs  A/P: Complex regimen and still with less than ideal control-recommended continued follow-up with GI    #memory loss- workup with neurology ongoing January 2024- we also discussed that alprazolam and February 2024 are linked to memory loss   # ED visit 01/31/22- significant improvement for ongoing 2-3 week nasal congestion with keflex- prior lack of improvement on x-pak, prednisone through an urgent care. Reassuring x-ray in emergency room -penicillin allergy but tolerates cephalopsorins  #OSA_ on cpap-patient reports compliance   #former smoker quit 2008-  Lung cancer screening program-just outside of 15  years from quitting  #Left foot great toe IP  pain-suspected arthritis-recommended Voltaren gel or could discuss with Dr. Margaretha Sheffield at next visit  Recommended follow up: Return in about 1 month (around 03/20/2022) for followup or sooner if needed.Schedule b4 you leave. Future Appointments  Date Time Provider Department Center  04/20/2022  3:20 PM Shelva Majestic, MD [facility]  06/05/2022  3:30 PM [name], MD [facility] None  06/16/2022  3:20 PM Shelva Majestic, MD [facility]   Lab/Order associations:   ICD-10-CM   1. Drug-induced myopathy  G72.0     2. Irritable bowel syndrome, unspecified type  K58.9     3. Gastroesophageal reflux disease, unspecified whether esophagitis present  K21.9     4. Gastroparesis  K31.84     5. Anxiety state  F41.1     6. Chronic low back pain, unspecified back pain laterality, unspecified whether sciatica present  M54.50    G89.29     7. Hyperlipidemia, unspecified hyperlipidemia type  E78.5     8. Hypothyroidism, unspecified type  E03.9     9. BPH associated with nocturia  N40.1    R35.1     10. Chronic migraine without aura without status migrainosus, not intractable  G43.709     11. Memory change  R41.3     12. OSA on CPAP  G47.33       Meds ordered this encounter  Medications   zolpidem (AMBIEN CR) 12.5 MG CR tablet    Sig: Take 1 tablet (12.5 mg total) by mouth at bedtime. Do not take hydrocodone or alprazolam within 8 hours of this medication. Do not drive for 8 hours after taking this medication    Dispense:  90 tablet    Refill:  1    Time Spent: 70 minutes of total time (3:15 PM-4:15 PM, 8:10 PM-8:20 PM) was spent on the date of the encounter performing the following actions: chart review prior to seeing the patient, obtaining history, performing a medically necessary exam, counseling on the treatment plan as well as my concerns on side effects particularly on narcotics with benzodiazepines with him on hypnotics like Ambien but also wanting to control his pain and other  symptoms, placing orders, and documenting in our EHR.    Return precautions advised.  Tana Conch, MD

## 2022-02-17 NOTE — Assessment & Plan Note (Signed)
#  hyperlipidemia #IBS S: Medication:WelChol 625 mg 3 tablets twice daily Per prior PCP -didn't tolerate statins due to myalgia -ended up on welchol and had essential resolution of IBS A/P: Hopefully stable-we will get a copy of lipids from last PCP - [place] IBS appears stable-continue current medication

## 2022-02-17 NOTE — Assessment & Plan Note (Signed)
Improved on welchol-basically reports resolution when he was placed on this for cholesterol

## 2022-02-17 NOTE — Assessment & Plan Note (Signed)
#  hypothyroidism- follows with Dr. Buddy Duty due to oscillations-patient specifically requested S: compliant On thyroid medication-Synthroid 224 mcg A/P:stable on last check with him- continue current medications

## 2022-02-26 DIAGNOSIS — G4733 Obstructive sleep apnea (adult) (pediatric): Secondary | ICD-10-CM | POA: Diagnosis not present

## 2022-02-27 DIAGNOSIS — G4733 Obstructive sleep apnea (adult) (pediatric): Secondary | ICD-10-CM | POA: Diagnosis not present

## 2022-04-16 DIAGNOSIS — G4733 Obstructive sleep apnea (adult) (pediatric): Secondary | ICD-10-CM | POA: Diagnosis not present

## 2022-04-19 DIAGNOSIS — L814 Other melanin hyperpigmentation: Secondary | ICD-10-CM | POA: Diagnosis not present

## 2022-04-19 DIAGNOSIS — D225 Melanocytic nevi of trunk: Secondary | ICD-10-CM | POA: Diagnosis not present

## 2022-04-19 DIAGNOSIS — L570 Actinic keratosis: Secondary | ICD-10-CM | POA: Diagnosis not present

## 2022-04-19 DIAGNOSIS — B351 Tinea unguium: Secondary | ICD-10-CM | POA: Diagnosis not present

## 2022-04-19 DIAGNOSIS — L821 Other seborrheic keratosis: Secondary | ICD-10-CM | POA: Diagnosis not present

## 2022-04-20 ENCOUNTER — Ambulatory Visit: Payer: BC Managed Care – PPO | Admitting: Family Medicine

## 2022-04-20 DIAGNOSIS — M7062 Trochanteric bursitis, left hip: Secondary | ICD-10-CM | POA: Diagnosis not present

## 2022-04-20 DIAGNOSIS — M25551 Pain in right hip: Secondary | ICD-10-CM | POA: Diagnosis not present

## 2022-04-20 DIAGNOSIS — M7061 Trochanteric bursitis, right hip: Secondary | ICD-10-CM | POA: Diagnosis not present

## 2022-05-01 ENCOUNTER — Encounter: Payer: Self-pay | Admitting: Family Medicine

## 2022-05-01 ENCOUNTER — Ambulatory Visit (INDEPENDENT_AMBULATORY_CARE_PROVIDER_SITE_OTHER): Payer: BC Managed Care – PPO | Admitting: Family Medicine

## 2022-05-01 VITALS — BP 122/80 | HR 71 | Temp 98.0°F | Ht 72.0 in | Wt 223.4 lb

## 2022-05-01 DIAGNOSIS — K589 Irritable bowel syndrome without diarrhea: Secondary | ICD-10-CM

## 2022-05-01 DIAGNOSIS — R413 Other amnesia: Secondary | ICD-10-CM

## 2022-05-01 DIAGNOSIS — M545 Low back pain, unspecified: Secondary | ICD-10-CM | POA: Diagnosis not present

## 2022-05-01 DIAGNOSIS — Z23 Encounter for immunization: Secondary | ICD-10-CM

## 2022-05-01 DIAGNOSIS — F411 Generalized anxiety disorder: Secondary | ICD-10-CM

## 2022-05-01 DIAGNOSIS — E785 Hyperlipidemia, unspecified: Secondary | ICD-10-CM | POA: Diagnosis not present

## 2022-05-01 DIAGNOSIS — G8929 Other chronic pain: Secondary | ICD-10-CM

## 2022-05-01 MED ORDER — IBUPROFEN 800 MG PO TABS: 800.0000 mg | ORAL_TABLET | Freq: Two times a day (BID) | ORAL | 3 refills | Status: DC | PRN

## 2022-05-01 MED ORDER — TRAZODONE HCL 50 MG PO TABS: 25.0000 mg | ORAL_TABLET | Freq: Every evening | ORAL | 3 refills | Status: DC | PRN

## 2022-05-01 MED ORDER — HYDROCODONE-ACETAMINOPHEN 10-325 MG PO TABS: 1.0000 | ORAL_TABLET | Freq: Two times a day (BID) | ORAL | 0 refills | Status: DC | PRN

## 2022-05-01 NOTE — Progress Notes (Signed)
Phone [phone] In person visit   Subjective:   Joseph Fuentes is a 67 y.o. year old very pleasant male patient who presents for/with See problem oriented charting Chief Complaint  Patient presents with   Follow-up    Pt is here for 1 month f/u for drug induced myopathy.   Past Medical History-  Patient Active Problem List   Diagnosis Date Noted   Chronic back pain on long term narcotics 02/17/2022    Priority: High   Drug-induced myopathy 02/17/2022    Priority: Medium    Hypothyroidism 02/17/2022    Priority: Medium    BPH associated with nocturia 02/17/2022    Priority: Medium    OSA on CPAP 02/17/2022    Priority: Medium    Memory change 02/07/2022    Priority: Medium    Chronic migraine without aura without status migrainosus, not intractable 05/12/2020    Priority: Medium    Hyperlipidemia, unspecified 04/20/2009    Priority: Medium    Anxiety state 04/20/2009    Priority: Medium    GERD 03/10/2009    Priority: Medium    Irritable bowel syndrome 03/10/2009    Priority: Medium    Other allergic rhinitis 04/24/2019    Priority: Low   Polyneuropathy 11/13/2018    Priority: Low   Gastroparesis 04/07/2009    Priority: Low   Pain in joint, shoulder region 08/19/2014    Priority: 1.   Left knee pain 04/30/2013    Priority: 1.   Plantar fasciitis 03/06/2013    Priority: 1.    Medications- reviewed and updated Current Outpatient Medications  Medication Sig Dispense Refill   ALLEGRA-D ALLERGY & CONGESTION 180-240 MG 24 hr tablet Take 1 tablet by mouth daily.   3   aspirin-acetaminophen-caffeine (EXCEDRIN MIGRAINE) 250-250-65 MG tablet Take by mouth every 6 (six) hours as needed for headache.     desoximetasone (TOPICORT) 0.25 % cream Apply 1 application topically daily as needed.     DEXILANT 60 MG capsule Take 60 mg by mouth every other day.     docusate sodium (COLACE) 100 MG capsule Take 100 mg by mouth daily.      eletriptan (RELPAX) 40 MG tablet Take 1  tablet (40 mg total) by mouth every 2 (two) hours as needed for migraine or headache. Maximum twice daily. 42 tablet 3   fluticasone (CUTIVATE) 0.005 % ointment Apply 1 application topically 2 (two) times daily.     fluticasone (FLONASE) 50 MCG/ACT nasal spray Place 2 sprays into both nostrils daily. 48 g 3   HYDROcodone-acetaminophen (NORCO) 10-325 MG tablet Take 1 tablet by mouth as needed.      ibuprofen (ADVIL,MOTRIN) 600 MG tablet Take 600 mg by mouth as needed.     ipratropium (ATROVENT) 0.06 % nasal spray 1 spray in each nostril 2 times daily 45 mL 3   NAFTIN 2 % CREA as needed.     omeprazole (PRILOSEC) 40 MG capsule Take 1-2 tablets by mouth at bedtime.      POTASSIUM PO Take 99 mg by mouth.      SYNTHROID 112 MCG tablet Take 112 mcg by mouth daily before breakfast.     tamsulosin (FLOMAX) 0.4 MG CAPS capsule 1 capsule 30 minutes after the same meal each day     topiramate (TOPAMAX) 200 MG tablet Take 1 tablet (200 mg total) by mouth daily. 90 tablet 4   triamcinolone ointment (KENALOG) 0.1 % as needed.     WELCHOL 625 MG tablet Take  3 tablets by mouth Twice daily.     No current facility-administered medications for this visit.     Objective:  BP 122/80   Pulse 71   Temp 98 F (36.7 C)   Ht 6' (1.829 m)   Wt 223 lb 6.4 oz (101.3 kg)   SpO2 97%   BMI 30.30 kg/m  Gen: NAD, resting comfortably CV: RRR no murmurs rubs or gallops Lungs: CTAB no crackles, wheeze, rhonchi Ext: trace edema Skin: warm, dry     Assessment and Plan   #memory loss- workup with neurology ongoing January 2024- we also discussed that alprazolam and ambien are linked to memory loss at last visit- he decided to stop medication as result-reports some improvement in memory he believes- plus he was aware of increased risks of opiates and ambien or benzo combo -On the other hand without these medication- wakes up in pain with hip and back and hard getting back to sleep- was feeling groggy- wondered if  affecting memory.  -trazodone trial discussed today and he is willing  #Chronic back pain/ more recent hip pain from  S: medication: hydrocodone 10-325 per primary care doctor Dr. Alyson Ingles- 60 tablets lasts 90 days - usues prn with activity primarily once a day.  -Back pain has been long term issue but more recently dealing with hip pain as well -also with hip pain- saw Dr. Elmyra Ricks about his hip- cannot get it until may and wants to hold off until August. Did get recent shot- usually lasts 7-8 months. Apperas to have gluteus medium surgery and trochanteric bursectomy planned. Voltaren helping substantially thankfully- cream - if he does have to have surgery- able to climb 2 flights of stairs without chest pain - no significant shortness of breath with it either- able to walk hills with golf with no issue other than orthopaedic pain (can complete 4 mets)   -ibuprofen 800mg  one or twice a day typically- needs refill. Reports had labs within 6 months  -also gets cramping in bilateral legs. Denies dehydration- urine reports clear. 3 gatorades was not helpful. Takes potasium 99 mg for leg cramps. Took mv with iron in past but was interacting with thyroid medicine. Magnesium levels were ok- worse after golf days A/P: For chronic back pain-does very well with intermittent hydrocodone-refilled #60 to last at least 90 days-if had worsening pain pattern consider pain management referral - Thrilled he was able to stop alprazolam and Ambien which would be high risk in combination with hydrocodone -Refilled ibuprofen and he agrees to updating blood work at next visit particularly CMP -For cramping issues discussed checking electrolytes and magnesium-he may trial mustard or pickle juice but does not like the taste of either  #hyperlipidemia #IBS S: Medication:WelChol 625 mg 3 tablets twice daily -didn't tolerate statins due to myalgia -ended up on welchol and had essential resolution of IBS A/P: WelChol helping  IBS-continue current medication-still awaiting records from [facility]-if do not have by next visit may need to sign a release of information again-suspect well-controlled-continue current medication   # Anxiety/insomnia S:Medication: alprazolam,  Ambien 12.5 mg extended release in the past -antidepressants/SSRI in past- severe libido decrease, range of emotion restriction A/P: Sleep is worsening off of Ambien-we opted to trial trazodone which is a safer alternative given long-term opiate use  Recommended follow up: Return in about 3 months (around 07/31/2022) for followup or sooner if needed.Schedule b4 you leave. Future Appointments  Date Time Provider [place]  06/05/2022  3:30 PM Jaynee Eagles,  Larina Bras, MD [facility] None  06/16/2022  3:20 PM Marin Olp, MD [facility]  08/31/2022  3:20 PM Yong Channel Brayton Mars, MD [facility]    Lab/Order associations:   ICD-10-CM   1. Chronic low back pain, unspecified back pain laterality, unspecified whether sciatica present  M54.50    G89.29     2. Need for Tdap vaccination  Z23 Tdap vaccine greater than or equal to 7yo IM    3. Anxiety state  F41.1     4. Hyperlipidemia, unspecified hyperlipidemia type  E78.5     5. Irritable bowel syndrome, unspecified type  K58.9     6. Memory change  R41.3       Meds ordered this encounter  Medications   traZODone (DESYREL) 50 MG tablet    Sig: Take 0.5-1 tablets (25-50 mg total) by mouth at bedtime as needed for sleep.    Dispense:  30 tablet    Refill:  3   HYDROcodone-acetaminophen (NORCO) 10-325 MG tablet    Sig: Take 1 tablet by mouth 2 (two) times daily as needed (do not drive for 8 hours after taking).    Dispense:  60 tablet    Refill:  0   ibuprofen (ADVIL) 800 MG tablet    Sig: Take 1 tablet (800 mg total) by mouth 2 (two) times daily as needed.    Dispense:  180 tablet    Refill:  3    Return precautions advised.  Garret Reddish, MD

## 2022-05-01 NOTE — Patient Instructions (Addendum)
Sign release of information at the check out desk for colonoscopy from [facility] Dr. Watt Climes  Will hold off on pneumonia vaccine since received TDAP today.  Refilled hydrocodone today with plan for this to ideally last 3 months- need follow up appointment for next refill  Recommended follow up: Return in about 3 months (around 07/31/2022) for followup or sooner if needed.Schedule b4 you leave.

## 2022-05-10 DIAGNOSIS — R1310 Dysphagia, unspecified: Secondary | ICD-10-CM | POA: Diagnosis not present

## 2022-05-10 DIAGNOSIS — K219 Gastro-esophageal reflux disease without esophagitis: Secondary | ICD-10-CM | POA: Diagnosis not present

## 2022-05-10 DIAGNOSIS — K317 Polyp of stomach and duodenum: Secondary | ICD-10-CM | POA: Diagnosis not present

## 2022-05-17 DIAGNOSIS — G4733 Obstructive sleep apnea (adult) (pediatric): Secondary | ICD-10-CM | POA: Diagnosis not present

## 2022-05-23 DIAGNOSIS — N401 Enlarged prostate with lower urinary tract symptoms: Secondary | ICD-10-CM | POA: Diagnosis not present

## 2022-05-23 DIAGNOSIS — N200 Calculus of kidney: Secondary | ICD-10-CM | POA: Diagnosis not present

## 2022-05-23 DIAGNOSIS — R351 Nocturia: Secondary | ICD-10-CM | POA: Diagnosis not present

## 2022-05-24 ENCOUNTER — Other Ambulatory Visit: Payer: Self-pay | Admitting: Family Medicine

## 2022-05-29 ENCOUNTER — Telehealth: Payer: Self-pay | Admitting: Family Medicine

## 2022-05-29 NOTE — Telephone Encounter (Signed)
Pt states insurance needs prior authorization for  HYDROcodone-acetaminophen (NORCO) 10-325 MG tablet  Please advise.

## 2022-05-30 NOTE — Telephone Encounter (Signed)
See below, PA needed.

## 2022-05-31 ENCOUNTER — Other Ambulatory Visit (HOSPITAL_COMMUNITY): Payer: Self-pay

## 2022-05-31 ENCOUNTER — Telehealth: Payer: Self-pay

## 2022-05-31 NOTE — Telephone Encounter (Signed)
Prior authorization for HYDROcodone-Acetaminophen 10-325MG  tablets submitted and APPROVED. Test billing returns $0.81 copay for 30 day supply.  Key [id number] Effective: 05/31/22 - 11/27/22

## 2022-05-31 NOTE — Telephone Encounter (Signed)
PA approved. Effective: 05/31/22 - 11/27/22

## 2022-06-05 ENCOUNTER — Ambulatory Visit (INDEPENDENT_AMBULATORY_CARE_PROVIDER_SITE_OTHER): Payer: BC Managed Care – PPO | Admitting: Neurology

## 2022-06-05 DIAGNOSIS — R413 Other amnesia: Secondary | ICD-10-CM

## 2022-06-05 DIAGNOSIS — R4189 Other symptoms and signs involving cognitive functions and awareness: Secondary | ICD-10-CM

## 2022-06-05 DIAGNOSIS — G43709 Chronic migraine without aura, not intractable, without status migrainosus: Secondary | ICD-10-CM

## 2022-06-05 DIAGNOSIS — F0390 Unspecified dementia without behavioral disturbance: Secondary | ICD-10-CM | POA: Diagnosis not present

## 2022-06-05 DIAGNOSIS — R410 Disorientation, unspecified: Secondary | ICD-10-CM

## 2022-06-05 DIAGNOSIS — G309 Alzheimer's disease, unspecified: Secondary | ICD-10-CM

## 2022-06-05 DIAGNOSIS — R41844 Frontal lobe and executive function deficit: Secondary | ICD-10-CM

## 2022-06-05 NOTE — Progress Notes (Addendum)
Patient: Joseph Fuentes Date of Birth: 1955/09/22  Virtual Visit via Telephone Note  I connected with Joseph Fuentes on 06/05/22 at  3:30 PM EDT by telephone and verified that I am speaking with the correct person using two identifiers.  Location: Patient: [address] Provider: home   I discussed the limitations, risks, security and privacy concerns of performing an evaluation and management service by telephone and the availability of in person appointments. I also discussed with the patient that there may be a patient responsible charge related to this service. The patient expressed understanding and agreed to proceed.   Follow Up Instructions:    I discussed the assessment and treatment plan with the patient. The patient was provided an opportunity to ask questions and all were answered. The patient agreed with the plan and demonstrated an understanding of the instructions.   The patient was advised to call back or seek an in-person evaluation if the symptoms worsen or if the condition fails to improve as anticipated.  I provided 20 minutes of non-face-to-face time during this encounter.   Joseph Fret, MD   Reason for Visit: Follow up History from: Patient, patient  Primary Neurologist: Lucia Gaskins   HISTORY OF PRESENT ILLNESS:  Patient is here today and still complaining of memory changes.  He insists there is something wrong.  He cannot multitask, he has short-term memory loss, he has difficulty remembering people's names, he has been complaining of this for the last year, he saw my nurse practitioner in January of this year and she recommended testing which he could not complete.  At this time given he is continuing to complain of memory changes and cognitive decline I will order again of the neuropsych testing, MRI of the brain, will also add in several lab labs.  Patient complains of symptoms per HPI as well as the following symptoms: memory loss . Pertinent negatives and  positives per HPI. All others negative   Today 02/07/22 Here today to discuss worsening headaches. Review of my chart message 12/27/21 decreased Topamax  down to 100 mg daily due to memory concerns, no worsening in headaches.01/02/22 stopped Topamax completely almost immediate return of headache. 01/31/22 urgent care for sinusitis, given cephalexin, 12/26 he took z-pack and prednisone. Has virtually had a migraine daily since. In rebound headache, taking daily Relpax. 4 days ago he stopped the Relpax, instead lay down in dark room with ice pack. Yesterday he had no headache. Still on Ajovy, taking Topamax 200 mg in AM (has always done in AM). Has been on Topamax for 15 years. Has tried Nurtec didn't help at all, took [place] once it may have helped, he has samples of both at home.   Also, wants to discuss memory troubles which is why he tried to initially come off the Topamax to start. Trouble staying focused. He is a Engineer, civil (consulting). His mind need to stay on task, has done for 40 years, last few years more trouble focusing, in other aspects of his life. Lose train of thought, driving forget where he is going, can cause panic attack. Gathers thoughts comes back. Pays bills, lets them sit long, because he avoids, is a bigger job then. Wife feels more forgetful, she thinks related to age. Is under a lot of work related stress. His wife lost her job in the summer, just got a new one. He is having to learn a new task at work. Planning to retire in 2 years. Wears CPAP nightly. Lately sleeping well  due to taking cold medication, otherwise has chronic pain that is under good control after recent injections. He noticed no change in the memory after reducing/stopping the Topamax.   HISTORY  06/22/2021: could not connect via video, we spoke on the phone briefly, [name] < 5 minutes refill meds see in one year.   05/12/2020: He was miserable with the headaches. He started ajovy and stopped chocolate and he fels better.  The nurtec helps a little. 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 We discussed other options for acute management, we can try Ubrelvy   Patient complains of symptoms per HPI as well as the following symptoms: headache much improved, sleep apnea compliant on cpap. Pertinent negatives and positives per HPI. All others negative     [name]: 02/03/20  Mr. Fuentes is a very pleasant 67 year old male with long history of migraine headaches.  Previously, on Topamax 200 mg daily, he reported memory loss.  In the past, he started Emgality, for 3 months, weaned dose of Topamax, Emgality was not helpful, ultimately Topamax was increased back to 200 mg twice a day.  He was going to try Aimovig, never did.  For quite a while, his headaches have been fairly well controlled.  However, in the past 2 to 3 months, has had increase in frequency of headache, almost daily.  He got into a cycle of rebound headache, taking frequent Relpax, ibuprofen, Excedrin.  Migraines, having typical presentation, starts at the right cheek, radiates to the right temple, can be associated with right eye drooping, with migraine features.  The only change, is the last several months, he may have 2-3 migraines in 1 day.  As he has started to break away from the rebound cycle, headaches have improved.  He has not had a migraine in the last 2 days.  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 Currently remains on Topamax 200 mg daily.  Takes hydrocodone for chronic back pain.  Presents today for evaluation accompanied by his wife. Headaches have been so bad, he had to walk [place] the golf [place]. Not interested in Botox.     REVIEW OF SYSTEMS: Out of a complete 14 system review of symptoms, the patient complains only of the following symptoms, and all other reviewed systems are negative.  See HPI  Physical  exam: Exam: Gen: NAD, conversant,  Neuro: Detailed Neurologic Exam  Speech:    Speech is normal; fluent and spontaneous with normal comprehension.  Cognition:    The patient is oriented to person, place, and time;     recent and remote memory intact;     language fluent;     normal attention, concentration,     fund of knowledge    02/07/2022    8:35 AM  MMSE - Mini Mental State Exam  Orientation to time 5  Orientation to Place 5  Registration 3  Attention/ Calculation 5  Recall 3  Language- name 2 objects 2  Language- repeat 1  Language- follow 3 step command 3  Language- read & follow direction 1  Write a sentence 1  Copy design 1  Total score 30     ALLERGIES: Allergies  Allergen Reactions   Gabapentin    Oxycodone Itching, Nausea And Vomiting and Nausea Only   Penicillins     Reaction occurred as an infant; parents told him never to take PCN  Terbinafine And Related Rash    All over body    HOME MEDICATIONS: Outpatient Medications Prior to Visit  Medication Sig Dispense Refill   ALLEGRA-D ALLERGY & CONGESTION 180-240 MG 24 hr tablet Take 1 tablet by mouth daily.   3   aspirin-acetaminophen-caffeine (EXCEDRIN MIGRAINE) 250-250-65 MG tablet Take by mouth every 6 (six) hours as needed for headache.     desoximetasone (TOPICORT) 0.25 % cream Apply 1 application topically daily as needed.     DEXILANT 60 MG capsule Take 60 mg by mouth every other day.     docusate sodium (COLACE) 100 MG capsule Take 100 mg by mouth daily.      eletriptan (RELPAX) 40 MG tablet Take 1 tablet (40 mg total) by mouth every 2 (two) hours as needed for migraine or headache. Maximum twice daily. 42 tablet 3   fluticasone (CUTIVATE) 0.005 % ointment Apply 1 application topically 2 (two) times daily.     fluticasone (FLONASE) 50 MCG/ACT nasal spray Place 2 sprays into both nostrils daily. 48 g 3   HYDROcodone-acetaminophen (NORCO) 10-325 MG tablet Take 1 tablet by mouth 2 (two) times daily  as needed (do not drive for 8 hours after taking). 60 tablet 0   ibuprofen (ADVIL) 800 MG tablet Take 1 tablet (800 mg total) by mouth 2 (two) times daily as needed. 180 tablet 3   ipratropium (ATROVENT) 0.06 % nasal spray 1 spray in each nostril 2 times daily 45 mL 3   NAFTIN 2 % CREA as needed.     omeprazole (PRILOSEC) 40 MG capsule Take 1-2 tablets by mouth at bedtime.      POTASSIUM PO Take 99 mg by mouth.      SYNTHROID 112 MCG tablet Take 112 mcg by mouth daily before breakfast.     tamsulosin (FLOMAX) 0.4 MG CAPS capsule 1 capsule 30 minutes after the same meal each day     topiramate (TOPAMAX) 200 MG tablet Take 1 tablet (200 mg total) by mouth daily. 90 tablet 4   traZODone (DESYREL) 50 MG tablet TAKE 0.5-1 TABLETS BY MOUTH AT BEDTIME AS NEEDED FOR SLEEP. 90 tablet 2   triamcinolone ointment (KENALOG) 0.1 % as needed.     WELCHOL 625 MG tablet Take 3 tablets by mouth Twice daily.     No facility-administered medications prior to visit.    PAST MEDICAL HISTORY: Past Medical History:  Diagnosis Date   Allergic rhinitis due to dust    Anxiety    BPH (benign prostatic hyperplasia)    Bulging lumbar disc    Concussion    Deviated septum    Dyshidrotic eczema    Gastroesophageal reflux    Hypothyroidism    IBS (irritable bowel syndrome)    Insomnia    Low back pain    Migraines    Nephrolithiasis    Neuropathy    Onychomycosis    OSA (obstructive sleep apnea)    Osteopenia    Peripheral neuropathy    Rheumatic fever    67 y.o.- no long term issues   Seborrheic keratosis    Shingles     PAST SURGICAL HISTORY: Past Surgical History:  Procedure Laterality Date   arthroscopic knee surgery Left    COLONOSCOPY     REPLACEMENT TOTAL KNEE Left    SEPTOPLASTY     TENNIS ELBOW RELEASE/NIRSCHEL PROCEDURE     VASECTOMY      FAMILY HISTORY: Family History  Problem Relation Age of Onset   High  blood pressure Mother    Lung cancer Mother        37   Neuropathy  Mother    Gout Mother    Alzheimer's disease Father        severe dementia   High Cholesterol Father    Gout Father    Other Father        back problems   Alcoholism Brother    Migraines Other        strong maternal fam hx     SOCIAL HISTORY: Social History   Socioeconomic History   Marital status: Married    Spouse name: Not on file   Number of children: 2   Years of education: 14   Highest education level: Associate degree: academic program  Occupational History   Occupation: printing  Tobacco Use   Smoking status: Former    Types: Cigarettes    Quit date: 06/28/2006    Years since quitting: 15.9   Smokeless tobacco: Never  Vaping Use   Vaping Use: Never used  Substance and Sexual Activity   Alcohol use: Yes    Alcohol/week: 2.0 standard drinks of alcohol    Types: 2 Cans of beer per week   Drug use: Not Currently   Sexual activity: Yes    Partners: Female  Other Topics Concern   Not on file  Social History Narrative   Lives at home with his wife. 2 children from prior marriage. 6 grandkids.       Engineer, civil (consulting) at [employer]   Social Determinants of Health   Financial Resource Strain: Not on file  Food Insecurity: Not on file  Transportation Needs: Not on file  Physical Activity: Not on file  Stress: Not on file  Social Connections: Not on file  Intimate Partner Violence: Not on file    PHYSICAL EXAM  There were no vitals filed for this visit.  There is no height or weight on file to calculate BMI.    02/07/2022    8:35 AM  MMSE - Mini Mental State Exam  Orientation to time 5  Orientation to Place 5  Registration 3  Attention/ Calculation 5  Recall 3  Language- name 2 objects 2  Language- repeat 1  Language- follow 3 step command 3  Language- read & follow direction 1  Write a sentence 1  Copy design 1  Total score 30   DIAGNOSTIC DATA (LABS, IMAGING, TESTING) - I reviewed patient records, labs, notes, testing and imaging  myself where available.  No results found for: "WBC", "HGB", "HCT", "MCV", "PLT"    Component Value Date/Time   NA 144 11/21/2017 1555   K 3.9 11/21/2017 1555   CL 107 (H) 11/21/2017 1555   CO2 18 (L) 11/21/2017 1555   GLUCOSE 102 (H) 11/21/2017 1555   BUN 12 11/21/2017 1555   CREATININE 1.11 11/21/2017 1555   CALCIUM 9.6 11/21/2017 1555   PROT 7.3 11/13/2018 1657   GFRNONAA 72 11/21/2017 1555   GFRAA 83 11/21/2017 1555   No results found for: "CHOL", "HDL", "LDLCALC", "LDLDIRECT", "TRIG", "CHOLHDL" Lab Results  Component Value Date   HGBA1C 5.7 (H) 11/13/2018   Lab Results  Component Value Date   VITAMINB12 440 11/13/2018   No results found for: "TSH"   ASSESSMENT AND PLAN 67 y.o. year old male   Chronic migraine headache  -Actually doing better, broke the cycle of rebound headache -For now, continue Topamax 200 mg daily, Ajovy for migraine preventative -Continue Relpax  as needed for severe headaches but cautioned about potential for rebound headaches, encouraged to try Ubrelvy 100 mg as needed, less chance for rebound headache -If migraines increase consider Botox, or switch to [place]  Reported memory change for several years, no change with Topamax reduction or elimination -MMSE 30/30 today -I recommend MRI of the brain, or reversible causes of dementia -Recommend labs for reversible causes of memory changes, have labs done endocrinology wants to see what was already collected, otherwise to consider TSH, B12, RPR, sed rate -Referral for neuropsychological testing -Already on CPAP -Considered switching to Zonegran but had such a major increase in headache with Topamax reduction - ATN teasting and APOE4 if neuropsych testing with anything concerning will go to PET scan amyloid and further testing  Orders Placed This Encounter  Procedures   MR BRAIN W WO CONTRAST   ATN PROFILE   APOE Alzheimer's Risk   CBC with Differential/Platelets   Comprehensive metabolic  panel   Ambulatory referral to Neuropsychology    Naomie Dean, MD [facility] [address] [place], [place] [address] [phone]

## 2022-06-05 NOTE — Patient Instructions (Addendum)
  Reported memory change for several years, no change with Topamax reduction or elimination -MMSE 30/30 today -I recommend MRI of the brain, or reversible causes of dementia -Recommend labs for reversible causes of memory changes, have labs done endocrinology wants to see what was already collected, otherwise to consider TSH, B12, RPR, sed rate -Referral for neuropsychological testing -Already on CPAP -Considered switching to Zonegran but had such a major increase in headache with Topamax reduction - ATN teasting and APOE4 if neuropsych testing with anything concerning will go to PET scan amyloid and further testing  Orders Placed This Encounter  Procedures   MR BRAIN W WO CONTRAST   ATN PROFILE   APOE Alzheimer's Risk   CBC with Differential/Platelets   Comprehensive metabolic panel   Ambulatory referral to Neuropsychology

## 2022-06-06 ENCOUNTER — Telehealth: Payer: Self-pay | Admitting: Neurology

## 2022-06-06 NOTE — Telephone Encounter (Signed)
Referral sent to Dr. Marthenia Rolling: Phone: [phone] Fax: [phone]

## 2022-06-13 ENCOUNTER — Other Ambulatory Visit (INDEPENDENT_AMBULATORY_CARE_PROVIDER_SITE_OTHER): Payer: Self-pay

## 2022-06-13 ENCOUNTER — Ambulatory Visit (INDEPENDENT_AMBULATORY_CARE_PROVIDER_SITE_OTHER): Payer: BC Managed Care – PPO

## 2022-06-13 DIAGNOSIS — R410 Disorientation, unspecified: Secondary | ICD-10-CM

## 2022-06-13 DIAGNOSIS — G309 Alzheimer's disease, unspecified: Secondary | ICD-10-CM

## 2022-06-13 DIAGNOSIS — R4189 Other symptoms and signs involving cognitive functions and awareness: Secondary | ICD-10-CM

## 2022-06-13 DIAGNOSIS — Z0289 Encounter for other administrative examinations: Secondary | ICD-10-CM

## 2022-06-13 DIAGNOSIS — R41844 Frontal lobe and executive function deficit: Secondary | ICD-10-CM

## 2022-06-13 DIAGNOSIS — R413 Other amnesia: Secondary | ICD-10-CM

## 2022-06-13 DIAGNOSIS — F0390 Unspecified dementia without behavioral disturbance: Secondary | ICD-10-CM

## 2022-06-13 MED ORDER — GADOBENATE DIMEGLUMINE 529 MG/ML IV SOLN
20.0000 mL | Freq: Once | INTRAVENOUS | Status: AC | PRN
Start: 2022-06-13 — End: 2022-06-13
  Administered 2022-06-13: 20 mL via INTRAVENOUS

## 2022-06-14 LAB — ATN PROFILE

## 2022-06-14 LAB — COMPREHENSIVE METABOLIC PANEL
AST: 19 IU/L (ref 0–40)
Glucose: 100 mg/dL — ABNORMAL HIGH (ref 70–99)
Total Protein: 7.6 g/dL (ref 6.0–8.5)

## 2022-06-15 LAB — COMPREHENSIVE METABOLIC PANEL
Calcium: 9.3 mg/dL (ref 8.6–10.2)
Creatinine, Ser: 1.34 mg/dL — ABNORMAL HIGH (ref 0.76–1.27)
Sodium: 141 mmol/L (ref 134–144)
eGFR: 59 mL/min/{1.73_m2} — ABNORMAL LOW (ref >59)

## 2022-06-15 LAB — APOE ALZHEIMER'S RISK

## 2022-06-15 LAB — ATN PROFILE

## 2022-06-16 ENCOUNTER — Encounter: Payer: Self-pay | Admitting: Family Medicine

## 2022-06-16 ENCOUNTER — Ambulatory Visit (INDEPENDENT_AMBULATORY_CARE_PROVIDER_SITE_OTHER): Payer: BC Managed Care – PPO | Admitting: Family Medicine

## 2022-06-16 VITALS — BP 128/62 | HR 45 | Temp 97.8°F | Ht 72.0 in | Wt 226.6 lb

## 2022-06-16 DIAGNOSIS — G4733 Obstructive sleep apnea (adult) (pediatric): Secondary | ICD-10-CM | POA: Diagnosis not present

## 2022-06-16 DIAGNOSIS — R008 Other abnormalities of heart beat: Secondary | ICD-10-CM | POA: Diagnosis not present

## 2022-06-16 DIAGNOSIS — Z0001 Encounter for general adult medical examination with abnormal findings: Secondary | ICD-10-CM

## 2022-06-16 DIAGNOSIS — E039 Hypothyroidism, unspecified: Secondary | ICD-10-CM | POA: Diagnosis not present

## 2022-06-16 DIAGNOSIS — R001 Bradycardia, unspecified: Secondary | ICD-10-CM

## 2022-06-16 LAB — COMPREHENSIVE METABOLIC PANEL
ALT: 20 IU/L (ref 0–44)
Albumin/Globulin Ratio: 1.3 (ref 1.2–2.2)
Albumin: 4.3 g/dL (ref 3.9–4.9)
Alkaline Phosphatase: 93 IU/L (ref 44–121)
Bilirubin Total: 0.4 mg/dL (ref 0.0–1.2)
Globulin, Total: 3.3 g/dL (ref 1.5–4.5)

## 2022-06-16 NOTE — Patient Instructions (Addendum)
Call [facility] and get Colonoscopy scheduled when you are ready.  Let us know when you get your Pneumonia and COVID vaccines in the fall.  Schedule labs when you get back from the beach to check thyroid- TSH, t3, t4  We have placed a referral for you today to [facility] cardiology. In some cases you will see # listed below- you can call this if you have not heard within a week. If you do not see # listed- you should receive a mychart message or phone call within a week with the # to call.   I suggest myfitnesspal app Use 0.5 pounds per week weight loss goal Set a reasonable goal such as 5-10 lbs and can reset at later date Do not connect your step counter to this- watch or phone Update me at follow up with how you are doing  Recommended follow up: Return for next already scheduled visit or sooner if needed.

## 2022-06-16 NOTE — Progress Notes (Signed)
Phone: [phone]   Subjective:  Patient presents today for their annual physical. Chief complaint-noted.   See problem oriented charting- ROS- full  review of systems was completed and negative  except for: chronic back pain   The following were reviewed and entered/updated in epic: Past Medical History:  Diagnosis Date   Allergic rhinitis due to dust    Anxiety    BPH (benign prostatic hyperplasia)    Bulging lumbar disc    Concussion    Deviated septum    Dyshidrotic eczema    Gastroesophageal reflux    Hypothyroidism    IBS (irritable bowel syndrome)    Insomnia    Low back pain    Migraines    Nephrolithiasis    Neuropathy    Onychomycosis    OSA (obstructive sleep apnea)    Osteopenia    Peripheral neuropathy    Rheumatic fever    67 y.o.- no long term issues   Seborrheic keratosis    Shingles    Patient Active Problem List   Diagnosis Date Noted   Chronic back pain on long term narcotics 02/17/2022    Priority: High   Drug-induced myopathy 02/17/2022    Priority: Medium    Hypothyroidism 02/17/2022    Priority: Medium    BPH associated with nocturia 02/17/2022    Priority: Medium    OSA on CPAP 02/17/2022    Priority: Medium    Memory change 02/07/2022    Priority: Medium    Chronic migraine without aura without status migrainosus, not intractable 05/12/2020    Priority: Medium    Hyperlipidemia, unspecified 04/20/2009    Priority: Medium    Anxiety state 04/20/2009    Priority: Medium    GERD 03/10/2009    Priority: Medium    Irritable bowel syndrome 03/10/2009    Priority: Medium    Other allergic rhinitis 04/24/2019    Priority: Low   Polyneuropathy 11/13/2018    Priority: Low   Gastroparesis 04/07/2009    Priority: Low   Pain in joint, shoulder region 08/19/2014    Priority: 1.   Left knee pain 04/30/2013    Priority: 1.   Plantar fasciitis 03/06/2013    Priority: 1.   Past Surgical History:  Procedure Laterality Date    arthroscopic knee surgery Left    COLONOSCOPY     REPLACEMENT TOTAL KNEE Left    SEPTOPLASTY     TENNIS ELBOW RELEASE/NIRSCHEL PROCEDURE     VASECTOMY      Family History  Problem Relation Age of Onset   High blood pressure Mother    Lung cancer Mother        25   Neuropathy Mother    Gout Mother    Alzheimer's disease Father        severe dementia   High Cholesterol Father    Gout Father    Other Father        back problems   Alcoholism Brother    Migraines Other        strong maternal fam hx     Medications- reviewed and updated Current Outpatient Medications  Medication Sig Dispense Refill   ALLEGRA-D ALLERGY & CONGESTION 180-240 MG 24 hr tablet Take 1 tablet by mouth daily.   3   aspirin-acetaminophen-caffeine (EXCEDRIN MIGRAINE) 250-250-65 MG tablet Take by mouth every 6 (six) hours as needed for headache.     desoximetasone (TOPICORT) 0.25 % cream Apply 1 application topically daily as needed.  DEXILANT 60 MG capsule Take 60 mg by mouth every other day.     docusate sodium (COLACE) 100 MG capsule Take 100 mg by mouth daily.      eletriptan (RELPAX) 40 MG tablet Take 1 tablet (40 mg total) by mouth every 2 (two) hours as needed for migraine or headache. Maximum twice daily. 42 tablet 3   fluticasone (CUTIVATE) 0.005 % ointment Apply 1 application topically 2 (two) times daily.     fluticasone (FLONASE) 50 MCG/ACT nasal spray Place 2 sprays into both nostrils daily. 48 g 3   ipratropium (ATROVENT) 0.06 % nasal spray 1 spray in each nostril 2 times daily 45 mL 3   NAFTIN 2 % CREA as needed.     omeprazole (PRILOSEC) 40 MG capsule Take 1-2 tablets by mouth at bedtime.      POTASSIUM PO Take 99 mg by mouth.      SYNTHROID 112 MCG tablet Take 112 mcg by mouth daily before breakfast.     tamsulosin (FLOMAX) 0.4 MG CAPS capsule 1 capsule 30 minutes after the same meal each day     topiramate (TOPAMAX) 200 MG tablet Take 1 tablet (200 mg total) by mouth daily. 90 tablet 4    traZODone (DESYREL) 50 MG tablet TAKE 0.5-1 TABLETS BY MOUTH AT BEDTIME AS NEEDED FOR SLEEP. 90 tablet 2   triamcinolone ointment (KENALOG) 0.1 % as needed.     WELCHOL 625 MG tablet Take 3 tablets by mouth Twice daily.     HYDROcodone-acetaminophen (NORCO) 10-325 MG tablet Take 1 tablet by mouth 2 (two) times daily as needed (do not drive for 8 hours after taking). (Patient not taking: Reported on 06/16/2022) 60 tablet 0   ibuprofen (ADVIL) 800 MG tablet Take 1 tablet (800 mg total) by mouth 2 (two) times daily as needed. (Patient not taking: Reported on 06/16/2022) 180 tablet 3   No current facility-administered medications for this visit.    Allergies-reviewed and updated Allergies  Allergen Reactions   Gabapentin    Oxycodone Itching, Nausea And Vomiting and Nausea Only   Penicillins     Reaction occurred as an infant; parents told him never to take PCN   Terbinafine And Related Rash    All over body    Social History   Social History Narrative   Lives at home with his wife. 2 children from prior marriage. 6 grandkids.       Engineer, civil (consulting) at [employer]   Objective  Objective:  BP 128/62   Pulse (!) 45   Temp 97.8 F (36.6 C)   Ht 6' (1.829 m)   Wt 226 lb 9.6 oz (102.8 kg)   SpO2 99%   BMI 30.73 kg/m  Gen: NAD, resting comfortably HEENT: Mucous membranes are moist. Oropharynx normal Neck: no thyromegaly CV: regularly irregular- 2 beats then a pause- no murmurs rubs or gallops Lungs: CTAB no crackles, wheeze, rhonchi Abdomen: soft/nontender/nondistended/normal bowel sounds. No rebound or guarding.  Ext: trace edema left worse than right Skin: warm, dry Neuro: grossly normal, moves all extremities, PERRLA  EKG: sinus rhythm with rate 72 but frequent PVC's-2 sinus beats followed by 1 PVC consistent with trigeminy , normal axis, normal intervals, no hypertrophy, no st or t wave changes    Assessment and Plan  67 y.o. male presenting for annual physical.   Health Maintenance counseling: 1. Anticipatory guidance: Patient counseled regarding regular dental exams -q6 months, eye exams -yearly,  avoiding smoking and second hand smoke- ,  limiting alcohol to 2 beverages per day - 0 most weeks, no illicit drugs .   2. Risk factor reduction:  Advised patient of need for regular exercise and diet rich and fruits and vegetables to reduce risk of heart attack and stroke.  Exercise- still playing golf 2-4 days a week and tries to bowl- but has hip issues which is a barrier- plans to address in fall.  Diet/weight management-would like to work towards 200 lbs- peak up to 250 in past- success in the past with reducing breads/sugars/cheeses/starches. Feels snacking after dinner is barrier and sometimes not best dinner- does well better in day. Discussed some options to help him work things down.  Wt Readings from Last 3 Encounters:  06/16/22 226 lb 9.6 oz (102.8 kg)  05/01/22 223 lb 6.4 oz (101.3 kg)  02/17/22 226 lb 6.4 oz (102.7 kg)  3. Immunizations/screenings/ancillary studies- declines HIV and HCV screen- up to date - recommended annual flu/covid Immunization History  Administered Date(s) Administered   Influenza-Unspecified 11/13/2021   PFIZER Comirnaty(Gray Top)Covid-19 Tri-Sucrose Vaccine 10/27/2021   PFIZER(Purple Top)SARS-COV-2 Vaccination 04/10/2019, 05/07/2019, 11/06/2019, 06/16/2020, 12/11/2020   Tdap 05/29/2005, 05/01/2022   Zoster Recombinat (Shingrix) 12/12/2017, 03/01/2018  4. Prostate cancer screening-  just had PSA check with Dr. Imelda Pillow office Dr. Cardell Peach so does not need repeat. Also had UA checked there  5. Colon cancer screening - 03/24/16 with 5 year repeat so slightly overdue due to polyp history- we  will try to get records 6. Skin cancer screening- yearly derm visit. advised regular sunscreen use. Denies worrisome, changing, or new skin lesions.  7. Smoking associated screening (lung cancer screening, AAA screen 65-75, UA)- former  smoker- quit 2008 doesn't qualify for lung cancer screening. Gets urinalysis with urological team 8. STD screening - only active with wife  Status of chronic or acute concerns   # Trigeminy S: Patient noted to have irregularly irregular heart rate with 2 beats followed by brief pause and then 2 beats.  Patient fully asymptomatic-no chest pain, shortness of breath, headache, blurry vision, dizziness A/P: Incidental trigeminy finding-only medication change has been trazodone-I do not think that is most likely the source of trigeminy but would like cardiology's opinion and consideration of whether he needs an echocardiogram-referral was placed today - Also needs updated thyroid levels in light of this and he agrees to come back for these (our lab is closed at the end of our visit) -Take patient precautions for seeking emergent care but no current indication for that   #Chronic back pain S: medication: hydrocodone 10-325 per primary care doctor Dr. [name]- 60 tablets lasts 90 days - usues prn with activity - hours before bed - Dr. Margaretha Sheffield manages epidural injections about 7-8 months -also with greater trochanteric bursitis issues -has had left knee replacement- has done well  MRI lumbar spine 01/10/2020- mild multilvel spondylosis. No significant spinal canal narrowing, mild multilevel neural foraminal narrowing. Multiple small disc bulges.  -has never seen pain management A/P: overall stable- continue current medications - does not need refill yet -taking ibuprofen most nights- warned of risks to kidney- we will need to check at least every 6 months and if worsens will not be good long term solution  #hyperlipidemia #IBS S: Medication:WelChol 625 mg 3 tablets twice daily -didn't tolerate statins due to myalgia -ended up on welchol and had essential resolution of IBS No results found for: "CHOL", "HDL", "LDLCALC", "LDLDIRECT", "TRIG", "CHOLHDL" A/P: lipids I would not be surprised if mildly  elevated but  recently had labs CBC, CMP and wants to hold off on lipid panel and consider recheck next labs/visit IBS stable- continue current medicines    #hypothyroidism- with Dr. Sharl Ma due to oscillatoins S: compliant On thyroid medication-Synthroid 224 mcg -welchol not ideal for thyroid per Dr. Sharl Ma A/P:TSH monitored with endo- declines need for check today   # BPH S: Medication: Tamsulosin 0.4 mg  A/P: stable- continue current medicines     # Migraines-follows with neurology S: Medication: Relpax as needed- maybe every 2 weeks but may have 2 in a week,  Topamax 200 mg -ajovy not covered A/P: reasonable control- continue current medications      # Anxiety/insomnia S:Medication: Trial trazodone April 2024 -In the past alprazolam,  Ambien 12.5 mg extended release -antidepressants/SSRI in past- severe libido decrease, range of emotion restriction Counseling:  in the past A/P: doing much better lately- continue current medications    # GERD S:Medication: Dexilant 60 mg with gastroenterology  A/P: stable- continue current medicines     #Allergies-on Flonase   #neuropathy- chronic cold feet sensation  -blood flow test ok  #memory loss- better with improved sleep but ongoing workup with neurology   #Just had CBC and CMP- CBC was normal with Dr. Lucia Gaskins and CMP with sugar at 100 but nont fasting. Cr at 1.34 which would be very mild age related kidney changes if persistent  Recommended follow up: Return for next already scheduled visit or sooner if needed. Future Appointments  Date Time Provider Department Center  08/31/2022  3:20 PM Shelva Majestic, MD [facility]    Lab/Order associations:no labs today   ICD-10-CM   1. Preventative health care  Z00.00       No orders of the defined types were placed in this encounter.   Return precautions advised.  Tana Conch, MD

## 2022-06-21 ENCOUNTER — Other Ambulatory Visit: Payer: Self-pay | Admitting: Allergy and Immunology

## 2022-06-27 ENCOUNTER — Ambulatory Visit: Payer: BC Managed Care – PPO | Admitting: Neurology

## 2022-06-29 ENCOUNTER — Telehealth: Payer: BC Managed Care – PPO | Admitting: Neurology

## 2022-06-30 ENCOUNTER — Other Ambulatory Visit: Payer: Self-pay

## 2022-06-30 ENCOUNTER — Other Ambulatory Visit: Payer: BC Managed Care – PPO

## 2022-06-30 DIAGNOSIS — E039 Hypothyroidism, unspecified: Secondary | ICD-10-CM | POA: Diagnosis not present

## 2022-07-01 LAB — T3, FREE: T3, Free: 3 pg/mL (ref 2.3–4.2)

## 2022-07-01 LAB — T4, FREE: Free T4: 1.3 ng/dL (ref 0.8–1.8)

## 2022-07-01 LAB — TSH: TSH: 0.94 mIU/L (ref 0.40–4.50)

## 2022-07-06 LAB — CBC WITH DIFFERENTIAL/PLATELET
Basophils Absolute: 0 10*3/uL (ref 0.0–0.2)
Basos: 1 %
EOS (ABSOLUTE): 0.2 10*3/uL (ref 0.0–0.4)
Eos: 3 %
Hematocrit: 42.4 % (ref 37.5–51.0)
Hemoglobin: 14.1 g/dL (ref 13.0–17.7)
Immature Grans (Abs): 0 10*3/uL (ref 0.0–0.1)
Immature Granulocytes: 0 %
Lymphocytes Absolute: 2 10*3/uL (ref 0.7–3.1)
Lymphs: 33 %
MCH: 30.6 pg (ref 26.6–33.0)
MCHC: 33.3 g/dL (ref 31.5–35.7)
MCV: 92 fL (ref 79–97)
Monocytes Absolute: 0.6 10*3/uL (ref 0.1–0.9)
Monocytes: 9 %
Neutrophils Absolute: 3.3 10*3/uL (ref 1.4–7.0)
Neutrophils: 54 %
Platelets: 219 10*3/uL (ref 150–450)
RBC: 4.61 x10E6/uL (ref 4.14–5.80)
RDW: 12.8 % (ref 11.6–15.4)
WBC: 6.2 10*3/uL (ref 3.4–10.8)

## 2022-07-06 LAB — COMPREHENSIVE METABOLIC PANEL
BUN/Creatinine Ratio: 12 (ref 10–24)
BUN: 16 mg/dL (ref 8–27)
CO2: 22 mmol/L (ref 20–29)
Chloride: 106 mmol/L (ref 96–106)
Potassium: 3.9 mmol/L (ref 3.5–5.2)

## 2022-07-06 LAB — ATN PROFILE: Beta-amyloid 42: 21.06 pg/mL

## 2022-07-06 LAB — APOE ALZHEIMER'S RISK

## 2022-07-15 DIAGNOSIS — G4733 Obstructive sleep apnea (adult) (pediatric): Secondary | ICD-10-CM | POA: Diagnosis not present

## 2022-07-17 DIAGNOSIS — G4733 Obstructive sleep apnea (adult) (pediatric): Secondary | ICD-10-CM | POA: Diagnosis not present

## 2022-07-28 ENCOUNTER — Ambulatory Visit (INDEPENDENT_AMBULATORY_CARE_PROVIDER_SITE_OTHER): Payer: BC Managed Care – PPO | Admitting: Family Medicine

## 2022-07-28 ENCOUNTER — Encounter: Payer: Self-pay | Admitting: Family Medicine

## 2022-07-28 VITALS — BP 130/78 | HR 87 | Temp 98.3°F | Ht 72.0 in | Wt 224.6 lb

## 2022-07-28 DIAGNOSIS — J189 Pneumonia, unspecified organism: Secondary | ICD-10-CM

## 2022-07-28 DIAGNOSIS — R5383 Other fatigue: Secondary | ICD-10-CM | POA: Diagnosis not present

## 2022-07-28 DIAGNOSIS — R051 Acute cough: Secondary | ICD-10-CM

## 2022-07-28 DIAGNOSIS — R519 Headache, unspecified: Secondary | ICD-10-CM

## 2022-07-28 LAB — POC COVID19 BINAXNOW: SARS Coronavirus 2 Ag: NEGATIVE

## 2022-07-28 LAB — POCT INFLUENZA A/B
Influenza A, POC: NEGATIVE
Influenza B, POC: NEGATIVE

## 2022-07-28 MED ORDER — DOXYCYCLINE HYCLATE 100 MG PO TABS: 100.0000 mg | ORAL_TABLET | Freq: Two times a day (BID) | ORAL | 0 refills | Status: AC

## 2022-07-28 NOTE — Progress Notes (Signed)
Cardiology Office Note:   Date:  08/08/2022  NAME:  Joseph Fuentes    MRN: [id number] DOB:  Jun 25, 1955   PCP:  Shelva Majestic, MD  Cardiologist:  None  Electrophysiologist:  None   Referring MD: Shelva Majestic, MD   Chief Complaint  Patient presents with   pvc    History of Present Illness:   Joseph Fuentes is a 67 y.o. male with a hx of hypothyroidism who is being seen today for the evaluation of irregular heartbeat at the request of Shelva Majestic, MD. seen by primary care physician on 06/16/2022.  EKG showed PVCs.  EKG shows no PVCs today.  He reports no palpitations.  Denies chest pains or trouble breathing.  This seems to be an isolated episode.  He does have a history of former tobacco abuse.  He smoked 2 packs a day for 30 years but quit several years ago.  Reports no chest pains or trouble breathing.  Playing golf.  He does report some issues with memory.  Apparently he was on Ativan and other medications and these have stopped.  He reports he is feeling better.  Workup with neurology seems unremarkable.  He is not diabetic.  LDL cholesterol 74.  Statin intolerant.  Denies a strong family history of heart disease.  Recent thyroid studies are normal.  All of his labs are within limits.  CV exam normal.  EKG normal.  He does mention a history of calcifications in his aorta and iliac arteries.  These were seen on CT scans in 2019.  ABIs at that time were negative.  Pulses on today's exam are normal.  Reports no claudication symptoms.  Denies any symptoms from PVCs.  We discussed the monitor and echo just to make sure he is not having PVCs he is unaware of or any detriment to his heart function.  We also discussed coronary calcium screening given calcification seen in the aorta iliac system.  Problem List PAD -aortic and iliac artery calcifications  -negative ABI 2019 HLD -T chol 196, HDL 50, LDL 128, TG 102 -statin intolerant 3. Former tobacco abuse     Past Medical  History: Past Medical History:  Diagnosis Date   Allergic rhinitis due to dust    Anxiety    BPH (benign prostatic hyperplasia)    Bulging lumbar disc    Concussion    Deviated septum    Dyshidrotic eczema    Gastroesophageal reflux    Hypothyroidism    IBS (irritable bowel syndrome)    Insomnia    Low back pain    Migraines    Nephrolithiasis    Neuropathy    Onychomycosis    OSA (obstructive sleep apnea)    Osteopenia    Peripheral neuropathy    Rheumatic fever    67 y.o.- no long term issues   Seborrheic keratosis    Shingles     Past Surgical History: Past Surgical History:  Procedure Laterality Date   arthroscopic knee surgery Left    COLONOSCOPY     REPLACEMENT TOTAL KNEE Left    SEPTOPLASTY     TENNIS ELBOW RELEASE/NIRSCHEL PROCEDURE     VASECTOMY      Current Medications: Current Meds  Medication Sig   ALLEGRA-D ALLERGY & CONGESTION 180-240 MG 24 hr tablet Take 1 tablet by mouth daily.    aspirin-acetaminophen-caffeine (EXCEDRIN MIGRAINE) 250-250-65 MG tablet Take by mouth every 6 (six) hours as needed for headache.   desoximetasone (TOPICORT) 0.25 %  cream Apply 1 application topically daily as needed.   DEXILANT 60 MG capsule Take 60 mg by mouth every other day.   docusate sodium (COLACE) 100 MG capsule Take 100 mg by mouth daily.    eletriptan (RELPAX) 40 MG tablet Take 1 tablet (40 mg total) by mouth every 2 (two) hours as needed for migraine or headache. Maximum twice daily.   fluticasone (CUTIVATE) 0.005 % ointment Apply 1 application topically 2 (two) times daily.   fluticasone (FLONASE) 50 MCG/ACT nasal spray Place 2 sprays into both nostrils daily.   HYDROcodone-acetaminophen (NORCO) 10-325 MG tablet Take 1 tablet by mouth 2 (two) times daily as needed (do not drive for 8 hours after taking).   ibuprofen (ADVIL) 800 MG tablet Take 1 tablet (800 mg total) by mouth 2 (two) times daily as needed.   ipratropium (ATROVENT) 0.06 % nasal spray 1 spray in  each nostril 2 times daily   NAFTIN 2 % CREA as needed.   omeprazole (PRILOSEC) 40 MG capsule Take 1-2 tablets by mouth at bedtime.    POTASSIUM PO Take 99 mg by mouth.    SYNTHROID 112 MCG tablet Take 112 mcg by mouth daily before breakfast.   tamsulosin (FLOMAX) 0.4 MG CAPS capsule 1 capsule 30 minutes after the same meal each day   topiramate (TOPAMAX) 200 MG tablet Take 1 tablet (200 mg total) by mouth daily.   traZODone (DESYREL) 50 MG tablet TAKE 0.5-1 TABLETS BY MOUTH AT BEDTIME AS NEEDED FOR SLEEP.   triamcinolone ointment (KENALOG) 0.1 % as needed.   WELCHOL 625 MG tablet Take 3 tablets by mouth Twice daily.     Allergies:    Gabapentin, Oxycodone, Penicillins, and Terbinafine and related   Social History: Social History   Socioeconomic History   Marital status: Married    Spouse name: Not on file   Number of children: 2   Years of education: 14   Highest education level: Associate degree: academic program  Occupational History   Occupation: Editor, commissioning  Tobacco Use   Smoking status: Former    Packs/day: 2.00    Years: 30.00    Additional pack years: 0.00    Total pack years: 60.00    Types: Cigarettes    Quit date: 06/28/2006    Years since quitting: 16.1   Smokeless tobacco: Never  Vaping Use   Vaping Use: Never used  Substance and Sexual Activity   Alcohol use: Yes    Alcohol/week: 2.0 standard drinks of alcohol    Types: 2 Cans of beer per week   Drug use: Not Currently   Sexual activity: Yes    Partners: Female  Other Topics Concern   Not on file  Social History Narrative   Lives at home with his wife. 2 children from prior marriage. 6 grandkids.       Engineer, civil (consulting) at [employer]   Social Determinants of Health   Financial Resource Strain: Not on file  Food Insecurity: Not on file  Transportation Needs: Not on file  Physical Activity: Not on file  Stress: Not on file  Social Connections: Not on file     Family History: The patient's  family history includes Alcoholism in his brother; Alzheimer's disease in his father; Gout in his father and mother; High Cholesterol in his father; High blood pressure in his mother; Lung cancer in his mother; Migraines in an other family member; Neuropathy in his mother; Other in his father.  ROS:   All other  ROS reviewed and negative. Pertinent positives noted in the HPI.     EKGs/Labs/Other Studies Reviewed:   The following studies were personally reviewed by me today:  EKG:  EKG is ordered today.    EKG Interpretation Date/Time:  Tuesday August 08 2022 15:28:31 EDT Ventricular Rate:  72 PR Interval:  186 QRS Duration:  94 QT Interval:  380 QTC Calculation: 416 R Axis:   57  Text Interpretation: Normal sinus rhythm Normal ECG Confirmed by Lennie Odor ([id number]) on 08/08/2022 3:38:36 PM   Recent Labs: 06/13/2022: ALT 20; BUN 16; Creatinine, Ser 1.34; Hemoglobin 14.1; Platelets 219; Potassium 3.9; Sodium 141 06/30/2022: TSH 0.94   Recent Lipid Panel No results found for: "CHOL", "TRIG", "HDL", "CHOLHDL", "VLDL", "LDLCALC", "LDLDIRECT"  Physical Exam:   VS:  BP 114/72 (BP Location: Right Arm, Patient Position: Sitting, Cuff Size: Normal)   Pulse 74   Ht 6' (1.829 m)   Wt 224 lb (101.6 kg)   SpO2 94%   BMI 30.38 kg/m    Wt Readings from Last 3 Encounters:  08/08/22 224 lb (101.6 kg)  07/28/22 224 lb 9.6 oz (101.9 kg)  06/16/22 226 lb 9.6 oz (102.8 kg)    General: Well nourished, well developed, in no acute distress Head: Atraumatic, normal size  Eyes: PEERLA, EOMI  Neck: Supple, no JVD Endocrine: No thryomegaly Cardiac: Normal S1, S2; RRR; no murmurs, rubs, or gallops Lungs: Clear to auscultation bilaterally, no wheezing, rhonchi or rales  Abd: Soft, nontender, no hepatomegaly  Ext: No edema, pulses 2+ Musculoskeletal: No deformities, BUE and BLE strength normal and equal Skin: Warm and dry, no rashes   Neuro: Alert and oriented to person, place, time, and situation,  CNII-XII grossly intact, no focal deficits  Psych: Normal mood and affect   ASSESSMENT:   Joseph Fuentes is a 67 y.o. male who presents for the following: 1. PVC (premature ventricular contraction)   2. Irregular heart beat     PLAN:   1. PVC (premature ventricular contraction) 2. Irregular heart beat -PVCs seen on recent EKG.  EKG today shows no evidence of this.  All of his labs are within limits.  EKG is normal today.  CV exam normal.  No symptoms of angina.  TSH within limits.  We discussed a Zio patch for 3 days to exclude high PVC burden.  I would also like for him to get an echo to make sure his heart is structurally normal.  We discussed indications for treatment which include high PVC burden, symptoms and/or reduction in heart function.  We will make sure this is not an issue.  Given his smoking history and calcifications seen in the aortic iliac system I have recommended a calcium screening.  He is statin intolerant.  His most recent LDL cholesterol is 128.  He is on WelChol.  May need to consider PCSK9 inhibitor therapy.  Will base this on his calcium score test.  His biggest risk factor for cardiovascular disease is former smoking.  Overall seems to be stable but statin intolerant.  We will plan to see him back as needed based on results of the tests.      Disposition: Return if symptoms worsen or fail to improve.  Medication Adjustments/Labs and Tests Ordered: Current medicines are reviewed at length with the patient today.  Concerns regarding medicines are outlined above.  Orders Placed This Encounter  Procedures   CT CARDIAC SCORING (SELF PAY ONLY)   LONG TERM MONITOR (3-14 DAYS)   EKG  12-Lead   ECHOCARDIOGRAM COMPLETE   No orders of the defined types were placed in this encounter.  Patient Instructions     Testing/Procedures:  Your physician has requested that you have an echocardiogram. Echocardiography is a painless test that uses sound waves to create images of  your heart. It provides your doctor with information about the size and shape of your heart and how well your heart's chambers and valves are working. This procedure takes approximately one hour. There are no restrictions for this procedure. Please do NOT wear cologne, perfume, aftershave, or lotions (deodorant is allowed). Please arrive 15 minutes prior to your appointment time. [facility]  CORONARY CALCIUM SCORING CT SCAN AT THE [facility]   ZIO XT- Long Term Monitor Instructions  Your physician has requested you wear a ZIO patch monitor for 3 days.  This is a single patch monitor. [employer] supplies one patch monitor per enrollment. Additional stickers are not available. Please do not apply patch if you will be having a Nuclear Stress Test,  Echocardiogram, Cardiac CT, MRI, or Chest Xray during the period you would be wearing the  monitor. The patch cannot be worn during these tests. You cannot remove and re-apply the  ZIO XT patch monitor.  Your ZIO patch monitor will be mailed 3 day USPS to your address on file. It may take 3-5 days  to receive your monitor after you have been enrolled.  Once you have received your monitor, please review the enclosed instructions. Your monitor  has already been registered assigning a specific monitor serial # to you.  Billing and Patient Assistance Program Information  We have supplied [employer] with any of your insurance information on file for billing purposes. [employer] offers a sliding scale Patient Assistance Program for patients that do not have  insurance, or whose insurance does not completely cover the cost of the ZIO monitor.  You must apply for the Patient Assistance Program to qualify for this discounted rate.  To apply, please call [employer] at [phone], select option 4, select option 2, ask to apply for  Patient Assistance Program. Meredeth Ide will ask your household income, and how many people  are in your household. They will  quote your out-of-pocket cost based on that information.  [employer] will also be able to set up a 89-month, interest-free payment plan if needed.  Applying the monitor   Shave hair from upper left chest.  Hold abrader disc by orange tab. Rub abrader in 40 strokes over the upper left chest as  indicated in your monitor instructions.  Clean area with 4 enclosed alcohol pads. Let dry.  Apply patch as indicated in monitor instructions. Patch will be placed under collarbone on left  side of chest with arrow pointing upward.  Rub patch adhesive wings for 2 minutes. Remove white label marked "1". Remove the white  label marked "2". Rub patch adhesive wings for 2 additional minutes.  While looking in a mirror, press and release button in center of patch. A small green light will  flash 3-4 times. This will be your only indicator that the monitor has been turned on.  Do not shower for the first 24 hours. You may shower after the first 24 hours.  Press the button if you feel a symptom. You will hear a small click. Record Date, Time and  Symptom in the Patient Logbook.  When you are ready to remove the patch, follow instructions on the last 2 pages of Patient  Logbook. Stick  patch monitor onto the last page of Patient Logbook.  Place Patient Logbook in the blue and white box. Use locking tab on box and tape box closed  securely. The blue and white box has prepaid postage on it. Please place it in the mailbox as  soon as possible. Your physician should have your test results approximately 7 days after the  monitor has been mailed back to [facility].  Call [facility] Customer Care at [phone] if you have questions regarding  your ZIO XT patch monitor. Call them immediately if you see an orange light blinking on your  monitor.  If your monitor falls off in less than 4 days, contact our Monitor department at [phone].  If your monitor becomes loose or falls off after 4 days call [employer]  at [phone] for  suggestions on securing your monitor    Follow-Up: At [facility], you and your health needs are our priority.  As part of our continuing mission to provide you with exceptional heart care, we have created designated Provider Care Teams.  These Care Teams include your primary Cardiologist (physician) and Advanced Practice Providers (APPs -  Physician Assistants and Nurse Practitioners) who all work together to provide you with the care you need, when you need it.  We recommend signing up for the patient portal called "MyChart".  Sign up information is provided on this After Visit Summary.  MyChart is used to connect with patients for Virtual Visits (Telemedicine).  Patients are able to view lab/test results, encounter notes, upcoming appointments, etc.  Non-urgent messages can be sent to your provider as well.   To learn more about what you can do with MyChart, go to [email or web address].    Your next appointment:   AS NEEDED   Signed, Gerri Spore T. Flora Lipps, MD, [facility]  [facility]  [address], [address] [place], [place] [address] [phone]  08/08/2022 4:58 PM

## 2022-07-28 NOTE — Progress Notes (Addendum)
Phone [phone] In person visit   Subjective:   Kaliel Porcella is a 67 y.o. year old very pleasant male patient who presents for/with See problem oriented charting Chief Complaint  Patient presents with   Cough   Fatigue   Migraine    Past Medical History-  Patient Active Problem List   Diagnosis Date Noted   Chronic back pain on long term narcotics 02/17/2022    Priority: High   Drug-induced myopathy 02/17/2022    Priority: Medium    Hypothyroidism 02/17/2022    Priority: Medium    BPH associated with nocturia 02/17/2022    Priority: Medium    OSA on CPAP 02/17/2022    Priority: Medium    Memory change 02/07/2022    Priority: Medium    Chronic migraine without aura without status migrainosus, not intractable 05/12/2020    Priority: Medium    Hyperlipidemia, unspecified 04/20/2009    Priority: Medium    Anxiety state 04/20/2009    Priority: Medium    GERD 03/10/2009    Priority: Medium    Irritable bowel syndrome 03/10/2009    Priority: Medium    Other allergic rhinitis 04/24/2019    Priority: Low   Polyneuropathy 11/13/2018    Priority: Low   Gastroparesis 04/07/2009    Priority: Low   Pain in joint, shoulder region 08/19/2014    Priority: 1.   Left knee pain 04/30/2013    Priority: 1.   Plantar fasciitis 03/06/2013    Priority: 1.    Medications- reviewed and updated Current Outpatient Medications  Medication Sig Dispense Refill   ALLEGRA-D ALLERGY & CONGESTION 180-240 MG 24 hr tablet Take 1 tablet by mouth daily.   3   aspirin-acetaminophen-caffeine (EXCEDRIN MIGRAINE) 250-250-65 MG tablet Take by mouth every 6 (six) hours as needed for headache.     desoximetasone (TOPICORT) 0.25 % cream Apply 1 application topically daily as needed.     DEXILANT 60 MG capsule Take 60 mg by mouth every other day.     docusate sodium (COLACE) 100 MG capsule Take 100 mg by mouth daily.      doxycycline (VIBRA-TABS) 100 MG tablet Take 1 tablet (100 mg total) by mouth 2  (two) times daily for 7 days. 14 tablet 0   eletriptan (RELPAX) 40 MG tablet Take 1 tablet (40 mg total) by mouth every 2 (two) hours as needed for migraine or headache. Maximum twice daily. 42 tablet 3   fluticasone (CUTIVATE) 0.005 % ointment Apply 1 application topically 2 (two) times daily.     fluticasone (FLONASE) 50 MCG/ACT nasal spray Place 2 sprays into both nostrils daily. 48 g 3   HYDROcodone-acetaminophen (NORCO) 10-325 MG tablet Take 1 tablet by mouth 2 (two) times daily as needed (do not drive for 8 hours after taking). 60 tablet 0   ibuprofen (ADVIL) 800 MG tablet Take 1 tablet (800 mg total) by mouth 2 (two) times daily as needed. 180 tablet 3   ipratropium (ATROVENT) 0.06 % nasal spray 1 spray in each nostril 2 times daily 45 mL 3   NAFTIN 2 % CREA as needed.     omeprazole (PRILOSEC) 40 MG capsule Take 1-2 tablets by mouth at bedtime.      POTASSIUM PO Take 99 mg by mouth.      SYNTHROID 112 MCG tablet Take 112 mcg by mouth daily before breakfast.     tamsulosin (FLOMAX) 0.4 MG CAPS capsule 1 capsule 30 minutes after the same meal each day  topiramate (TOPAMAX) 200 MG tablet Take 1 tablet (200 mg total) by mouth daily. 90 tablet 4   traZODone (DESYREL) 50 MG tablet TAKE 0.5-1 TABLETS BY MOUTH AT BEDTIME AS NEEDED FOR SLEEP. 90 tablet 2   triamcinolone ointment (KENALOG) 0.1 % as needed.     WELCHOL 625 MG tablet Take 3 tablets by mouth Twice daily.     No current facility-administered medications for this visit.     Objective:  BP 130/78   Pulse 87   Temp 98.3 F (36.8 C)   Ht 6' (1.829 m)   Wt 224 lb 9.6 oz (101.9 kg)   SpO2 97%   BMI 30.46 kg/m  Gen: NAD, resting comfortably Left maxillary sinus tenderness noted left nasal turbinate edematous and erythematous with yellow discharge.  Tympanic membrane normal bilaterally, oropharynx with some signs of drainage CV: RRR no murmurs rubs or gallops Lungs: Slightly decreased breath sounds at left lung base along with  faint crackles-rest of lung exam clear to auscultation Abdomen: soft/nontender/nondistended/normal bowel sounds. No rebound or guarding.  Ext: no edema Skin: warm, dry  Results for orders placed or performed in visit on 07/28/22 (from the past 24 hour(s))  POC COVID-19     Status: Normal   Collection Time: 07/28/22  2:53 PM  Result Value Ref Range   SARS Coronavirus 2 Ag Negative Negative  POCT Influenza A/B     Status: Normal   Collection Time: 07/28/22  2:53 PM  Result Value Ref Range   Influenza A, POC Negative Negative   Influenza B, POC Negative Negative       Assessment and Plan   # Cough/congestion S:starting Tuesday before last had vomiting and diarrhea for up to a few days. Lingering nausea and fatigue and felt like he was better then about 5 days ago started with cough/congestion. Worsening of his migraines. Notes cold/achy. Heavy runny nose with nasal congestion. Coughs up green mucus- has not seen like that since quit smoking years ago. No fever. Has felt more fatigue. Mild shortness of breath due to congestion. No significant ear pressure -no recent tick bites or even known exposure  - no sick contacts.  -tessalong not very helpful in the past A/P:Faint crackle and decreased breath sounds in left lower lung- with you feeling so poorly we opted to do doxycycline antibiotic which will cover most pneumonias but if you are not improving within a week or symptoms worsen please seek care- with me out of office next week would need a physical visit with one of my colleagues if not improving or worsens. Also left maxillary sinus tender and some yellow discharge- this should also cover for sinusitis.  Helping migraines will improve with treatment of underlying illness.  Also wonder about developing bacterial sinusitis with worsening symptoms at day 5 - We alternatively consider doing a chest x-ray today to evaluate for pneumonia but with delayed results of up to 5 days and the being  out of the office next week on Thursday and Friday we thought this could delay care  Prior hydrocodone cough medicine included- Chlorphenamine- you could try that at night by itself to see if it helps you rest.  With baseline hydrocodone prescription did not recommend hydrocodone prescription -COVID, flu testing both negative today  Recommended follow up: Return for as needed for new, worsening, persistent symptoms. Future Appointments  Date Time Provider Department Center  08/08/2022  3:20 PM O'Neal, Ronnald Ramp, MD [facility] None  08/31/2022  3:20 PM Tana Conch  O, MD [facility]    Lab/Order associations:   ICD-10-CM   1. Community acquired pneumonia of left lower lobe of lung  J18.9     2. Acute cough  R05.1 POC COVID-19    POCT Influenza A/B    CANCELED: POC COVID-19    3. Other fatigue  R53.83 POC COVID-19    POCT Influenza A/B    CANCELED: POC COVID-19    4. Nonintractable headache, unspecified chronicity pattern, unspecified headache type  R51.9 POC COVID-19    POCT Influenza A/B    CANCELED: POC COVID-19      Meds ordered this encounter  Medications   doxycycline (VIBRA-TABS) 100 MG tablet    Sig: Take 1 tablet (100 mg total) by mouth 2 (two) times daily for 7 days.    Dispense:  14 tablet    Refill:  0    Time Spent: 34 minutes of total time (2:55 PM- 3:29 PM) was spent on the date of the encounter performing the following actions: chart review prior to seeing the patient as well as well seen patient to review prior cough treatments, obtaining history, performing a medically necessary exam, counseling on the treatment plan for possible pneumonia as well as discussing potential that this could be bronchitis and may not respond to antibiotics, placing orders, and documenting in our EHR.    Return precautions advised.  Tana Conch, MD

## 2022-07-28 NOTE — Patient Instructions (Addendum)
Faint crackle and decreased breath sounds in left lower lung- with you feeling so poorly we opted to do doxycycline antibiotic which will cover most pneumonias but if you are not improving within a week or symptoms worsen please seek care- with me out of office next week would need a physical visit with one of my colleagues if not improving or worsens. Also left maxillary sinus tender and some yellow discharge- this should also cover for sinusitis  Prior hydrocodone cough medicine included- Chlorphenamine- you could try that at night by itself to see if it helps you rest  Recommended follow up: Return for as needed for new, worsening, persistent symptoms.

## 2022-08-08 ENCOUNTER — Ambulatory Visit: Payer: BC Managed Care – PPO | Attending: Cardiovascular Disease | Admitting: Cardiovascular Disease

## 2022-08-08 ENCOUNTER — Ambulatory Visit: Payer: BC Managed Care – PPO | Attending: Cardiovascular Disease

## 2022-08-08 ENCOUNTER — Encounter: Payer: Self-pay | Admitting: Cardiovascular Disease

## 2022-08-08 VITALS — BP 114/72 | HR 74 | Ht 72.0 in | Wt 224.0 lb

## 2022-08-08 DIAGNOSIS — I493 Ventricular premature depolarization: Secondary | ICD-10-CM

## 2022-08-08 DIAGNOSIS — I499 Cardiac arrhythmia, unspecified: Secondary | ICD-10-CM

## 2022-08-08 NOTE — Progress Notes (Unsigned)
Enrolled patient for a 3 day Zio XT monitor to be mailed to patients home  

## 2022-08-08 NOTE — Patient Instructions (Signed)
Testing/Procedures:  Your physician has requested that you have an echocardiogram. Echocardiography is a painless test that uses sound waves to create images of your heart. It provides your doctor with information about the size and shape of your heart and how well your heart's chambers and valves are working. This procedure takes approximately one hour. There are no restrictions for this procedure. Please do NOT wear cologne, perfume, aftershave, or lotions (deodorant is allowed). Please arrive 15 minutes prior to your appointment time. [facility]  CORONARY CALCIUM SCORING CT SCAN AT THE [facility]   ZIO XT- Long Term Monitor Instructions  Your physician has requested you wear a ZIO patch monitor for 3 days.  This is a single patch monitor. [employer] supplies one patch monitor per enrollment. Additional stickers are not available. Please do not apply patch if you will be having a Nuclear Stress Test,  Echocardiogram, Cardiac CT, MRI, or Chest Xray during the period you would be wearing the  monitor. The patch cannot be worn during these tests. You cannot remove and re-apply the  ZIO XT patch monitor.  Your ZIO patch monitor will be mailed 3 day USPS to your address on file. It may take 3-5 days  to receive your monitor after you have been enrolled.  Once you have received your monitor, please review the enclosed instructions. Your monitor  has already been registered assigning a specific monitor serial # to you.  Billing and Patient Assistance Program Information  We have supplied [employer] with any of your insurance information on file for billing purposes. [employer] offers a sliding scale Patient Assistance Program for patients that do not have  insurance, or whose insurance does not completely cover the cost of the ZIO monitor.  You must apply for the Patient Assistance Program to qualify for this discounted rate.  To apply, please call [employer] at [phone], select  option 4, select option 2, ask to apply for  Patient Assistance Program. Meredeth Ide will ask your household income, and how many people  are in your household. They will quote your out-of-pocket cost based on that information.  [employer] will also be able to set up a 60-month, interest-free payment plan if needed.  Applying the monitor   Shave hair from upper left chest.  Hold abrader disc by orange tab. Rub abrader in 40 strokes over the upper left chest as  indicated in your monitor instructions.  Clean area with 4 enclosed alcohol pads. Let dry.  Apply patch as indicated in monitor instructions. Patch will be placed under collarbone on left  side of chest with arrow pointing upward.  Rub patch adhesive wings for 2 minutes. Remove white label marked "1". Remove the white  label marked "2". Rub patch adhesive wings for 2 additional minutes.  While looking in a mirror, press and release button in center of patch. A small green light will  flash 3-4 times. This will be your only indicator that the monitor has been turned on.  Do not shower for the first 24 hours. You may shower after the first 24 hours.  Press the button if you feel a symptom. You will hear a small click. Record Date, Time and  Symptom in the Patient Logbook.  When you are ready to remove the patch, follow instructions on the last 2 pages of Patient  Logbook. Stick patch monitor onto the last page of Patient Logbook.  Place Patient Logbook in the blue and white box. Use locking tab on box  and tape box closed  securely. The blue and white box has prepaid postage on it. Please place it in the mailbox as  soon as possible. Your physician should have your test results approximately 7 days after the  monitor has been mailed back to [facility].  Call [facility] Customer Care at [phone] if you have questions regarding  your ZIO XT patch monitor. Call them immediately if you see an orange light blinking on your  monitor.   If your monitor falls off in less than 4 days, contact our Monitor department at [phone].  If your monitor becomes loose or falls off after 4 days call [employer] at [phone] for  suggestions on securing your monitor    Follow-Up: At [facility], you and your health needs are our priority.  As part of our continuing mission to provide you with exceptional heart care, we have created designated Provider Care Teams.  These Care Teams include your primary Cardiologist (physician) and Advanced Practice Providers (APPs -  Physician Assistants and Nurse Practitioners) who all work together to provide you with the care you need, when you need it.  We recommend signing up for the patient portal called "MyChart".  Sign up information is provided on this After Visit Summary.  MyChart is used to connect with patients for Virtual Visits (Telemedicine).  Patients are able to view lab/test results, encounter notes, upcoming appointments, etc.  Non-urgent messages can be sent to your provider as well.   To learn more about what you can do with MyChart, go to [email or web address].    Your next appointment:   AS NEEDED

## 2022-08-14 DIAGNOSIS — G4733 Obstructive sleep apnea (adult) (pediatric): Secondary | ICD-10-CM | POA: Diagnosis not present

## 2022-08-21 ENCOUNTER — Encounter (HOSPITAL_BASED_OUTPATIENT_CLINIC_OR_DEPARTMENT_OTHER): Payer: Self-pay

## 2022-08-21 ENCOUNTER — Other Ambulatory Visit (HOSPITAL_BASED_OUTPATIENT_CLINIC_OR_DEPARTMENT_OTHER): Payer: BC Managed Care – PPO

## 2022-08-22 ENCOUNTER — Other Ambulatory Visit: Payer: Self-pay

## 2022-08-22 ENCOUNTER — Encounter: Payer: Self-pay | Admitting: Neurology

## 2022-08-22 ENCOUNTER — Encounter: Payer: Self-pay | Admitting: Family Medicine

## 2022-08-22 ENCOUNTER — Other Ambulatory Visit: Payer: Self-pay | Admitting: Neurology

## 2022-08-22 DIAGNOSIS — G8929 Other chronic pain: Secondary | ICD-10-CM

## 2022-08-22 DIAGNOSIS — G43709 Chronic migraine without aura, not intractable, without status migrainosus: Secondary | ICD-10-CM

## 2022-08-22 NOTE — Progress Notes (Signed)
Pt called requesting order for repeat lumbar ESI at [facility].  Order placed.

## 2022-08-23 ENCOUNTER — Ambulatory Visit (HOSPITAL_BASED_OUTPATIENT_CLINIC_OR_DEPARTMENT_OTHER)
Admission: RE | Admit: 2022-08-23 | Discharge: 2022-08-23 | Disposition: A | Discharge status: Home / Self Care | Payer: BC Managed Care – PPO | Source: Ambulatory Visit | Attending: Cardiovascular Disease | Admitting: Cardiovascular Disease

## 2022-08-23 ENCOUNTER — Telehealth: Payer: Self-pay

## 2022-08-23 ENCOUNTER — Other Ambulatory Visit (HOSPITAL_COMMUNITY): Payer: Self-pay

## 2022-08-23 ENCOUNTER — Telehealth: Payer: Self-pay | Admitting: *Deleted

## 2022-08-23 DIAGNOSIS — I509 Heart failure, unspecified: Secondary | ICD-10-CM | POA: Diagnosis not present

## 2022-08-23 DIAGNOSIS — I493 Ventricular premature depolarization: Secondary | ICD-10-CM

## 2022-08-23 LAB — ECHOCARDIOGRAM COMPLETE
Area-P 1/2: 4.17 cm2
S' Lateral: 3.5 cm

## 2022-08-23 NOTE — Telephone Encounter (Signed)
Pharmacy Patient Advocate Encounter  Received notification from [facility] that Prior Authorization for Eletriptan Hydrobromide 40MG  tablets has been APPROVED from 08/23/2022 to 08/22/2025. Ran test claim, Copay is $14.34 per 30DS which is a quantity of 18 Tablets which is all that the insurance would allow.  PA #/Case ID/Reference #: PA Case ID #: [id number]

## 2022-08-23 NOTE — Telephone Encounter (Signed)
Pt states pharmacy has told him he can't fill Eletriptan without a PA.

## 2022-08-23 NOTE — Telephone Encounter (Signed)
Pharmacy Patient Advocate Encounter   Received notification from Physician's Office that prior authorization for Eletriptan Hydrobromide 40MG  tablets is required/requested.   Insurance verification completed.   The patient is insured through [facility] .   Per test claim: PA submitted to [facility] via CoverMyMeds Key/confirmation #/EOC [id number] Status is pending

## 2022-08-23 NOTE — Telephone Encounter (Signed)
Sarah,  Pharmacy is requiring them to try: zolmitriptan (ZOMIG) 5 MG tablet SUMAtriptan (IMITREX) 50 MG tablet rizatriptan (MAXALT) 10 MG tablet zolmitriptan (ZOMIG-ZMT) 5 MG disintegrating tablet naratriptan (AMERGE) 2.5 MG tablet

## 2022-08-23 NOTE — Telephone Encounter (Signed)
PA request has been Submitted. New Encounter created for follow up. For additional info see Pharmacy Prior Auth telephone encounter from 08/23/2022.

## 2022-08-23 NOTE — Telephone Encounter (Signed)
Called pt informed him of message nurse Navos sent. Pt said thank you for the call.

## 2022-08-24 ENCOUNTER — Other Ambulatory Visit (HOSPITAL_COMMUNITY): Payer: Self-pay

## 2022-08-24 ENCOUNTER — Encounter: Payer: Self-pay | Admitting: Radiology

## 2022-08-24 ENCOUNTER — Other Ambulatory Visit: Payer: Self-pay | Admitting: Sports Medicine

## 2022-08-24 DIAGNOSIS — G8929 Other chronic pain: Secondary | ICD-10-CM

## 2022-08-24 NOTE — Telephone Encounter (Signed)
Pharmacy Patient Advocate Encounter  Received notification from [facility] that Prior Authorization for Eletriptan Hydrobromide 40MG  tablets has been APPROVED from 08/23/2022 to 08/22/2025. Ran test claim, Copay is $14.34 per 18 tabs [employer] monthly allowance).  PA #/Case ID/Reference #: PA Case ID #: [id number]

## 2022-08-25 ENCOUNTER — Other Ambulatory Visit (HOSPITAL_BASED_OUTPATIENT_CLINIC_OR_DEPARTMENT_OTHER): Payer: BC Managed Care – PPO

## 2022-08-28 DIAGNOSIS — I493 Ventricular premature depolarization: Secondary | ICD-10-CM

## 2022-08-29 ENCOUNTER — Other Ambulatory Visit: Payer: Self-pay | Admitting: *Deleted

## 2022-08-29 MED ORDER — DIAZEPAM 5 MG PO TABS: ORAL_TABLET | ORAL | 0 refills | Status: DC

## 2022-08-30 ENCOUNTER — Other Ambulatory Visit: Payer: BC Managed Care – PPO

## 2022-08-31 ENCOUNTER — Ambulatory Visit
Admission: RE | Admit: 2022-08-31 | Discharge: 2022-08-31 | Disposition: A | Discharge status: Home / Self Care | Payer: BC Managed Care – PPO | Source: Ambulatory Visit | Attending: Sports Medicine | Admitting: Sports Medicine

## 2022-08-31 ENCOUNTER — Ambulatory Visit: Payer: BC Managed Care – PPO | Admitting: Family Medicine

## 2022-08-31 DIAGNOSIS — M47817 Spondylosis without myelopathy or radiculopathy, lumbosacral region: Secondary | ICD-10-CM | POA: Diagnosis not present

## 2022-08-31 DIAGNOSIS — G8929 Other chronic pain: Secondary | ICD-10-CM

## 2022-08-31 MED ORDER — METHYLPREDNISOLONE ACETATE 40 MG/ML INJ SUSP (RADIOLOG
80.0000 mg | Freq: Once | INTRAMUSCULAR | Status: AC
  Administered 2022-08-31: 80 mg via EPIDURAL

## 2022-08-31 MED ORDER — IOPAMIDOL (ISOVUE-M 200) INJECTION 41%
1.0000 mL | Freq: Once | INTRAMUSCULAR | Status: AC
  Administered 2022-08-31: 1 mL via EPIDURAL

## 2022-08-31 NOTE — Discharge Instructions (Addendum)
Post Procedure Spinal Discharge Instruction Sheet  You may resume a regular diet and any medications that you routinely take (including pain medications) unless otherwise noted by MD.  No driving day of procedure.  Light activity throughout the rest of the day.  Do not do any strenuous work, exercise, bending or lifting.  The day following the procedure, you can resume normal physical activity but you should refrain from exercising or physical therapy for at least three days thereafter.  You may apply ice to the injection site, 20 minutes on, 20 minutes off, as needed. Do not apply ice directly to skin.    Common Side Effects:  Headaches- take your usual medications as directed by your physician.  Increase your fluid intake.  Caffeinated beverages may be helpful.  Lie flat in bed until your headache resolves.  Restlessness or inability to sleep- you may have trouble sleeping for the next few days.  Ask your referring physician if you need any medication for sleep.  Facial flushing or redness- should subside within a few days.  Increased pain- a temporary increase in pain a day or two following your procedure is not unusual.  Take your pain medication as prescribed by your referring physician.  Leg cramps  Please contact our office at [phone] for the following symptoms: Fever greater than 100 degrees. Headaches unresolved with medication after 2-3 days. Increased swelling, pain, or redness at injection site.  YOU MAY RESUME YOUR EXCEDRIN WITH ASPIRIN TODAY, POST PROCEDURE.  Thank you for visiting [name] today.

## 2022-09-01 ENCOUNTER — Encounter: Payer: Self-pay | Admitting: Family Medicine

## 2022-09-01 ENCOUNTER — Ambulatory Visit (INDEPENDENT_AMBULATORY_CARE_PROVIDER_SITE_OTHER): Payer: BC Managed Care – PPO | Admitting: Family Medicine

## 2022-09-01 VITALS — BP 120/72 | HR 84 | Temp 97.4°F | Ht 73.0 in | Wt 223.4 lb

## 2022-09-01 DIAGNOSIS — E039 Hypothyroidism, unspecified: Secondary | ICD-10-CM

## 2022-09-01 DIAGNOSIS — Z23 Encounter for immunization: Secondary | ICD-10-CM

## 2022-09-01 DIAGNOSIS — F411 Generalized anxiety disorder: Secondary | ICD-10-CM

## 2022-09-01 DIAGNOSIS — G8929 Other chronic pain: Secondary | ICD-10-CM | POA: Diagnosis not present

## 2022-09-01 DIAGNOSIS — E785 Hyperlipidemia, unspecified: Secondary | ICD-10-CM | POA: Diagnosis not present

## 2022-09-01 DIAGNOSIS — K589 Irritable bowel syndrome without diarrhea: Secondary | ICD-10-CM

## 2022-09-01 DIAGNOSIS — M545 Low back pain, unspecified: Secondary | ICD-10-CM | POA: Diagnosis not present

## 2022-09-01 MED ORDER — HYDROCODONE-ACETAMINOPHEN 10-325 MG PO TABS: 1.0000 | ORAL_TABLET | Freq: Two times a day (BID) | ORAL | 0 refills | Status: DC | PRN

## 2022-09-01 NOTE — Progress Notes (Signed)
Phone [phone] In person visit   Subjective:   Joseph Fuentes is a 67 y.o. year old very pleasant male patient who presents for/with See problem oriented charting Chief Complaint  Patient presents with   3 month f/u   Past Medical History-  Patient Active Problem List   Diagnosis Date Noted   Chronic back pain on long term narcotics 02/17/2022    Priority: High   Drug-induced myopathy 02/17/2022    Priority: Medium    Hypothyroidism 02/17/2022    Priority: Medium    BPH associated with nocturia 02/17/2022    Priority: Medium    OSA on CPAP 02/17/2022    Priority: Medium    Memory change 02/07/2022    Priority: Medium    Chronic migraine without aura without status migrainosus, not intractable 05/12/2020    Priority: Medium    Hyperlipidemia, unspecified 04/20/2009    Priority: Medium    Anxiety state 04/20/2009    Priority: Medium    GERD 03/10/2009    Priority: Medium    Irritable bowel syndrome 03/10/2009    Priority: Medium    Other allergic rhinitis 04/24/2019    Priority: Low   Polyneuropathy 11/13/2018    Priority: Low   Gastroparesis 04/07/2009    Priority: Low   Pain in joint, shoulder region 08/19/2014    Priority: 1.   Left knee pain 04/30/2013    Priority: 1.   Plantar fasciitis 03/06/2013    Priority: 1.    Medications- reviewed and updated Current Outpatient Medications  Medication Sig Dispense Refill   ALLEGRA-D ALLERGY & CONGESTION 180-240 MG 24 hr tablet Take 1 tablet by mouth daily.   3   desoximetasone (TOPICORT) 0.25 % cream Apply 1 application topically daily as needed.     DEXILANT 60 MG capsule Take 60 mg by mouth every other day.     docusate sodium (COLACE) 100 MG capsule Take 100 mg by mouth daily.      fluticasone (CUTIVATE) 0.005 % ointment Apply 1 application topically 2 (two) times daily.     fluticasone (FLONASE) 50 MCG/ACT nasal spray Place 2 sprays into both nostrils daily. 48 g 3   ibuprofen (ADVIL) 800 MG tablet Take 1  tablet (800 mg total) by mouth 2 (two) times daily as needed. 180 tablet 3   ipratropium (ATROVENT) 0.06 % nasal spray 1 spray in each nostril 2 times daily 45 mL 3   NAFTIN 2 % CREA as needed.     omeprazole (PRILOSEC) 40 MG capsule Take 1-2 tablets by mouth at bedtime.      POTASSIUM PO Take 99 mg by mouth.      SYNTHROID 112 MCG tablet Take 112 mcg by mouth daily before breakfast.     tamsulosin (FLOMAX) 0.4 MG CAPS capsule 1 capsule 30 minutes after the same meal each day     topiramate (TOPAMAX) 200 MG tablet Take 1 tablet (200 mg total) by mouth daily. 90 tablet 4   traZODone (DESYREL) 50 MG tablet TAKE 0.5-1 TABLETS BY MOUTH AT BEDTIME AS NEEDED FOR SLEEP. 90 tablet 2   triamcinolone ointment (KENALOG) 0.1 % as needed.     WELCHOL 625 MG tablet Take 3 tablets by mouth Twice daily.     aspirin-acetaminophen-caffeine (EXCEDRIN MIGRAINE) 250-250-65 MG tablet Take by mouth every 6 (six) hours as needed for headache. (Patient not taking: Reported on 09/01/2022)     diazepam (VALIUM) 5 MG tablet Take one pill one hour before your MRI You must  have someone drive you to the MRI appt (Patient not taking: Reported on 09/01/2022) 2 tablet 0   HYDROcodone-acetaminophen (NORCO) 10-325 MG tablet Take 1 tablet by mouth 2 (two) times daily as needed (do not drive for 8 hours after taking). 60 tablet 0   zolmitriptan (ZOMIG) 5 MG tablet Take 1 tablet (5 mg total) by mouth as needed for migraine (may repeat in 2 hours not to exceed 2 tablets in 1 day). (Patient not taking: Reported on 09/01/2022) 12 tablet 11   No current facility-administered medications for this visit.     Objective:  BP 120/72   Pulse 84   Temp (!) 97.4 F (36.3 C)   Ht 6\' 1"  (1.854 m)   Wt 223 lb 6.4 oz (101.3 kg)   SpO2 98%   BMI 29.47 kg/m  Gen: NAD, resting comfortably CV: RRR occasional ectopic beats Lungs: CTAB no crackles, wheeze, rhonchi  Ext: no edema Skin: warm, dry    Assessment and Plan   #Trigeminy thought to be  discovered last visit but cardiology notes only has PVCs- referred to cardiology last visit. Thyroid levels were normal TSH, t3, t4. ZIo patch 3 days-recommended to measure PVC burden is still processing.  He also has CT calcium scoring pending.  Echocardiogram largely reassuring other than some diastolic dysfunction   #Chronic back pain S: medication: hydrocodone 10-325 per primary care doctor Dr. [name]- 60 tablets lasts 90 days - uses prn with activity - had been pretty painful until epidural Dr. Mosetta Putt at [facility] L3- L4- was helpful  -his last fill was may 6th and fortunately even with worsening pain  - he hasn't needed quite as much- has balanced out as playing less golf which is a usual trigger - has surgery this month on 28th - small tear in the hip likely labrum- and has upcoming surgery with Dr. Berton Lan -has never seen pain management A/P: doing reasonably well- we would use this visit to prescribe for the next time he needs the hydrocodone and then he would see [place] 3 months after that -encouraged him to be cautious with ibuprofen with last creatinine at 1.34 -check kidney function next isit  #hyperlipidemia #IBS S: Medication:WelChol 625 mg 3 tablets twice daily (has to space from thyroid medicine) -didn't tolerate statins due to myalgia -ended up on welchol and had essential resolution of IBS  A/P: WelChol has been helpful for cholesterol and his IBS-since WelChol will not be covered on his new insurance we will have to look for other solutions - For hyperlipidemia which she reports has been controlled cardiology is considering PCSK9 inhibitors-likely update lipid panel next visit to have 1 on file with [place]   #hypothyroidism- with Dr. Sharl Ma due to oscillatoins S: compliant On thyroid medication-Synthroid 224 mcg -welchol not ideal for thyroid per Dr. Sharl Ma Lab Results  Component Value Date   TSH 0.94 06/30/2022  A/P: Has been well-controlled-levels were stable at last visit-continue current  medication  # BPH S: Medication: Tamsulosin 0.4 mg  A/P: stable- continue current medicines     # Migraines-follows with neurology Dr. Trevor Mace: Medication: Relpax as needed- has not used zomig or even picked up, Topamax 200 mg -ajovy not covered A/P: Reasonable control lately-continue current medication and neurology follow-up    # Anxiety/insomnia S:Medication: Trial trazodone April 2024- still doing well -In the past alprazolam,  Ambien 12.5 mg extended release -antidepressants/SSRI in past- severe libido decrease, range of emotion restriction - axiety has not been issue lately A/P:  doing well and sleeping well - continue current medications    # GERD-prescribed by Deboraha Sprang Dr. Ewing Schlein S:Medication: Dexilant 60 mg and  omeprazole at night but still has issues   A/P:-Continue current medication and GI follow-up-with long-term PPI use likely check B12 next visit  #memory loss- essentially resolved with stopping Ambien, Xanax and getting better sleep with trazodone-prior to this was getting worked up with neurology  Recommended follow up: Glad you are doing reasonably well- we refilled your medicine- schedule follow up when you are getting within about a month of being out  Lab/Order associations:   ICD-10-CM   1. Chronic low back pain, unspecified back pain laterality, unspecified whether sciatica present  M54.50    G89.29     2. Hyperlipidemia, unspecified hyperlipidemia type  E78.5     3. Hypothyroidism, unspecified type  E03.9     4. Need for pneumococcal 20-valent conjugate vaccination  Z23 Pneumococcal conjugate vaccine 20-valent (Prevnar 20)    5. Anxiety state  F41.1     6. Irritable bowel syndrome, unspecified type  K58.9       Meds ordered this encounter  Medications   HYDROcodone-acetaminophen (NORCO) 10-325 MG tablet    Sig: Take 1 tablet by mouth 2 (two) times daily as needed (do not drive for 8 hours after taking).    Dispense:  60 tablet    Refill:  0     Return precautions advised.  Tana Conch, MD

## 2022-09-01 NOTE — Patient Instructions (Addendum)
Let us know if you  get your flu or COVID vaccine.  Glad you are doing reasonably well- we refilled your medicine- schedule follow up when you are getting within about a month of being out  Labs next visit to monitor kidneys- limit ibuprofen if you can  Prevnar 20 if staff still here to give it- otherwise can get at pharmacy or here next visit

## 2022-09-14 DIAGNOSIS — G4733 Obstructive sleep apnea (adult) (pediatric): Secondary | ICD-10-CM | POA: Diagnosis not present

## 2022-09-15 ENCOUNTER — Encounter: Payer: Self-pay | Admitting: Cardiovascular Disease

## 2022-09-18 ENCOUNTER — Other Ambulatory Visit (HOSPITAL_BASED_OUTPATIENT_CLINIC_OR_DEPARTMENT_OTHER): Payer: Self-pay | Admitting: *Deleted

## 2022-09-18 MED ORDER — EZETIMIBE 10 MG PO TABS: 10.0000 mg | ORAL_TABLET | Freq: Every day | ORAL | 3 refills | Status: DC

## 2022-09-18 NOTE — Telephone Encounter (Signed)
Spoke to patient.he states he will try Zetia   Medication e-sent to mail order

## 2022-09-27 DIAGNOSIS — X58XXXA Exposure to other specified factors, initial encounter: Secondary | ICD-10-CM | POA: Diagnosis not present

## 2022-09-27 DIAGNOSIS — M7061 Trochanteric bursitis, right hip: Secondary | ICD-10-CM | POA: Diagnosis not present

## 2022-09-27 DIAGNOSIS — Y999 Unspecified external cause status: Secondary | ICD-10-CM | POA: Diagnosis not present

## 2022-09-27 DIAGNOSIS — S76011A Strain of muscle, fascia and tendon of right hip, initial encounter: Secondary | ICD-10-CM | POA: Diagnosis not present

## 2022-09-27 DIAGNOSIS — S76311A Strain of muscle, fascia and tendon of the posterior muscle group at thigh level, right thigh, initial encounter: Secondary | ICD-10-CM | POA: Diagnosis not present

## 2022-10-16 DIAGNOSIS — G4733 Obstructive sleep apnea (adult) (pediatric): Secondary | ICD-10-CM | POA: Diagnosis not present

## 2022-10-17 DIAGNOSIS — G4733 Obstructive sleep apnea (adult) (pediatric): Secondary | ICD-10-CM | POA: Diagnosis not present

## 2022-11-11 ENCOUNTER — Other Ambulatory Visit: Payer: Self-pay | Admitting: Neurology

## 2022-11-11 DIAGNOSIS — G43709 Chronic migraine without aura, not intractable, without status migrainosus: Secondary | ICD-10-CM

## 2022-11-15 DIAGNOSIS — G4733 Obstructive sleep apnea (adult) (pediatric): Secondary | ICD-10-CM | POA: Diagnosis not present

## 2022-11-24 DIAGNOSIS — H18413 Arcus senilis, bilateral: Secondary | ICD-10-CM | POA: Diagnosis not present

## 2022-11-24 DIAGNOSIS — H25043 Posterior subcapsular polar age-related cataract, bilateral: Secondary | ICD-10-CM | POA: Diagnosis not present

## 2022-11-24 DIAGNOSIS — H25013 Cortical age-related cataract, bilateral: Secondary | ICD-10-CM | POA: Diagnosis not present

## 2022-11-24 DIAGNOSIS — H2511 Age-related nuclear cataract, right eye: Secondary | ICD-10-CM | POA: Diagnosis not present

## 2022-11-24 DIAGNOSIS — H2513 Age-related nuclear cataract, bilateral: Secondary | ICD-10-CM | POA: Diagnosis not present

## 2022-12-16 DIAGNOSIS — G4733 Obstructive sleep apnea (adult) (pediatric): Secondary | ICD-10-CM | POA: Diagnosis not present

## 2022-12-25 DIAGNOSIS — H2512 Age-related nuclear cataract, left eye: Secondary | ICD-10-CM | POA: Diagnosis not present

## 2022-12-26 DIAGNOSIS — H2511 Age-related nuclear cataract, right eye: Secondary | ICD-10-CM | POA: Diagnosis not present

## 2022-12-31 NOTE — Progress Notes (Unsigned)
Cardiology Office Note:  .   Date:  01/01/2023  ID:  Joseph Fuentes, DOB 1955/12/04, MRN [id number] PCP: Shelva Majestic, MD  [facility] Providers Cardiologist:  None { History of Present Illness: Joseph Fuentes is a 67 y.o. male with history of PVCs who presents for follow-up.   History of Present Illness   Joseph Fuentes, a 67 year old male with a history of premature ventricular contractions (PVCs), peripheral artery disease (PAD), hyperlipidemia, and former tobacco abuse, presents for follow-up. He recently underwent monitoring for PVCs, which showed a 9.9% burden, and an echocardiogram, which was normal. Despite these findings, he reports no symptoms such as heart racing or skipping beats.  He also underwent a calcium score test, which revealed some calcium in the heart arteries. However, he denies any chest pain or other related symptoms. He has been prescribed Zetia for his hyperlipidemia, as he is intolerant to statins. He reports no joint pain, a common side effect of statins, since starting Zetia. He was previously on Welchol, which he believes helped manage his irritable bowel syndrome (IBS). He is considering discontinuing Welchol and managing his IBS separately.   He reports no CP or SOB.           Problem List PAD -aortic and iliac artery calcifications  -negative ABI 2019 HLD -T chol 196, HDL 50, LDL 128, TG 102 -statin intolerant 3. Former tobacco abuse  4. PVCs -9.9% burden  -EF 60-65% 5. CAD -CAC 239 (70th percentile)    ROS: All other ROS reviewed and negative. Pertinent positives noted in the HPI.     Studies Reviewed: Joseph Kitchen       Physical Exam:   VS:  BP (!) 140/80 (BP Location: Right Arm, Patient Position: Sitting)   Pulse 64   Ht 6' (1.829 m)   Wt 234 lb 9.6 oz (106.4 kg)   SpO2 99%   BMI 31.82 kg/m    Wt Readings from Last 3 Encounters:  01/01/23 234 lb 9.6 oz (106.4 kg)  09/01/22 223 lb 6.4 oz (101.3 kg)  08/08/22 224 lb (101.6 kg)     GEN: Well nourished, well developed in no acute distress NECK: No JVD; No carotid bruits CARDIAC: RRR, no murmurs, rubs, gallops RESPIRATORY:  Clear to auscultation without rales, wheezing or rhonchi  ABDOMEN: Soft, non-tender, non-distended EXTREMITIES:  No edema; No deformity  ASSESSMENT AND PLAN: .   Assessment and Plan    Premature Ventricular Contractions (PVCs) PVC burden of 9.9% with no associated symptoms. Normal echocardiogram. -Continue monitoring, no aggressive treatment needed at this time.  Hyperlipidemia Intolerance to statins, currently on Zetia 10mg  daily. Patient considering discontinuation of Welchol due to potential Medicare coverage issues. -Continue Zetia 10mg  daily. -Discontinue Welchol if patient desires and monitor for changes in IBS symptoms. -Recheck lipid panel with primary care physician and communicate results for further assessment.  Coronary Artery Disease (CAD) Calcium score of 239 (70th percentile), no associated symptoms. -Initiate Aspirin 81mg  daily for CAD prevention. -Continue Zetia 10mg  daily for cholesterol management.  Peripheral Artery Disease (PAD) Evidence of aortic and iliac artery calcifications, negative ABIs. -Continue current cholesterol regimen (Zetia 10mg  daily) for management.  Borderline Hypertension Blood pressure 140/80. -Continue to monitor blood pressure.  Follow-up in one year unless new symptoms arise or changes occur in lipid panel.              Follow-up: Return in about 1 year (around 01/01/2024).  Time Spent with Patient: I have spent  a total of 25 minutes caring for this patient today face to face, ordering and reviewing labs/tests, reviewing prior records/medical history, examining the patient, establishing an assessment and plan, communicating results/findings to the patient/family, and documenting in the medical record.   Signed, Lenna Gilford. Flora Lipps, MD, [facility]  [facility]  [address],  [address] [place], [place] [address] [phone]  5:21 PM

## 2023-01-01 ENCOUNTER — Encounter: Payer: Self-pay | Admitting: Cardiovascular Disease

## 2023-01-01 ENCOUNTER — Ambulatory Visit: Payer: BC Managed Care – PPO | Attending: Cardiovascular Disease | Admitting: Cardiovascular Disease

## 2023-01-01 VITALS — BP 140/80 | HR 64 | Ht 72.0 in | Wt 234.6 lb

## 2023-01-01 DIAGNOSIS — R931 Abnormal findings on diagnostic imaging of heart and coronary circulation: Secondary | ICD-10-CM | POA: Diagnosis not present

## 2023-01-01 DIAGNOSIS — E782 Mixed hyperlipidemia: Secondary | ICD-10-CM | POA: Diagnosis not present

## 2023-01-01 DIAGNOSIS — I493 Ventricular premature depolarization: Secondary | ICD-10-CM

## 2023-01-01 NOTE — Patient Instructions (Signed)
Medication Instructions:  - START ASPIRIN 81MG  DAILY    *If you need a refill on your cardiac medications before your next appointment, please call your pharmacy*   Lab Work: NONE    If you have labs (blood work) drawn today and your tests are completely normal, you will receive your results only by: MyChart Message (if you have MyChart) OR A paper copy in the mail If you have any lab test that is abnormal or we need to change your treatment, we will call you to review the results.   Testing/Procedures:  NONE   Follow-Up: At [facility], you and your health needs are our priority.  As part of our continuing mission to provide you with exceptional heart care, we have created designated Provider Care Teams.  These Care Teams include your primary Cardiologist (physician) and Advanced Practice Providers (APPs -  Physician Assistants and Nurse Practitioners) who all work together to provide you with the care you need, when you need it.  We recommend signing up for the patient portal called "MyChart".  Sign up information is provided on this After Visit Summary.  MyChart is used to connect with patients for Virtual Visits (Telemedicine).  Patients are able to view lab/test results, encounter notes, upcoming appointments, etc.  Non-urgent messages can be sent to your provider as well.   To learn more about what you can do with MyChart, go to [email or web address].    Your next appointment:   1 year(s)  The format for your next appointment:   In Person  Provider:   Dr. Gerri Spore O'NEAL   Other Instructions

## 2023-01-08 DIAGNOSIS — H2511 Age-related nuclear cataract, right eye: Secondary | ICD-10-CM | POA: Diagnosis not present

## 2023-01-12 DIAGNOSIS — E039 Hypothyroidism, unspecified: Secondary | ICD-10-CM | POA: Diagnosis not present

## 2023-01-12 DIAGNOSIS — E782 Mixed hyperlipidemia: Secondary | ICD-10-CM | POA: Diagnosis not present

## 2023-01-12 DIAGNOSIS — E063 Autoimmune thyroiditis: Secondary | ICD-10-CM | POA: Diagnosis not present

## 2023-01-15 DIAGNOSIS — G4733 Obstructive sleep apnea (adult) (pediatric): Secondary | ICD-10-CM | POA: Diagnosis not present

## 2023-01-16 DIAGNOSIS — G4733 Obstructive sleep apnea (adult) (pediatric): Secondary | ICD-10-CM | POA: Diagnosis not present

## 2023-01-19 DIAGNOSIS — E669 Obesity, unspecified: Secondary | ICD-10-CM | POA: Diagnosis not present

## 2023-01-19 DIAGNOSIS — E039 Hypothyroidism, unspecified: Secondary | ICD-10-CM | POA: Diagnosis not present

## 2023-01-19 DIAGNOSIS — Z131 Encounter for screening for diabetes mellitus: Secondary | ICD-10-CM | POA: Diagnosis not present

## 2023-01-19 DIAGNOSIS — K219 Gastro-esophageal reflux disease without esophagitis: Secondary | ICD-10-CM | POA: Diagnosis not present

## 2023-01-19 DIAGNOSIS — Z87898 Personal history of other specified conditions: Secondary | ICD-10-CM | POA: Diagnosis not present

## 2023-01-19 DIAGNOSIS — E063 Autoimmune thyroiditis: Secondary | ICD-10-CM | POA: Diagnosis not present

## 2023-01-19 DIAGNOSIS — D7589 Other specified diseases of blood and blood-forming organs: Secondary | ICD-10-CM | POA: Diagnosis not present

## 2023-01-29 ENCOUNTER — Other Ambulatory Visit: Payer: Self-pay | Admitting: Family Medicine

## 2023-01-29 DIAGNOSIS — K317 Polyp of stomach and duodenum: Secondary | ICD-10-CM | POA: Diagnosis not present

## 2023-01-29 DIAGNOSIS — D131 Benign neoplasm of stomach: Secondary | ICD-10-CM | POA: Diagnosis not present

## 2023-01-30 ENCOUNTER — Telehealth: Payer: Self-pay | Admitting: Family Medicine

## 2023-01-30 NOTE — Telephone Encounter (Signed)
 Copied from CRM [id number]. Topic: Clinical - Medication Refill >> Jan 30, 2023  1:03 PM Victoria A wrote: Most Recent Primary Care Visit:  Provider: KATRINKA GARNETTE KIDD  Department: [facility]  Visit Type: OFFICE VISIT  Date: 09/01/2022  Medication: WELCHOL  625 MG tablet; Patient asks if this medication could be refilled today due to the cost  ibuprofen  (ADVIL ) 800 MG tablet HYDROcodone -acetaminophen  (NORCO) 10-325 MG tablet   Has the patient contacted their pharmacy? Yes (Agent: If no, request that the patient contact the pharmacy for the refill. If patient does not wish to contact the pharmacy document the reason why and proceed with request.) (Agent: If yes, when and what did the pharmacy advise?)  Is this the correct pharmacy for this prescription? Yes If no, delete pharmacy and type the correct one.  This is the patient's preferred pharmacy:  [id number] [place], [place] - [address] [place] [address] Phone: [phone] Fax: [phone]  [facility] [place], [place] - [address] [facility] AT [address] to [facility] [address] [place] [address] Phone: [phone] Fax: [phone]  [id number] [place], [place] - [address] AT [address] [facility] [address] [place] [address] Phone: [phone] Fax: [phone]   Has the prescription been filled recently? No  Is the patient out of the medication? Yes  Has the patient been seen for an appointment in the last year OR does the patient have an upcoming appointment? Yes  Can we respond through MyChart? No  Agent: Please be advised that Rx refills may take up to 3 business days. We ask that you follow-up with your pharmacy.

## 2023-02-01 MED ORDER — COLESEVELAM HCL 625 MG PO TABS: 1875.0000 mg | ORAL_TABLET | Freq: Two times a day (BID) | ORAL | 3 refills | Status: DC

## 2023-02-01 NOTE — Telephone Encounter (Signed)
You may refill °

## 2023-02-01 NOTE — Telephone Encounter (Signed)
 Copied from CRM [id number]. Topic: Clinical - Prescription Issue >> Jan 30, 2023  3:38 PM Drema MATSU wrote: Reason for CRM: Patient stated that the [facility] does not have medication in stock. [facility] ([address] the medication.  Patient is out of medication and he really needs it today and if he can't it will cost cost a lot of money for medication. >> Jan 30, 2023  3:51 PM Drema MATSU wrote: [facility] stated that [facility] [address] [facility] not [facility] Welchol  in stock

## 2023-02-01 NOTE — Telephone Encounter (Signed)
 [facility] [address] per pt request.

## 2023-02-15 DIAGNOSIS — G4733 Obstructive sleep apnea (adult) (pediatric): Secondary | ICD-10-CM | POA: Diagnosis not present

## 2023-02-19 ENCOUNTER — Other Ambulatory Visit: Payer: Self-pay | Admitting: Family Medicine

## 2023-02-21 ENCOUNTER — Encounter: Payer: Self-pay | Admitting: Family Medicine

## 2023-02-21 ENCOUNTER — Other Ambulatory Visit: Payer: Self-pay

## 2023-02-21 MED ORDER — COLESEVELAM HCL 625 MG PO TABS: 625.0000 mg | ORAL_TABLET | Freq: Two times a day (BID) | ORAL | 3 refills | Status: DC

## 2023-02-21 MED ORDER — IBUPROFEN 800 MG PO TABS: 800.0000 mg | ORAL_TABLET | Freq: Two times a day (BID) | ORAL | 3 refills | Status: DC | PRN

## 2023-02-21 NOTE — Progress Notes (Signed)
Narcotics need office visit-please see my follow-up from last note recommendation

## 2023-02-21 NOTE — Progress Notes (Signed)
Please schedule ov for pt for narcotic refill per Dr. Durene Cal.

## 2023-02-22 ENCOUNTER — Other Ambulatory Visit: Payer: Self-pay | Admitting: Neurology

## 2023-02-22 DIAGNOSIS — G43709 Chronic migraine without aura, not intractable, without status migrainosus: Secondary | ICD-10-CM

## 2023-02-27 ENCOUNTER — Ambulatory Visit: Payer: BC Managed Care – PPO | Admitting: Family Medicine

## 2023-02-27 ENCOUNTER — Encounter: Payer: Self-pay | Admitting: Family Medicine

## 2023-02-27 MED ORDER — HYDROCODONE-ACETAMINOPHEN 10-325 MG PO TABS: 1.0000 | ORAL_TABLET | Freq: Two times a day (BID) | ORAL | 0 refills | Status: DC | PRN

## 2023-02-27 NOTE — Patient Instructions (Addendum)
Recommended follow up: Return in about 3 months (around 05/28/2023) for followup or sooner if needed.Schedule b4 you leave.

## 2023-02-27 NOTE — Progress Notes (Signed)
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 Despina Hick but he has finished that and still well over 3 months for the #60 as per our original plan - we discussed scheduling 3 month follow up in person

## 2023-02-28 NOTE — Telephone Encounter (Signed)
See below, PA needed for Hydrocodone.

## 2023-03-02 ENCOUNTER — Encounter: Payer: Self-pay | Admitting: Family Medicine

## 2023-03-05 ENCOUNTER — Other Ambulatory Visit: Payer: Self-pay

## 2023-03-05 MED ORDER — COLESEVELAM HCL 625 MG PO TABS: 1875.0000 mg | ORAL_TABLET | Freq: Three times a day (TID) | ORAL | 3 refills | Status: DC

## 2023-03-06 ENCOUNTER — Other Ambulatory Visit: Payer: Self-pay | Admitting: Family Medicine

## 2023-03-06 ENCOUNTER — Other Ambulatory Visit (HOSPITAL_COMMUNITY): Payer: Self-pay

## 2023-03-06 ENCOUNTER — Telehealth: Payer: Self-pay

## 2023-03-06 NOTE — Telephone Encounter (Signed)
*  Primary  Pharmacy Patient Advocate Encounter  Received notification from [facility] that Prior Authorization for HYDROcodone -Acetaminophen  10-325MG  tablets  has been APPROVED from 03/06/2023 to 09/02/2023. Ran test claim, Copay is $8.31. This test claim was processed through [facility]- copay amounts may vary at other pharmacies due to pharmacy/plan contracts, or as the patient moves through the different stages of their insurance plan.   PA #/Case ID/Reference #: [id number]

## 2023-03-06 NOTE — Telephone Encounter (Signed)
PA request has been Submitted. New Encounter created for follow up. For additional info see Pharmacy Prior Auth telephone encounter from 02/04.

## 2023-03-07 NOTE — Telephone Encounter (Signed)
See pharmacy message below

## 2023-03-12 ENCOUNTER — Telehealth: Payer: Self-pay

## 2023-03-12 NOTE — Telephone Encounter (Signed)
 Copied from CRM [id number]. Topic: General - Other >> Mar 09, 2023 11:36 AM Howard Macho wrote: Reason for [id number] [facility] stating they need clarification on the medication welchol  625mg  because it is a high dose. Sheryl stated they want to know if they can fill as is and they will not fill until they hear back from the doctor CB [phone] option 2 Reference# [id number]  Please Advise.  Message has been sent to provider to address

## 2023-03-12 NOTE — Telephone Encounter (Signed)
 https://www.mayoclinic.org/drugs-supplements/colesevelam -oral-route/description/drg-20063133 For high cholesterol: Adults--6 tablets each day. This may be taken as a single dose or 3 tablets 2 times a day. Each tablet contains 625 milligrams (mg) of colesevelam .  Team please call pharmacy and update prescription.  WelChol  625 mg 3 tablets twice a day #540 with 3 refills.  This is the recommendation from [facility]-I am not sure why they are saying this is a high dose  We had already responded by sending a fax back to them about this

## 2023-03-13 NOTE — Telephone Encounter (Signed)
Spoke with pharmacy

## 2023-03-18 DIAGNOSIS — G4733 Obstructive sleep apnea (adult) (pediatric): Secondary | ICD-10-CM | POA: Diagnosis not present

## 2023-03-25 ENCOUNTER — Other Ambulatory Visit: Payer: Self-pay | Admitting: Neurology

## 2023-03-25 DIAGNOSIS — G43709 Chronic migraine without aura, not intractable, without status migrainosus: Secondary | ICD-10-CM

## 2023-03-26 ENCOUNTER — Telehealth: Payer: Self-pay

## 2023-03-26 ENCOUNTER — Other Ambulatory Visit: Payer: Self-pay | Admitting: Neurology

## 2023-03-26 DIAGNOSIS — G43709 Chronic migraine without aura, not intractable, without status migrainosus: Secondary | ICD-10-CM

## 2023-03-26 NOTE — Telephone Encounter (Signed)
 Rx refilled.

## 2023-03-26 NOTE — Telephone Encounter (Signed)
 Imitrex pa needed

## 2023-03-29 ENCOUNTER — Encounter: Payer: Self-pay | Admitting: Sports Medicine

## 2023-03-29 ENCOUNTER — Ambulatory Visit (INDEPENDENT_AMBULATORY_CARE_PROVIDER_SITE_OTHER): Payer: BC Managed Care – PPO | Admitting: Sports Medicine

## 2023-03-29 VITALS — BP 128/80 | Ht 72.0 in | Wt 236.0 lb

## 2023-03-29 DIAGNOSIS — G8929 Other chronic pain: Secondary | ICD-10-CM

## 2023-03-29 DIAGNOSIS — M545 Low back pain, unspecified: Secondary | ICD-10-CM

## 2023-03-29 NOTE — Progress Notes (Signed)
   Subjective:    Patient ID: Joseph Fuentes, male    DOB: [birth year], 68 y.o.   MRN: [id number]  HPI chief complaint low back pain  Joseph Fuentes presents today with returning low back pain.  This is a chronic issue for him.  He does well with intermittent epidural steroid injections and would like to have another one.  Last injection was in August.  His pain is identical to what he has experienced previously.  No recent trauma.    Review of Systems As above    Objective:   Physical Exam  Well-developed, well-nourished.  No acute distress  Lumbar spine: No tenderness to palpation along the lumbar midline.  Good range of motion.  No spasm.  Neurological exam: Reflexes are equal at the Achilles and patellar tendons bilaterally.  No focal strength deficits.      Assessment & Plan:   Chronic low back pain  We will order a repeat epidural steroid injection to be done at [facility] imaging.  If he does not get the same pain improvement that he has gotten with these injections previously then he will notify me and we will consider updated x-rays of his lumbar spine.  Otherwise, follow-up as needed.  This note was dictated using Dragon naturally speaking software and may contain errors in syntax, spelling, or content which have not been identified prior to signing this note.

## 2023-04-04 ENCOUNTER — Encounter: Payer: Self-pay | Admitting: Sports Medicine

## 2023-04-06 NOTE — Discharge Instructions (Signed)

## 2023-04-09 ENCOUNTER — Ambulatory Visit
Admission: RE | Admit: 2023-04-09 | Discharge: 2023-04-09 | Disposition: A | Discharge status: Home / Self Care | Payer: BC Managed Care – PPO | Source: Ambulatory Visit | Attending: Sports Medicine | Admitting: Sports Medicine

## 2023-04-09 DIAGNOSIS — M47817 Spondylosis without myelopathy or radiculopathy, lumbosacral region: Secondary | ICD-10-CM | POA: Diagnosis not present

## 2023-04-09 DIAGNOSIS — M545 Low back pain, unspecified: Secondary | ICD-10-CM

## 2023-04-09 MED ORDER — METHYLPREDNISOLONE ACETATE 40 MG/ML INJ SUSP (RADIOLOG
80.0000 mg | Freq: Once | INTRAMUSCULAR | Status: AC
  Administered 2023-04-09: 80 mg via EPIDURAL

## 2023-04-09 MED ORDER — IOPAMIDOL (ISOVUE-M 200) INJECTION 41%
1.0000 mL | Freq: Once | INTRAMUSCULAR | Status: AC
  Administered 2023-04-09: 1 mL via EPIDURAL

## 2023-04-16 DIAGNOSIS — G4733 Obstructive sleep apnea (adult) (pediatric): Secondary | ICD-10-CM | POA: Diagnosis not present

## 2023-05-03 ENCOUNTER — Other Ambulatory Visit: Payer: Self-pay | Admitting: Cardiovascular Disease

## 2023-05-17 DIAGNOSIS — G4733 Obstructive sleep apnea (adult) (pediatric): Secondary | ICD-10-CM | POA: Diagnosis not present

## 2023-05-23 DIAGNOSIS — N200 Calculus of kidney: Secondary | ICD-10-CM | POA: Diagnosis not present

## 2023-05-23 DIAGNOSIS — R3912 Poor urinary stream: Secondary | ICD-10-CM | POA: Diagnosis not present

## 2023-05-23 DIAGNOSIS — N5201 Erectile dysfunction due to arterial insufficiency: Secondary | ICD-10-CM | POA: Diagnosis not present

## 2023-05-23 DIAGNOSIS — N401 Enlarged prostate with lower urinary tract symptoms: Secondary | ICD-10-CM | POA: Diagnosis not present

## 2023-05-28 DIAGNOSIS — E063 Autoimmune thyroiditis: Secondary | ICD-10-CM | POA: Diagnosis not present

## 2023-05-28 DIAGNOSIS — R7303 Prediabetes: Secondary | ICD-10-CM | POA: Diagnosis not present

## 2023-05-28 DIAGNOSIS — E039 Hypothyroidism, unspecified: Secondary | ICD-10-CM | POA: Diagnosis not present

## 2023-05-28 DIAGNOSIS — G4733 Obstructive sleep apnea (adult) (pediatric): Secondary | ICD-10-CM | POA: Diagnosis not present

## 2023-06-04 DIAGNOSIS — L821 Other seborrheic keratosis: Secondary | ICD-10-CM | POA: Diagnosis not present

## 2023-06-04 DIAGNOSIS — L578 Other skin changes due to chronic exposure to nonionizing radiation: Secondary | ICD-10-CM | POA: Diagnosis not present

## 2023-06-04 DIAGNOSIS — D225 Melanocytic nevi of trunk: Secondary | ICD-10-CM | POA: Diagnosis not present

## 2023-06-04 DIAGNOSIS — L814 Other melanin hyperpigmentation: Secondary | ICD-10-CM | POA: Diagnosis not present

## 2023-06-04 DIAGNOSIS — L570 Actinic keratosis: Secondary | ICD-10-CM | POA: Diagnosis not present

## 2023-06-06 ENCOUNTER — Ambulatory Visit (HOSPITAL_BASED_OUTPATIENT_CLINIC_OR_DEPARTMENT_OTHER)
Admission: RE | Admit: 2023-06-06 | Discharge: 2023-06-06 | Disposition: A | Discharge status: Home / Self Care | Source: Ambulatory Visit | Attending: Sports Medicine | Admitting: Sports Medicine

## 2023-06-06 ENCOUNTER — Encounter: Payer: Self-pay | Admitting: Sports Medicine

## 2023-06-06 ENCOUNTER — Ambulatory Visit (INDEPENDENT_AMBULATORY_CARE_PROVIDER_SITE_OTHER): Admitting: Sports Medicine

## 2023-06-06 VITALS — BP 110/70 | Ht 72.0 in | Wt 236.0 lb

## 2023-06-06 DIAGNOSIS — M549 Dorsalgia, unspecified: Secondary | ICD-10-CM | POA: Diagnosis not present

## 2023-06-06 DIAGNOSIS — M47814 Spondylosis without myelopathy or radiculopathy, thoracic region: Secondary | ICD-10-CM | POA: Diagnosis not present

## 2023-06-06 DIAGNOSIS — M546 Pain in thoracic spine: Secondary | ICD-10-CM

## 2023-06-06 DIAGNOSIS — G8929 Other chronic pain: Secondary | ICD-10-CM | POA: Insufficient documentation

## 2023-06-06 MED ORDER — PREDNISONE 10 MG PO TABS
ORAL_TABLET | ORAL | 0 refills | Status: DC
Start: 2023-06-06 — End: 2023-10-15

## 2023-06-06 NOTE — Progress Notes (Signed)
   Subjective:    Patient ID: Joseph Fuentes, male    DOB: August 14, 1955, 68 y.o.   MRN: [id number]  HPI chief complaint mid back pain  Joseph Fuentes presents today with approximately 4 months of mid back pain.  He thinks his pain may have started after a sneeze.  He describes an ongoing discomfort in his mid back which is worse after activity.  He is an avid golfer and notes that his symptoms are minimal while golfing but afterwards he is quite uncomfortable.  Hydrocodone  has not helped.  Biofreeze and heat have been helpful.  He has a history of low back pain and has received epidural steroid injections in the past but this pain is different.  Past medical history reviewed Medications reviewed Allergies reviewed  Review of Systems As above    Objective:   Physical Exam  Well-developed, well-nourished.  No acute distress.  Examination of the thoracic spine does show some tenderness to palpation along the mid thoracic area.  No spasm.  X-rays of the thoracic spine including AP and lateral views show moderate degenerative changes at multiple levels    Assessment & Plan:   Mid back pain secondary to thoracic spine degenerative changes  Joseph Fuentes has had symptoms for several weeks.  He may benefit from spinal injections.  We will order an MRI for procedural planning and I will refer him to Dr. Ibazebo at [facility].  We will trial a 6-day Sterapred Dosepak as well.  He will continue with his Biofreeze and heat as needed.  Follow-up with me as needed.  This note was dictated using Dragon naturally speaking software and may contain errors in syntax, spelling, or content which have not been identified prior to signing this note.

## 2023-06-07 ENCOUNTER — Encounter: Payer: Self-pay | Admitting: Sports Medicine

## 2023-06-14 ENCOUNTER — Telehealth (HOSPITAL_BASED_OUTPATIENT_CLINIC_OR_DEPARTMENT_OTHER): Payer: Self-pay

## 2023-06-16 DIAGNOSIS — G4733 Obstructive sleep apnea (adult) (pediatric): Secondary | ICD-10-CM | POA: Diagnosis not present

## 2023-06-26 ENCOUNTER — Telehealth: Payer: Self-pay | Admitting: Neurology

## 2023-06-26 DIAGNOSIS — G43709 Chronic migraine without aura, not intractable, without status migrainosus: Secondary | ICD-10-CM

## 2023-06-26 NOTE — Progress Notes (Unsigned)
 Patient: Joseph Fuentes Date of Birth: 04-09-1955  Reason for Visit: Follow up History from: Patient, wife  Primary Neurologist: Joseph Fuentes   Virtual Visit via Video Note  I connected with Joseph Fuentes on 06/27/23 at  2:30 PM EDT by a video enabled telemedicine application and verified that I am speaking with the correct person using two identifiers.  Location: Patient: at his home Provider: in the office    I discussed the limitations of evaluation and management by telemedicine and the availability of in person appointments. The patient expressed understanding and agreed to proceed.  ASSESSMENT AND PLAN 68 y.o. year old male   Chronic migraine headache  -Migraines are under good control, about 3 a month -Continue Relpax  40 mg as needed for acute migraine headache, will send in 43-month supply -Continue Topamax  200 mg daily for migraine prevention - No longer on Ajovy  was expensive  - Next steps: Consider Botox, Qulipta  Reported memory change for several years, no change with Topamax  reduction or elimination Anxiety  -MMSE previously 30/30.  He actually feels his memory is better since retiring.  He did not pursue neuropsychological evaluation, he is going to hold off for now.  MRI of the brain was unremarkable. ATN profile was normal. E3/E4 - Previously tried to reduce Topamax , but had worsening in headaches - 6 month follow up with Dr. Tresia Fuentes, his preference  Meds ordered this encounter  Medications   eletriptan  (RELPAX ) 40 MG tablet    Sig: Take 1 tablet (40 mg total) by mouth as needed for migraine or headache. May repeat in 2 hours if headache persists or recurs.    Dispense:  42 tablet    Refill:  3    This is a 3 month supply   HISTORY OF PRESENT ILLNESS: Today 06/26/23 Update 06/27/23 SS: Via VV. He is now retired. He will be going on Medicare. Wanted to get a refill on Relpax , somehow Imitrex was sent in, but he has never taken. Migraines doing great. On average about 3  migraines a month. Relpax  works great for him. Remains on Topamax  200 mg daily, tried to come off for memory but had worsening migraines. No longer on Ajovy .  Memory is actually doing better since he retired.  Had MRI of the brain in May 2024 that was unremarkable, ATN profile was normal, E3/E4. He tried to call for neuropsych testing but couldn't get through.   06/05/22 Dr. Tresia Fuentes: Patient is here today and still complaining of memory changes. He insists there is something wrong. He cannot multitask, he has short-term memory loss, he has difficulty remembering people's names, he has been complaining of this for the last year, he saw my nurse practitioner in January of this year and she recommended testing which he could not complete. At this time given he is continuing to complain of memory changes and cognitive decline I will order again of the neuropsych testing, MRI of the brain, will also add in several lab labs.   02/07/22 SS: Here today to discuss worsening headaches. Review of my chart message 12/27/21 decreased Topamax   down to 100 mg daily due to memory concerns, no worsening in headaches.01/02/22 stopped Topamax  completely almost immediate return of headache. 01/31/22 urgent care for sinusitis, given cephalexin , 12/26 he took z-pack and prednisone . Has virtually had a migraine daily since. In rebound headache, taking daily Relpax . 4 days ago he stopped the Relpax , instead lay down in dark room with ice pack. Yesterday he had no headache. Still  on Ajovy , taking Topamax  200 mg in AM (has always done in AM). Has been on Topamax  for 15 years. Has tried Nurtec didn't help at all, took Ubrelvy  once it may have helped, he has samples of both at home.   Also, wants to discuss memory troubles which is why he tried to initially come off the Topamax  to start. Trouble staying focused. He is a Engineer, civil (consulting). His mind need to stay on task, has done for 40 years, last few years more trouble focusing, in other  aspects of his life. Lose train of thought, driving forget where he is going, can cause panic attack. Gathers thoughts comes back. Pays bills, lets them sit long, because he avoids, is a bigger job then. Wife feels more forgetful, she thinks related to age. Is under a lot of work related stress. His wife lost her job in the summer, just got a new one. He is having to learn a new task at work. Planning to retire in 2 years. Wears CPAP nightly. Lately sleeping well due to taking cold medication, otherwise has chronic pain that is under good control after recent injections. He noticed no change in the memory after reducing/stopping the Topamax .   HISTORY  06/22/2021: could not connect via video, we spoke on the phone briefly, Joseph Fuentes < 5 minutes refill meds see in one year.   05/12/2020: He was miserable with the headaches. He started ajovy  and stopped chocolate and he fels better. The nurtec helps a little. The relpax  has been his saviour, however difficult to get insurance approval and a little pricey but works Insurance account manager. We discussed other options for acute management, we can try Ubrelvy    Patient complains of symptoms per HPI as well as the following symptoms: headache much improved, sleep apnea compliant on cpap. Pertinent negatives and positives per HPI. All others negative     Joseph Fuentes: 02/03/20  Joseph Fuentes is a very pleasant 68 year old male with long history of migraine headaches.  Previously, on Topamax  200 mg daily, he reported memory loss.  In the past, he started Emgality , for 3 months, weaned dose of Topamax , Emgality  was not helpful, ultimately Topamax  was increased back to 200 mg twice a day.  He was going to try Aimovig , never did.  For quite a while, his headaches have been fairly well controlled.  However, in the past 2 to 3 months, has had increase in frequency of headache, almost daily.  He got into a cycle of rebound headache, taking frequent Relpax , ibuprofen , Excedrin.  Migraines, having  typical presentation, starts at the right cheek, radiates to the right temple, can be associated with right eye drooping, with migraine features.  The only change, is the last several months, he may have 2-3 migraines in 1 day.  As he has started to break away from the rebound cycle, headaches have improved.  He has not had a migraine in the last 2 days.  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 Currently remains on Topamax  200 mg daily.  Takes hydrocodone  for chronic back pain.  Presents today for evaluation accompanied by his wife. Headaches have been so bad, he had to walk off the golf course. Not interested in Botox.     REVIEW OF SYSTEMS: Out of a complete 14 system review of symptoms, the patient complains only of the following symptoms, and all other reviewed systems are negative.  See HPI  ALLERGIES: Allergies  Allergen Reactions  Gabapentin     Oxycodone Itching, Nausea And Vomiting and Nausea Only   Penicillins     Reaction occurred as an infant; parents told him never to take PCN   Terbinafine And Related Rash    All over body    HOME MEDICATIONS: Outpatient Medications Prior to Visit  Medication Sig Dispense Refill   ALLEGRA-D ALLERGY & CONGESTION 180-240 MG 24 hr tablet Take 1 tablet by mouth daily.   3   aspirin-acetaminophen -caffeine (EXCEDRIN MIGRAINE) 250-250-65 MG tablet Take by mouth every 6 (six) hours as needed for headache.     colesevelam  (WELCHOL ) 625 MG tablet Take 1 tablet (625 mg total) by mouth 2 (two) times daily with a meal. 540 tablet 3   colesevelam  (WELCHOL ) 625 MG tablet TAKE 3 TABLETS (1875MG      TOTAL) 3 TIMES A DAY. 540 tablet 3   desoximetasone (TOPICORT) 0.25 % cream Apply 1 application topically daily as needed.     DEXILANT 60 MG capsule Take 60 mg by mouth every other day.     diazepam  (VALIUM ) 5 MG tablet Take one pill one hour before your MRI You must have someone drive you to the MRI appt  (Patient not taking: Reported on 09/01/2022) 2 tablet 0   docusate sodium (COLACE) 100 MG capsule Take 100 mg by mouth daily.      ezetimibe  (ZETIA ) 10 MG tablet TAKE 1 TABLET DAILY 90 tablet 2   fluticasone  (CUTIVATE ) 0.005 % ointment Apply 1 application topically 2 (two) times daily.     fluticasone  (FLONASE ) 50 MCG/ACT nasal spray Place 2 sprays into both nostrils daily. 48 g 3   HYDROcodone -acetaminophen  (NORCO) 10-325 MG tablet Take 1 tablet by mouth 2 (two) times daily as needed (do not drive for 8 hours after taking). 60 tablet 0   ibuprofen  (ADVIL ) 800 MG tablet Take 1 tablet (800 mg total) by mouth 2 (two) times daily as needed. 180 tablet 3   ipratropium (ATROVENT ) 0.06 % nasal spray 1 spray in each nostril 2 times daily 45 mL 3   NAFTIN 2 % CREA as needed.     omeprazole (PRILOSEC) 40 MG capsule Take 1-2 tablets by mouth at bedtime.      POTASSIUM PO Take 99 mg by mouth.      predniSONE  (DELTASONE ) 10 MG tablet Take by mouth as directed. 21 tablet 0   SYNTHROID 112 MCG tablet Take 112 mcg by mouth daily before breakfast.     tamsulosin (FLOMAX) 0.4 MG CAPS capsule 1 capsule 30 minutes after the same meal each day     topiramate  (TOPAMAX ) 200 MG tablet TAKE 1 TABLET DAILY 90 tablet 3   traZODone  (DESYREL ) 50 MG tablet TAKE 1/2 TO 1 TABLET BY MOUTH AT BEDTIME AS NEEDED FOR SLEEP 90 tablet 2   triamcinolone  ointment (KENALOG) 0.1 % as needed.     No facility-administered medications prior to visit.    PAST MEDICAL HISTORY: Past Medical History:  Diagnosis Date   Allergic rhinitis due to dust    Anxiety    BPH (benign prostatic hyperplasia)    Bulging lumbar disc    Concussion    Deviated septum    Dyshidrotic eczema    Gastroesophageal reflux    Hypothyroidism    IBS (irritable bowel syndrome)    Insomnia    Low back pain    Migraines    Nephrolithiasis    Neuropathy    Onychomycosis    OSA (obstructive sleep apnea)  Osteopenia    Peripheral neuropathy    Rheumatic  fever    68 y.o.- no long term issues   Seborrheic keratosis    Shingles     PAST SURGICAL HISTORY: Past Surgical History:  Procedure Laterality Date   arthroscopic knee surgery Left    COLONOSCOPY     REPLACEMENT TOTAL KNEE Left    SEPTOPLASTY     TENNIS ELBOW RELEASE/NIRSCHEL PROCEDURE     VASECTOMY      FAMILY HISTORY: Family History  Problem Relation Age of Onset   High blood pressure Mother    Lung cancer Mother        40   Neuropathy Mother    Gout Mother    Alzheimer's disease Father        severe dementia   High Cholesterol Father    Gout Father    Other Father        back problems   Alcoholism Brother    Migraines Other        strong maternal fam hx     SOCIAL HISTORY: Social History   Socioeconomic History   Marital status: Married    Spouse name: Not on file   Number of children: 2   Years of education: 14   Highest education level: Associate degree: academic program  Occupational History   Occupation: Editor, commissioning  Tobacco Use   Smoking status: Former    Current packs/day: 0.00    Average packs/day: 2.0 packs/day for 30.0 years (60.0 ttl pk-yrs)    Types: Cigarettes    Start date: 06/27/1976    Quit date: 06/28/2006    Years since quitting: 17.0   Smokeless tobacco: Never  Vaping Use   Vaping status: Never Used  Substance and Sexual Activity   Alcohol use: Yes    Alcohol/week: 2.0 standard drinks of alcohol    Types: 2 Cans of beer per week   Drug use: Not Currently   Sexual activity: Yes    Partners: Female  Other Topics Concern   Not on file  Social History Narrative   Lives at home with his wife. 2 children from prior marriage. 6 grandkids.       Retired late 2024   Engineer, civil (consulting) at [employer]   Social Drivers of [employer]: Low Risk  (02/23/2023)   Overall Financial Resource Strain (CARDIA)    Difficulty of Paying Living Expenses: Not very hard  Food Insecurity: No Food Insecurity (02/23/2023)    Hunger Vital Sign    Worried About Running Out of Food in the Last Year: Never true    Ran Out of Food in the Last Year: Never true  Transportation Needs: No Transportation Needs (02/23/2023)   PRAPARE - Administrator, Civil Service (Medical): No    Lack of Transportation (Non-Medical): No  Physical Activity: Sufficiently Active (02/23/2023)   Exercise Vital Sign    Days of Exercise per Week: 2 days    Minutes of Exercise per Session: 150+ min  Stress: No Stress Concern Present (02/23/2023)   [employer] of Occupational Health - Occupational Stress Questionnaire    Feeling of Stress : Only a little  Social Connections: Moderately Integrated (02/23/2023)   Social Connection and Isolation Panel [NHANES]    Frequency of Communication with Friends and Family: Three times a week    Frequency of Social Gatherings with Friends and Family: Once a week    Attends Religious Services: 1 to 4  times per year    Active Member of Clubs or Organizations: No    Attends Banker Meetings: Not on file    Marital Status: Married  Catering manager Violence: Not on file    PHYSICAL EXAM  There were no vitals filed for this visit.  There is no height or weight on file to calculate BMI.    02/07/2022    8:35 AM  MMSE - Mini Mental State Exam  Orientation to time 5  Orientation to Place 5  Registration 3  Attention/ Calculation 5  Recall 3  Language- name 2 objects 2  Language- repeat 1  Language- follow 3 step command 3  Language- read & follow direction 1  Write a sentence 1  Copy design 1  Total score 30   Generalized: Well developed, in no acute distress  Via video visit, is alert and oriented, speech is clear and concise, facial symmetry noted, moves about freely.  DIAGNOSTIC DATA (LABS, IMAGING, TESTING) - I reviewed patient records, labs, notes, testing and imaging myself where available.  Lab Results  Component Value Date   WBC 6.2 06/13/2022   HGB  14.1 06/13/2022   HCT 42.4 06/13/2022   MCV 92 06/13/2022   PLT 219 06/13/2022      Component Value Date/Time   NA 141 06/13/2022 1540   K 3.9 06/13/2022 1540   CL 106 06/13/2022 1540   CO2 22 06/13/2022 1540   GLUCOSE 100 (H) 06/13/2022 1540   BUN 16 06/13/2022 1540   CREATININE 1.34 (H) 06/13/2022 1540   CALCIUM  9.3 06/13/2022 1540   PROT 7.6 06/13/2022 1540   ALBUMIN 4.3 06/13/2022 1540   AST 19 06/13/2022 1540   ALT 20 06/13/2022 1540   ALKPHOS 93 06/13/2022 1540   BILITOT 0.4 06/13/2022 1540   GFRNONAA 72 11/21/2017 1555   GFRAA 83 11/21/2017 1555   No results found for: "CHOL", "HDL", "LDLCALC", "LDLDIRECT", "TRIG", "CHOLHDL" Lab Results  Component Value Date   HGBA1C 5.7 (H) 11/13/2018   Lab Results  Component Value Date   VITAMINB12 440 11/13/2018   Lab Results  Component Value Date   TSH 0.94 06/30/2022    Jeanmarie Millet, AGNP-C, DNP 06/26/2023, 9:27 PM [facility] [address], [address] [place], [place] [address] [phone]

## 2023-06-26 NOTE — Telephone Encounter (Signed)
 I called pt and asked about the medications for his migraines.  There are zomig, sumatriptan on med list. He is taking eletriptan .  He changes to medicare June 1, and is trying to get his medications refilled prior to that kicking in.  [facility] uses [facility].  I donot see that he has picked up sumatriptan or zomig.  I see the last eletriptan  02-28-2023 90 day supply filled. [facility] [address]   I made appt for him mychart VV 15 min tomorrow 06-27-2023 at 1430.  Only for migraine med management.  Last appt was 05-2022.  I relayed if this was an issue that would all him back.

## 2023-06-26 NOTE — Telephone Encounter (Signed)
 Pt is asking for a call to discuss the Discontinued  Rx for Eletriptan  40 mg, he is wanting the 90 day  amount called [id number]

## 2023-06-27 ENCOUNTER — Telehealth: Admitting: Neurology

## 2023-06-27 DIAGNOSIS — R413 Other amnesia: Secondary | ICD-10-CM

## 2023-06-27 DIAGNOSIS — G43709 Chronic migraine without aura, not intractable, without status migrainosus: Secondary | ICD-10-CM | POA: Diagnosis not present

## 2023-06-27 MED ORDER — ELETRIPTAN HYDROBROMIDE 40 MG PO TABS: 40.0000 mg | ORAL_TABLET | ORAL | 3 refills | Status: AC | PRN

## 2023-06-27 NOTE — Patient Instructions (Signed)
 Great to see you today.  I will refill your Relpax .  Continue Topamax  for migraine prevention.  We will see you back in 6 months.  Please reach out if you need anything.  Thanks!!

## 2023-07-05 DIAGNOSIS — H33021 Retinal detachment with multiple breaks, right eye: Secondary | ICD-10-CM | POA: Diagnosis not present

## 2023-07-05 DIAGNOSIS — H35411 Lattice degeneration of retina, right eye: Secondary | ICD-10-CM | POA: Diagnosis not present

## 2023-07-31 DIAGNOSIS — M546 Pain in thoracic spine: Secondary | ICD-10-CM | POA: Diagnosis not present

## 2023-08-08 DIAGNOSIS — H33021 Retinal detachment with multiple breaks, right eye: Secondary | ICD-10-CM | POA: Diagnosis not present

## 2023-08-08 DIAGNOSIS — Z9889 Other specified postprocedural states: Secondary | ICD-10-CM | POA: Diagnosis not present

## 2023-08-16 DIAGNOSIS — M546 Pain in thoracic spine: Secondary | ICD-10-CM | POA: Diagnosis not present

## 2023-09-11 ENCOUNTER — Other Ambulatory Visit: Payer: Self-pay | Admitting: Family Medicine

## 2023-09-11 MED ORDER — HYDROCODONE-ACETAMINOPHEN 10-325 MG PO TABS: 1.0000 | ORAL_TABLET | Freq: Two times a day (BID) | ORAL | 0 refills | Status: DC | PRN

## 2023-09-11 NOTE — Telephone Encounter (Signed)
 Copied from CRM [id number]. Topic: Clinical - Medication Refill >> Sep 11, 2023 12:12 PM Robinson H wrote: Medication: HYDROcodone -acetaminophen  (NORCO) 10-325 MG tablet  Has the patient contacted their pharmacy? No (Agent: If no, request that the patient contact the pharmacy for the refill. If patient does not wish to contact the pharmacy document the reason why and proceed with request.) (Agent: If yes, when and what did the pharmacy advise?)  This is the patient's preferred pharmacy:  [id number] [place], [place] - [address] [place] [address] Phone: [phone] Fax: [phone]    Is this the correct pharmacy for this prescription? Yes If no, delete pharmacy and type the correct one.   Has the prescription been filled recently? No  Is the patient out of the medication? Yes  Has the patient been seen for an appointment in the last year OR does the patient have an upcoming appointment? Yes  Can we respond through MyChart? Yes  Agent: Please be advised that Rx refills may take up to 3 business days. We ask that you follow-up with your pharmacy.

## 2023-09-12 DIAGNOSIS — H33021 Retinal detachment with multiple breaks, right eye: Secondary | ICD-10-CM | POA: Diagnosis not present

## 2023-09-12 DIAGNOSIS — Z9889 Other specified postprocedural states: Secondary | ICD-10-CM | POA: Diagnosis not present

## 2023-09-14 ENCOUNTER — Telehealth: Payer: Self-pay | Admitting: Allergy and Immunology

## 2023-09-14 NOTE — Telephone Encounter (Signed)
 Pt called and stated that he is in need of courtesy refills for fluticasone  (FLONASE ) 50 MCG/ACT nasal spray [[id number]]  and for ipratropium (ATROVENT ) 0.06 % nasal spray [[id number]]  He has an OV scheduled for 9/2 but is out of these medications. He states he would like them in a 90 day supply as it charges him the same price for a 30 day supply. Best contact [phone]

## 2023-09-14 NOTE — Telephone Encounter (Signed)
 Patient has been informed via voicemail that since he has not had an OV since 2023 he will need to wait until his appointment for 90 day refills on his nasal sprays. I did inform him that the Flonase  is over the counter.

## 2023-10-02 ENCOUNTER — Other Ambulatory Visit: Payer: Self-pay

## 2023-10-02 ENCOUNTER — Ambulatory Visit: Admitting: Allergy and Immunology

## 2023-10-02 ENCOUNTER — Encounter: Payer: Self-pay | Admitting: Allergy and Immunology

## 2023-10-02 VITALS — BP 128/72 | HR 73 | Temp 98.3°F | Resp 18 | Ht 72.0 in | Wt 231.7 lb

## 2023-10-02 DIAGNOSIS — J3089 Other allergic rhinitis: Secondary | ICD-10-CM | POA: Diagnosis not present

## 2023-10-02 MED ORDER — IPRATROPIUM BROMIDE 0.06 % NA SOLN: NASAL | 3 refills | Status: AC

## 2023-10-02 MED ORDER — FLUTICASONE PROPIONATE 50 MCG/ACT NA SUSP: 2.0000 | Freq: Every day | NASAL | 3 refills | Status: AC

## 2023-10-02 NOTE — Patient Instructions (Signed)
  1. Continue Flonase  and nasal ipratropium (3 month + 3 refills)  2. Return to clinic in 12 months or earlier if problem  3. Influenza = Tamiflu. Covid = Paxlovid  4. Return to clinic in 1 year or earlier if problem

## 2023-10-02 NOTE — Progress Notes (Unsigned)
 [place] [facility] [place] [facility] [place] - [place] - [place]   Follow-up Note  Referring Provider: Katrinka Garnette KIDD, MD Primary Provider: Katrinka Garnette KIDD, MD Date of Office Visit: 10/02/2023  Subjective:   Joseph Fuentes (DOB: 1955/11/03) is a 68 y.o. male who [facility] in re-evaluation of the following:  HPI: Bud returns to this clinic in evaluation of allergic rhinoconjunctivitis.  I last saw him in this clinic 05 April 2021.  He has really done very well while utilizing a combination of Flonase  and nasal ipratropium and he has no significant nasal symptoms and can go through the entire year with no difficulty at all and is very pleased with the response that he has received while utilizing this plan.  Allergies as of 10/02/2023       Reactions   Gabapentin     Oxycodone Itching, Nausea And Vomiting, Nausea Only   Penicillins    Reaction occurred as an infant; parents told him never to take PCN   Terbinafine And Related Rash   All over body        Medication List    Allegra-D Allergy & Congestion 180-240 MG 24 hr tablet Generic drug: fexofenadine-pseudoephedrine Take 1 tablet by mouth daily.   aspirin-acetaminophen -caffeine 250-250-65 MG tablet Commonly known as: EXCEDRIN MIGRAINE Take by mouth every 6 (six) hours as needed for headache.   colesevelam  625 MG tablet Commonly known as: WELCHOL  Take 1 tablet (625 mg total) by mouth 2 (two) times daily with a meal.   Welchol  625 MG tablet Generic drug: colesevelam  TAKE 3 TABLETS (1875MG      TOTAL) 3 TIMES A DAY.   desoximetasone 0.25 % cream Commonly known as: TOPICORT Apply 1 application topically daily as needed.   Dexilant 60 MG capsule Generic drug: dexlansoprazole Take 60 mg by mouth every other day.   diazepam  5 MG tablet Commonly known as: VALIUM  Take one pill one hour before your MRI You must have someone drive you to the MRI appt   docusate sodium 100  MG capsule Commonly known as: COLACE Take 100 mg by mouth daily.   eletriptan  40 MG tablet Commonly known as: Relpax  Take 1 tablet (40 mg total) by mouth as needed for migraine or headache. May repeat in 2 hours if headache persists or recurs.   ezetimibe  10 MG tablet Commonly known as: ZETIA  TAKE 1 TABLET DAILY   fluticasone  0.005 % ointment Commonly known as: CUTIVATE  Apply 1 application topically 2 (two) times daily.   fluticasone  50 MCG/ACT nasal spray Commonly known as: FLONASE  Place 2 sprays into both nostrils daily.   HYDROcodone -acetaminophen  10-325 MG tablet Commonly known as: NORCO Take 1 tablet by mouth 2 (two) times daily as needed (do not drive for 8 hours after taking. keep upcoming september visit).   ibuprofen  800 MG tablet Commonly known as: ADVIL  Take 1 tablet (800 mg total) by mouth 2 (two) times daily as needed.   ipratropium 0.06 % nasal spray Commonly known as: ATROVENT  1 spray in each nostril 2 times daily   Naftin 2 % Crea Generic drug: Naftifine HCl as needed.   omeprazole 40 MG capsule Commonly known as: PRILOSEC Take 1-2 tablets by mouth at bedtime.   POTASSIUM PO Take 99 mg by mouth.   predniSONE  10 MG tablet Commonly known as: DELTASONE  Take by mouth as directed.   Synthroid 112 MCG tablet Generic drug: levothyroxine Take 112 mcg by mouth daily before breakfast.   tamsulosin  0.4 MG Caps capsule Commonly known as: FLOMAX 1 capsule 30 minutes after the same meal each day   topiramate  200 MG tablet Commonly known as: TOPAMAX  TAKE 1 TABLET DAILY   traZODone  50 MG tablet Commonly known as: DESYREL  TAKE 1/2 TO 1 TABLET BY MOUTH AT BEDTIME AS NEEDED FOR SLEEP   triamcinolone  ointment 0.1 % Commonly known as: KENALOG as needed.    Past Medical History:  Diagnosis Date   Allergic rhinitis due to dust    Anxiety    BPH (benign prostatic hyperplasia)    Bulging lumbar disc    Concussion    Deviated septum    Dyshidrotic  eczema    Gastroesophageal reflux    Hypothyroidism    IBS (irritable bowel syndrome)    Insomnia    Low back pain    Migraines    Nephrolithiasis    Neuropathy    Onychomycosis    OSA (obstructive sleep apnea)    Osteopenia    Peripheral neuropathy    Rheumatic fever    68 y.o.- no long term issues   Seborrheic keratosis    Shingles     Past Surgical History:  Procedure Laterality Date   arthroscopic knee surgery Left    COLONOSCOPY     REPLACEMENT TOTAL KNEE Left    SEPTOPLASTY     TENNIS ELBOW RELEASE/NIRSCHEL PROCEDURE     VASECTOMY      Review of systems negative except as noted in HPI / PMHx or noted below:  Review of Systems  Constitutional: Negative.   HENT: Negative.    Eyes: Negative.   Respiratory: Negative.    Cardiovascular: Negative.   Gastrointestinal: Negative.   Genitourinary: Negative.   Musculoskeletal: Negative.   Skin: Negative.   Neurological: Negative.   Endo/Heme/Allergies: Negative.   Psychiatric/Behavioral: Negative.       Objective:   Vitals:   10/02/23 1109  BP: 128/72  Pulse: 73  Resp: 18  Temp: 98.3 F (36.8 C)  SpO2: 99%   Height: 6' (182.9 cm)  Weight: 231 lb 11.2 oz (105.1 kg)   Physical Exam Constitutional:      Appearance: He is not diaphoretic.  HENT:     Head: Normocephalic.     Right Ear: Tympanic membrane, ear canal and external ear normal.     Left Ear: Tympanic membrane, ear canal and external ear normal.     Nose: Nose normal. No mucosal edema or rhinorrhea.     Mouth/Throat:     Pharynx: Uvula midline. No oropharyngeal exudate.  Eyes:     Conjunctiva/sclera: Conjunctivae normal.  Neck:     Thyroid : No thyromegaly.     Trachea: Trachea normal. No tracheal tenderness or tracheal deviation.  Cardiovascular:     Rate and Rhythm: Normal rate and regular rhythm.     Heart sounds: Normal heart sounds, S1 normal and S2 normal. No murmur heard. Pulmonary:     Effort: No respiratory distress.     Breath  sounds: Normal breath sounds. No stridor. No wheezing or rales.  Lymphadenopathy:     Head:     Right side of head: No tonsillar adenopathy.     Left side of head: No tonsillar adenopathy.     Cervical: No cervical adenopathy.  Skin:    Findings: No erythema or rash.     Nails: There is no clubbing.  Neurological:     Mental Status: He is alert.     Diagnostics: none   Assessment  and Plan:   1. Other allergic rhinitis     1. Continue Flonase  and nasal ipratropium (3 month + 3 refills)  2. Return to clinic in 12 months or earlier if problem  3. Influenza = Tamiflu. Covid = Paxlovid  4. Return to clinic in 1 year or earlier if problem  Ion is doing very well and we refilled his medications and he can return to this clinic in 1 year or earlier if there is a problem.  Camellia Denis, MD Allergy / Immunology [facility]

## 2023-10-03 ENCOUNTER — Encounter: Payer: Self-pay | Admitting: Allergy and Immunology

## 2023-10-09 ENCOUNTER — Encounter: Payer: Self-pay | Admitting: Cardiovascular Disease

## 2023-10-09 ENCOUNTER — Other Ambulatory Visit: Payer: Self-pay

## 2023-10-09 MED ORDER — EZETIMIBE 10 MG PO TABS: 10.0000 mg | ORAL_TABLET | Freq: Every day | ORAL | 1 refills | Status: AC

## 2023-10-15 ENCOUNTER — Ambulatory Visit: Admitting: Family Medicine

## 2023-10-15 VITALS — BP 132/66 | HR 67 | Temp 98.1°F | Ht 72.0 in | Wt 227.0 lb

## 2023-10-15 DIAGNOSIS — E785 Hyperlipidemia, unspecified: Secondary | ICD-10-CM

## 2023-10-15 DIAGNOSIS — G8929 Other chronic pain: Secondary | ICD-10-CM | POA: Diagnosis not present

## 2023-10-15 DIAGNOSIS — Z23 Encounter for immunization: Secondary | ICD-10-CM

## 2023-10-15 DIAGNOSIS — Z79899 Other long term (current) drug therapy: Secondary | ICD-10-CM | POA: Diagnosis not present

## 2023-10-15 DIAGNOSIS — M546 Pain in thoracic spine: Secondary | ICD-10-CM | POA: Diagnosis not present

## 2023-10-15 DIAGNOSIS — R7303 Prediabetes: Secondary | ICD-10-CM

## 2023-10-15 DIAGNOSIS — Z131 Encounter for screening for diabetes mellitus: Secondary | ICD-10-CM

## 2023-10-15 DIAGNOSIS — Z0000 Encounter for general adult medical examination without abnormal findings: Secondary | ICD-10-CM | POA: Diagnosis not present

## 2023-10-15 MED ORDER — HYDROCODONE-ACETAMINOPHEN 10-325 MG PO TABS: 1.0000 | ORAL_TABLET | Freq: Two times a day (BID) | ORAL | 0 refills | Status: DC | PRN

## 2023-10-15 NOTE — Patient Instructions (Addendum)
 We have placed a referral for you today to [facility] Dr. Claudene or Dr. Joane - please call their # if you do not hear within a week (may be listed below or you may see mychart message within a few days with #).   Schedule a lab visit at the check out desk within 2 weeks. Return for future fasting labs meaning nothing but water after midnight please. Ok to take your medications with water.   Thanks for doing flu shot  Mr. Joseph Fuentes , Thank you for taking time to come for your Medicare Wellness Visit. I appreciate your ongoing commitment to your health goals. Please review the following plan we discussed and let me know if I can assist you in the future.   These are the goals we discussed: Reduce unhealthy food choices and reduce frequency of eating late When back tolerates it continue 150 minutes a week of eercise asked him to mention the new findings nonobstructive CAD to neurology in relation to relpax   This is a list of the screening recommended for you and due dates:  Health Maintenance  Topic Date Due   COVID-19 Vaccine (8 - Pfizer risk 2024-25 season) 11/22/2023   Medicare Annual Wellness Visit  10/14/2024   Colon Cancer Screening  11/22/2026   DTaP/Tdap/Td vaccine (4 - Td or Tdap) 04/30/2032   Pneumococcal Vaccine for age over 46  Completed   Flu Shot  Completed   Zoster (Shingles) Vaccine  Completed   HPV Vaccine  Aged Out   Meningitis B Vaccine  Aged Out   Hepatitis C Screening  Discontinued     Recommended follow up: Return in about 4 months (around 02/14/2024) for followup or sooner if needed.Schedule b4 you leave. Or a little later if medicine spacing out better

## 2023-10-15 NOTE — Progress Notes (Signed)
 Phone: [phone]   Subjective:  Patient presents today for their Welcome to Medicare Exam    Preventive Screening-Counseling & Management  Vision screen:  Vision Screening   Right eye Left eye Both eyes  Without correction     With correction 20/25 20/20 20/20   -cataracts removed, detached retina- working through all that with optho  Advanced directives: Full Code, wife HCPOA- has documents- can bring for scan   Modifiable Risk Factors/behavioral risk assessment/psychosocial risk assessment Regular exercise: in general golfing regularly- rides cart Diet: feels he can improve his diet- portions are not bad- but feels he can eat healthier foods- less processed and more fruits and vegetables- also feels eats too late so wants to change . Luckily down 5 lbs Wt Readings from Last 3 Encounters:  10/02/23 231 lb 11.2 oz (105.1 kg)  06/06/23 236 lb (107 kg)  03/29/23 236 lb (107 kg)   Smoking Status: former Smoker but quit over 15 years ago Second Hand Smoking status: No smokers in home Alcohol intake: 2 beers a year Other substance abuse/illicit drugs: none  Cardiac risk factors:  advanced age (older than 54 for men, 68 for women)  treated Hyperlipidemia no Hypertension  No diabetes. But does have prediabetes  Lab Results  Component Value Date   HGBA1C 5.7 (H) 11/13/2018  Family History: CABG x5 at 65 for dad   Depression Screen/risk evaluation Risk factors: chronic pain. PHQ9 of 1 only thankfully    10/15/2023    2:08 PM 09/01/2022    4:24 PM 07/28/2022    2:21 PM 06/16/2022    3:33 PM 05/01/2022    4:13 PM  Depression screen PHQ 2/9  Decreased Interest 0 0 0 0 0  Down, Depressed, Hopeless 0 0 0 0 0  PHQ - 2 Score 0 0 0 0 0  Altered sleeping 0 0 0 0 2  Tired, decreased energy 1 0 0 0 1  Change in appetite 0 0 0 0 0  Feeling bad or failure about yourself  0 0 0 0 0  Trouble concentrating 0 0 0 0 0  Moving slowly or fidgety/restless 0 0 0 0 0  Suicidal thoughts 0 0 0  0 0  PHQ-9 Score 1 0 0 0 3  Difficult doing work/chores Not difficult at all Not difficult at all Not difficult at all Not difficult at all Not difficult at all    Functional ability and level of safety Mobility assessment:  timed get up and go <12 seconds Activities of Daily Living- Independent in ADLs (toileting, bathing, dressing, transferring, eating) and in IADLs (shopping, housekeeping, managing own medications, and handling finances) Home Safety: Loose rugs (none), smoke detectors (up to date ), small pets (cats but has not had falls), grab bars (none but no issues), stairs (none), life-alert system (phone use) Hearing Difficulties:  patient declines Fall Risk: None     10/15/2023   12:00 AM 09/01/2022    4:24 PM 07/28/2022    2:20 PM 07/28/2022    2:12 PM 06/16/2022    3:33 PM  Fall Risk   Falls in the past year? 0 0 0 0 0  Number falls in past yr:  0 0 0 0  Injury with Fall?  0 0 0 0  Risk for fall due to :  No Fall Risks No Fall Risks No Fall Risks No Fall Risks  Follow up  Falls evaluation completed Falls evaluation completed Falls evaluation completed Falls evaluation completed   Opioid use  history: long term opioids use due to chronic pain but reasonably well managed Self assessment of health status: good  Required Immunizations needed today:  flu shot today, has not had COVID- plans on shot 6 months hout Immunization History  Administered Date(s) Administered    sv, Bivalent, Protein Subunit Rsvpref,pf (Abrysvo) 05/23/2023   Fluzone Influenza virus vaccine,trivalent (IIV3), split virus 01/13/2014, 10/31/2015, 11/15/2018, 12/11/2020, 10/27/2021   INFLUENZA, HIGH DOSE SEASONAL PF 11/08/2022, 10/15/2023   Influenza,inj,Quad PF,6+ Mos 10/05/2017, 11/15/2018   Influenza-Unspecified 11/13/2021   PFIZER Comirnaty(Gray Top)Covid-19 Tri-Sucrose Vaccine 10/27/2021   PFIZER(Purple Top)SARS-COV-2 Vaccination 04/10/2019, 05/07/2019, 11/06/2019, 06/16/2020, 12/11/2020, 10/27/2021    PNEUMOCOCCAL CONJUGATE-20 09/01/2022   Pfizer Covid-19 Vaccine Bivalent Booster 110yrs & up 12/11/2020   Pfizer(Comirnaty)Fall Seasonal Vaccine 12 years and older 05/23/2023   Td (Adult) 02/21/2016   Tdap 05/29/2005, 05/01/2022   Zoster Recombinant(Shingrix) 12/12/2017, 03/01/2018   Zoster, Live 12/12/2017, 03/01/2018   Health Maintenance  Topic Date Due   Medicare Annual Wellness Visit  Never done   COVID-19 Vaccine (8 - Pfizer risk 2024-25 season) 11/22/2023   Colon Cancer Screening  11/22/2026   DTaP/Tdap/Td vaccine (4 - Td or Tdap) 04/30/2032   Pneumococcal Vaccine for age over 58  Completed   Flu Shot  Completed   Zoster (Shingles) Vaccine  Completed   HPV Vaccine  Aged Out   Meningitis B Vaccine  Aged Out   Hepatitis C Screening  Discontinued    Screening tests-  Colon cancer screening- 11/21/2016 with 10-year repeat as polyp was hyperplastic only Lung Cancer screening- quit 2008 no longer qualifies for lung cancer screening Skin cancer screening- sees dermatology yearly Prostate cancer screening-follows with Dr. Ailene office-now sees Dr. Selma.  Also get urinalysis there. Asked him to have them send us  next note   The following were reviewed and entered/updated in epic: Past Medical History:  Diagnosis Date   Allergic rhinitis due to dust    Allergy 2005   Anxiety    Arthritis 2015   BPH (benign prostatic hyperplasia)    Bulging lumbar disc    Cataract 2024   Has them removed   Concussion    Deviated septum    Dyshidrotic eczema    Gastroesophageal reflux    Hyperlipidemia 2021   Was found on a scan for something else   Hypothyroidism    IBS (irritable bowel syndrome)    Insomnia    Low back pain    Migraines    Nephrolithiasis    Neuropathy    Onychomycosis    OSA (obstructive sleep apnea)    Osteopenia    Peripheral neuropathy    Rheumatic fever    68 y.o.- no long term issues   Seborrheic keratosis    Shingles    Sleep apnea 1995   Use CPAP    Patient Active Problem List   Diagnosis Date Noted   Chronic back pain on long term narcotics 02/17/2022    Priority: High   Drug-induced myopathy 02/17/2022    Priority: Medium    Hypothyroidism 02/17/2022    Priority: Medium    BPH associated with nocturia 02/17/2022    Priority: Medium    OSA on CPAP 02/17/2022    Priority: Medium    Memory change 02/07/2022    Priority: Medium    Chronic migraine without aura without status migrainosus, not intractable 05/12/2020    Priority: Medium    Hyperlipidemia, unspecified 04/20/2009    Priority: Medium    Anxiety state 04/20/2009  Priority: Medium    GERD 03/10/2009    Priority: Medium    Irritable bowel syndrome 03/10/2009    Priority: Medium    Other allergic rhinitis 04/24/2019    Priority: Low   Polyneuropathy 11/13/2018    Priority: Low   Gastroparesis 04/07/2009    Priority: Low   Pain in joint, shoulder region 08/19/2014    Priority: 1.   Left knee pain 04/30/2013    Priority: 1.   Plantar fasciitis 03/06/2013    Priority: 1.   Past Surgical History:  Procedure Laterality Date   arthroscopic knee surgery Left    COLONOSCOPY     EYE SURGERY  2025   Detached retina- right eye   JOINT REPLACEMENT  2022   Full knee replacement left knee   REPLACEMENT TOTAL KNEE Left    SEPTOPLASTY     TENNIS ELBOW RELEASE/NIRSCHEL PROCEDURE     VASECTOMY      Family History  Problem Relation Age of Onset   High blood pressure Mother    Lung cancer Mother        35   Neuropathy Mother    Gout Mother    Arrhythmia Mother    Alzheimer's disease Father        severe dementia   High Cholesterol Father    Gout Father    Other Father        back problems   Arrhythmia Father    Alcoholism Brother    Migraines Other        strong maternal fam hx     Medications- reviewed and updated Current Outpatient Medications  Medication Sig Dispense Refill   ALLEGRA-D ALLERGY & CONGESTION 180-240 MG 24 hr tablet Take 1  tablet by mouth daily.   3   aspirin-acetaminophen -caffeine (EXCEDRIN MIGRAINE) 250-250-65 MG tablet Take by mouth every 6 (six) hours as needed for headache.     colesevelam  (WELCHOL ) 625 MG tablet Take 1 tablet (625 mg total) by mouth 2 (two) times daily with a meal. 540 tablet 3   desoximetasone (TOPICORT) 0.25 % cream Apply 1 application topically daily as needed.     DEXILANT 60 MG capsule Take 60 mg by mouth every other day.     docusate sodium (COLACE) 100 MG capsule Take 100 mg by mouth daily.      eletriptan  (RELPAX ) 40 MG tablet Take 1 tablet (40 mg total) by mouth as needed for migraine or headache. May repeat in 2 hours if headache persists or recurs. 42 tablet 3   ezetimibe  (ZETIA ) 10 MG tablet Take 1 tablet (10 mg total) by mouth daily. 90 tablet 1   fluticasone  (CUTIVATE ) 0.005 % ointment Apply 1 application topically 2 (two) times daily.     fluticasone  (FLONASE ) 50 MCG/ACT nasal spray Place 2 sprays into both nostrils daily. 48 g 3   HYDROcodone -acetaminophen  (NORCO) 10-325 MG tablet Take 1 tablet by mouth 2 (two) times daily as needed (do not drive for 8 hours after taking. keep upcoming september visit). 20 tablet 0   ibuprofen  (ADVIL ) 800 MG tablet Take 1 tablet (800 mg total) by mouth 2 (two) times daily as needed. 180 tablet 3   ipratropium (ATROVENT ) 0.06 % nasal spray 1 spray in each nostril 2 times daily 45 mL 3   NAFTIN 2 % CREA as needed.     omeprazole (PRILOSEC) 40 MG capsule Take 1-2 tablets by mouth at bedtime.      POTASSIUM PO Take 99 mg by mouth.  SYNTHROID 112 MCG tablet Take 112 mcg by mouth daily before breakfast.     tamsulosin (FLOMAX) 0.4 MG CAPS capsule 1 capsule 30 minutes after the same meal each day     topiramate  (TOPAMAX ) 200 MG tablet TAKE 1 TABLET DAILY 90 tablet 3   traZODone  (DESYREL ) 50 MG tablet TAKE 1/2 TO 1 TABLET BY MOUTH AT BEDTIME AS NEEDED FOR SLEEP 90 tablet 2   triamcinolone  ointment (KENALOG) 0.1 % as needed.     colesevelam   (WELCHOL ) 625 MG tablet TAKE 3 TABLETS (1875MG      TOTAL) 3 TIMES A DAY. 540 tablet 3   diazepam  (VALIUM ) 5 MG tablet Take one pill one hour before your MRI You must have someone drive you to the MRI appt (Patient not taking: Reported on 10/02/2023) 2 tablet 0   predniSONE  (DELTASONE ) 10 MG tablet Take by mouth as directed. 21 tablet 0   No current facility-administered medications for this visit.    Allergies-reviewed and updated Allergies  Allergen Reactions   Gabapentin     Oxycodone Itching, Nausea And Vomiting and Nausea Only   Penicillins     Reaction occurred as an infant; parents told him never to take PCN   Terbinafine And Related Rash    All over body    Social History   Socioeconomic History   Marital status: Married    Spouse name: Not on file   Number of children: 2   Years of education: 14   Highest education level: Associate degree: academic program  Occupational History   Occupation: Editor, commissioning  Tobacco Use   Smoking status: Former    Current packs/day: 0.00    Average packs/day: 2.0 packs/day for 30.0 years (60.0 ttl pk-yrs)    Types: Cigarettes    Start date: 06/27/1976    Quit date: 06/28/2006    Years since quitting: 17.3   Smokeless tobacco: Never  Vaping Use   Vaping status: Never Used  Substance and Sexual Activity   Alcohol use: Not Currently    Alcohol/week: 2.0 standard drinks of alcohol   Drug use: Never   Sexual activity: Yes    Partners: Female    Birth control/protection: None    Comment: Not necessary  Other Topics Concern   Not on file  Social History Narrative   Lives at home with his wife. 2 children from prior marriage. 6 grandkids.       Retired late 2024   Engineer, civil (consulting) at [employer]   Social Drivers of [employer]: Low Risk  (10/15/2023)    Overall Financial Resource Strain (CARDIA)    Difficulty of Paying Living Expenses: Not very hard  Food Insecurity: No Food Insecurity (10/15/2023)    Hunger Vital Sign    Worried About Running Out of Food in the Last Year: Never true    Ran Out of Food in the Last Year: Never true  Transportation Needs: No Transportation Needs (10/15/2023)   PRAPARE - Administrator, Civil Service (Medical): No    Lack of Transportation (Non-Medical): No  Physical Activity: Sufficiently Active (10/15/2023)   Exercise Vital Sign    Days of Exercise per Week: 4 days    Minutes of Exercise per Session: 150+ minimal when able from the back perspective  Stress: No Stress Concern Present (10/15/2023)   [employer] of Occupational Health - Occupational Stress Questionnaire    Feeling of Stress: Only a little  Social Connections: Moderately Isolated (10/15/2023)  Social Connection and Isolation Panel    Frequency of Communication with Friends and Family: Three times a week- great friendships on golf course    Frequency of Social Gatherings with Friends and Family: Once a week    Attends Religious Services: Never    Database administrator or Organizations: No    Attends Engineer, structural: Not on file    Marital Status: Married   Objective  Objective:  BP 132/66 (BP Location: Left Arm, Patient Position: Sitting, Cuff Size: Large)   Pulse 67   Temp 98.1 F (36.7 C) (Temporal)   Ht 6' (1.829 m)   SpO2 97%   BMI 31.42 kg/m  Gen: NAD, resting comfortably HEENT: Mucous membranes are moist. Oropharynx normal Neck: no thyromegaly CV: RRR no murmurs rubs or gallops Lungs: CTAB no crackles, wheeze, rhonchi Abdomen: soft/nontender/nondistended/normal bowel sounds. No rebound or guarding.  Ext: trace edema left > right Skin: warm, dry Neuro: grossly normal, moves all extremities, PERRLA    Assessment and Plan:   Welcome to Medicare exam completed-  Educated, counseled and referred based on above elements Educated, counseled and referred as appropriate for preventative needs Discussed and documented a written plan for  preventiative services and screenings with personalized health advice- After Visit Summary was given to patient which included this plan  4. EKG offered [id number]- patient  declines- done with cardiology  Status of chronic or acute concerns   #Trigeminy thought to be discovered last visit but cardiology notes only has PVCs- referred to cardiology last visit. Thyroid  levels were normal TSH, t3, t4. ZIo patch 3 days-recommended to measure PVC burden is still processing.  He also has CT calcium  scoring 08/23/22 with total 239 at 70%.  Echocardiogram largely reassuring other than some diastolic dysfunction   #Chronic back pain S: medication: hydrocodone  10-325 per primary care doctor Dr. Gib and now filled by us - 60 tablets lasts 90 days - uses prn with activity - about 3 times a week lately - does not use at bedtime usually - Dr. Arvell manages epidural injections about 7-8 months- refer to [facility] for 2nd opinion from [facility] -also with greater trochanteric bursitis issues -has had left knee replacement- has done well  MRI lumbar spine 01/10/2020- mild multilvel spondylosis. No significant spinal canal narrowing, mild multilevel neural foraminal narrowing. Multiple small disc bulges.  -has never seen pain management A/P: ongoing low back pain but  MORE recent thoracic back pain -refer to Dr. Claudene or Dr. Joane for 2nd opinion from [facility]- currently in physical therapy and will continue that. Is at least able to golf lately thankfully and work in yard.   #hyperlipidemia #IBS S: Medication:WelChol  625 mg 3 tablets twice daily (has to space from thyroid  medicine) -didn't tolerate statins due to myalgia, Zetia  10 mg - statin intolerant- myalgias -ended up on welchol  and had essential resolution of IBS  A/P: lipids hopefully improved- still on welchol  for IBS but Zetia  added by cardiology- also on aspirin 81 mg now   #hypothyroidism- with Dr. Faythe due to  oscillatoins S: compliant On thyroid  medication-Synthroid 224 mcg -welchol  not ideal for thyroid  per Dr. Faythe Lab Results  Component Value Date   TSH 0.94 06/30/2022  A/P:TSH has been checked more recently than listed by Dr. Faythe- well controlled continue current medications    # BPH S: Medication: Tamsulosin 0.4 mg  A/P: reasonable control- continue current medications     # Migraines-follows with neurology S: Medication: Relpax  as needed-  infrequent, Topamax  200 mg -ajovy  not covered A/P: has upcoming visit with Dr. Ines in december- asked him to mention the new findings nonobstructive CAD     # Anxiety/insomnia S:Medication: Trial trazodone  April 2024 -In the past alprazolam,  Ambien  12.5 mg extended release -antidepressants/SSRI in past- severe libido decrease, range of emotion restriction Counseling:  in the past A/P: anxiety/stress reasonably well managed with being retired plus trazodone    # GERD- Dr. Rosalie manages S:Medication: Dexilant 60 mg  A/P: doing reaosnably well continue current medications     #Allergies-on Flonase    #neuropathy- chronic cold feet sensation . Sleeping in chair and feels pain in big toe in left great toe that's severe when wakes up from sleep. No color change that he's aware- but he will check.  No pain with walking -blood flow test ok in 2019 - we offered ankle-brachial index testing but he wants to start with trying to sleep in bed and see if resolves with that position first- his feet are hanging off stool so could be compression issue - he can call if changes mind and can discuss with Dr. Ines as well  #memory loss-essentially resolved with stopping Ambien , Xanax and getting better sleep with trazodone    # Prediabetes-A1c of 6.1 April late 2024   Recommended follow up: Return in about 4 months (around 02/14/2024) for followup or sooner if needed.Schedule b4 you leave. Or a little later if medicine spacing out better Future Appointments   Date Time Provider Department Center  12/31/2023  1:30 PM Ines Onetha NOVAK, MD [facility] None     Lab/Order associations:   ICD-10-CM   1. Immunization due  Z23 Flu vaccine HIGH DOSE PF(Fluzone Trivalent)      No orders of the defined types were placed in this encounter.   Return precautions advised. Garnette Lukes, MD

## 2023-10-16 ENCOUNTER — Ambulatory Visit: Payer: Self-pay | Admitting: Family Medicine

## 2023-10-16 ENCOUNTER — Other Ambulatory Visit (INDEPENDENT_AMBULATORY_CARE_PROVIDER_SITE_OTHER)

## 2023-10-16 DIAGNOSIS — Z131 Encounter for screening for diabetes mellitus: Secondary | ICD-10-CM | POA: Diagnosis not present

## 2023-10-16 DIAGNOSIS — Z79899 Other long term (current) drug therapy: Secondary | ICD-10-CM | POA: Diagnosis not present

## 2023-10-16 DIAGNOSIS — E785 Hyperlipidemia, unspecified: Secondary | ICD-10-CM | POA: Diagnosis not present

## 2023-10-16 DIAGNOSIS — R7303 Prediabetes: Secondary | ICD-10-CM | POA: Diagnosis not present

## 2023-10-16 LAB — LIPID PANEL
Cholesterol: 171 mg/dL (ref 0–200)
HDL: 51.1 mg/dL (ref >39.00)
LDL Cholesterol: 102 mg/dL — ABNORMAL HIGH (ref 0–99)
NonHDL: 119.53
Total CHOL/HDL Ratio: 3
Triglycerides: 90 mg/dL (ref 0.0–149.0)
VLDL: 18 mg/dL (ref 0.0–40.0)

## 2023-10-16 LAB — CBC WITH DIFFERENTIAL/PLATELET
Basophils Absolute: 0 K/uL (ref 0.0–0.1)
Basophils Relative: 0.8 % (ref 0.0–3.0)
Eosinophils Absolute: 0.2 K/uL (ref 0.0–0.7)
Eosinophils Relative: 3.3 % (ref 0.0–5.0)
HCT: 39.3 % (ref 39.0–52.0)
Hemoglobin: 12.3 g/dL — ABNORMAL LOW (ref 13.0–17.0)
Lymphocytes Relative: 27 % (ref 12.0–46.0)
Lymphs Abs: 1.6 K/uL (ref 0.7–4.0)
MCHC: 31.4 g/dL (ref 30.0–36.0)
MCV: 82.9 fl (ref 78.0–100.0)
Monocytes Absolute: 0.7 K/uL (ref 0.1–1.0)
Monocytes Relative: 12.3 % — ABNORMAL HIGH (ref 3.0–12.0)
Neutro Abs: 3.3 K/uL (ref 1.4–7.7)
Neutrophils Relative %: 56.6 % (ref 43.0–77.0)
Platelets: 247 K/uL (ref 150.0–400.0)
RBC: 4.74 Mil/uL (ref 4.22–5.81)
RDW: 16 % — ABNORMAL HIGH (ref 11.5–15.5)
WBC: 5.9 K/uL (ref 4.0–10.5)

## 2023-10-16 LAB — VITAMIN B12: Vitamin B-12: 213 pg/mL (ref 211–911)

## 2023-10-16 LAB — HEMOGLOBIN A1C: Hgb A1c MFr Bld: 6.7 % — ABNORMAL HIGH (ref 4.6–6.5)

## 2023-10-17 ENCOUNTER — Encounter: Payer: Self-pay | Admitting: Family Medicine

## 2023-10-18 ENCOUNTER — Other Ambulatory Visit: Payer: Self-pay | Admitting: Family Medicine

## 2023-10-18 DIAGNOSIS — D649 Anemia, unspecified: Secondary | ICD-10-CM

## 2023-10-19 NOTE — Telephone Encounter (Signed)
 Is this okay?

## 2023-10-20 LAB — COMPREHENSIVE METABOLIC PANEL WITH GFR
ALT: 11 U/L (ref 0–53)
AST: 15 U/L (ref 0–37)
Albumin: 4.3 g/dL (ref 3.5–5.2)
Alkaline Phosphatase: 79 U/L (ref 39–117)
BUN: 21 mg/dL (ref 6–23)
CO2: 21 meq/L (ref 19–32)
Calcium: 9.2 mg/dL (ref 8.4–10.5)
Chloride: 112 meq/L (ref 96–112)
Creatinine, Ser: 1.13 mg/dL (ref 0.40–1.50)
GFR: 67.81 mL/min (ref >60.00)
Glucose, Bld: 100 mg/dL — ABNORMAL HIGH (ref 70–99)
Potassium: 3.7 meq/L (ref 3.5–5.1)
Sodium: 139 meq/L (ref 135–145)
Total Bilirubin: 0.6 mg/dL (ref 0.2–1.2)
Total Protein: 7.7 g/dL (ref 6.0–8.3)

## 2023-10-24 ENCOUNTER — Encounter: Payer: Self-pay | Admitting: Family Medicine

## 2023-10-24 ENCOUNTER — Other Ambulatory Visit: Payer: Self-pay

## 2023-10-24 ENCOUNTER — Ambulatory Visit: Admitting: Family Medicine

## 2023-10-24 ENCOUNTER — Other Ambulatory Visit: Payer: Self-pay | Admitting: Family Medicine

## 2023-10-24 VITALS — BP 108/74 | HR 60 | Ht 72.0 in | Wt 227.0 lb

## 2023-10-24 DIAGNOSIS — N529 Male erectile dysfunction, unspecified: Secondary | ICD-10-CM

## 2023-10-24 DIAGNOSIS — M546 Pain in thoracic spine: Secondary | ICD-10-CM | POA: Diagnosis not present

## 2023-10-24 DIAGNOSIS — M5416 Radiculopathy, lumbar region: Secondary | ICD-10-CM | POA: Diagnosis not present

## 2023-10-24 DIAGNOSIS — G8929 Other chronic pain: Secondary | ICD-10-CM | POA: Diagnosis not present

## 2023-10-24 MED ORDER — KETOROLAC TROMETHAMINE 60 MG/2ML IM SOLN
60.0000 mg | Freq: Once | INTRAMUSCULAR | Status: AC
  Administered 2023-10-24: 60 mg via INTRAMUSCULAR

## 2023-10-24 NOTE — Progress Notes (Signed)
 I, Joseph Fuentes, CMA acting as a scribe for Joseph Lloyd, MD.  Joseph Fuentes is a 68 y.o. male who presents to [facility] today for back pain x 9 months. Pt locates pain to right side mid back and lower back. Avid golfer. Hx of hip bursitis. Denies bowel/bladder dysfunction. Denies SOB. Short-term relief wit Hydrocodone . Short-term relief with dry-needling.   Pt's last lumbar ESI, 04/09/23  Radiating pain:  hips LE numbness/tingling: denies LE weakness: denies Aggravates: movement, yard work golfing, ADL's requiring bending or twisting.  Treatments tried: Hydrocodone , ESI, PT  Patient has attended physical therapy at [facility] over the last 2 months for his thoracic spine pain.  Unfortunately this was not very effective and he still has significant pain.  Dx imaging: 06/06/23 T-spine XR  01/10/20 L-spine MRI  12/23/19 L-spine XR  Pertinent review of systems: No fevers or chills  Relevant historical information: Chronic migraine.  Chronic low back pain chronic thoracic back pain.   Exam:  BP 108/74   Pulse 60   Ht 6' (1.829 m)   Wt 227 lb (103 kg)   SpO2 99%   BMI 30.79 kg/m  General: Well Developed, well nourished, and in no acute distress.   MSK: T-spine: Normal appearing Nontender to palpation midline.  Tender palpation right thoracic paraspinal musculature near the rhomboid region.  L-spine normal appearing decreased lumbar motion lower extremity strength is intact   Lab and Radiology Results  Procedure: Real-time Ultrasound Guided Injection of trigger point right thoracic paraspinal musculature region of rhomboid. Device: Philips Affiniti 50G/GE Logiq Images permanently stored and available for review in PACS Verbal informed consent obtained.  Discussed risks and benefits of procedure. Warned about infection, bleeding, hyperglycemia damage to structures, pneumothorax among others. Patient expresses understanding and  agreement Time-out conducted.   Noted no overlying erythema, induration, or other signs of local infection.   Skin prepped in a sterile fashion.   Local anesthesia: Topical Ethyl chloride.   With sterile technique and under real time ultrasound guidance: 40 mg of Kenalog and 1 mL of lidocaine  injected into trigger point. Fluid seen entering the trigger point.   Completed without difficulty   Pain immediately resolved suggesting accurate placement of the medication.   Advised to call if fevers/chills, erythema, induration, drainage, or persistent bleeding.   Images permanently stored and available for review in the ultrasound unit.  Impression: Technically successful ultrasound guided injection.    EXAM: THORACIC SPINE 2 VIEWS   COMPARISON:  Chest radiograph 01/31/2022   FINDINGS: Mild to moderate thoracic spondylosis in the mid and lower thoracic spine. No thoracic spine fracture or acute subluxation.   Atherosclerotic calcification of the aortic arch.   IMPRESSION: 1. Mild to moderate thoracic spondylosis in the mid and lower thoracic spine. No fracture or subluxation observed. 2. Aortic Atherosclerosis (ICD10-I70.0).     Electronically Signed   By: Ryan Salvage M.D.   On: 06/06/2023 11:50  I, Joseph Fuentes, personally (independently) visualized and performed the interpretation of the images attached in this note.     Assessment and Plan: 68 y.o. male with chronic thoracic back pain.  Patient does have some degenerative changes although his pain has not improved with physical therapy as expected.  He has had a good 2 months of PT with dedicated home exercise program ongoing since May of this year.  At this point we will plan to proceed to MRI thoracic spine to further evaluate source of pain.  Anticipate recheck or direct epidural steroid injection order following MRI.  Additionally patient has chronic low back pain with lumbar radiculopathy.  This is a chronic ongoing  issue that has been treated over the long-term with intermittent epidural steroid injections.  His most recent lumbar epidural steroid injection was April 09, 2023.  Will go ahead and order another epidural steroid injection as this injection does work and provides greater than 80% pain relief lasting for longer than 3 months.  Pain has returned over the last 6 months.  Additionally Toradol  injection given today prior to discharge.  PDMP not reviewed this encounter. Orders Placed This Encounter  Procedures   US  LIMITED JOINT SPACE STRUCTURES UP BILAT(NO LINKED CHARGES)    Reason for Exam (SYMPTOM  OR DIAGNOSIS REQUIRED):   thoracic pain    Preferred imaging location?:   [facility]   MR THORACIC SPINE WO CONTRAST    Standing Status:   Future    Expiration Date:   10/23/2024    What is the patient's sedation requirement?:   No Sedation    Does the patient have a pacemaker or implanted devices?:   No    Preferred imaging location?:   [facility] (table limit-550lbs)   DG INJECT DIAG/THERA/INC NEEDLE/CATH/PLC EPI/LUMB/SAC W/IMG    Standing Status:   Future    Expiration Date:   10/23/2024    Reason for Exam (SYMPTOM  OR DIAGNOSIS REQUIRED):   Repeat lumbar epidural steroid injection.  Level technique per radiology.    Preferred Imaging Location?:   [facility]    Radiology Contrast Protocol - do NOT remove file path:   [email or web address]   Meds ordered this encounter  Medications   ketorolac  (TORADOL ) injection 60 mg     Discussed warning signs or symptoms. Please see discharge instructions. Patient expresses understanding.   The above documentation has been reviewed and is accurate and complete Joseph Fuentes, M.D.

## 2023-10-24 NOTE — Patient Instructions (Addendum)
 Thank you for coming in today.   You received an injection today. Seek immediate medical attention if the joint becomes red, extremely painful, or is oozing fluid.   Purple Gel Seat Cushion, 19.7 Extra Large Double Thick Chair Cushions for Office Chair, Wheelchair, Biomedical scientist, [place], Investment banker, operational for Tailbone Pain Relief, Cooling & Breathable Seat Cushions  Intramuscular Ketorolac  injection today.   See you back as needed.

## 2023-10-29 ENCOUNTER — Encounter: Payer: Self-pay | Admitting: Family Medicine

## 2023-11-01 ENCOUNTER — Encounter: Payer: Self-pay | Admitting: Neurology

## 2023-11-01 NOTE — Telephone Encounter (Signed)
 Spoke with patient and rescheduled appt with Dr. Penumalli for 01/01/24 at 4pm

## 2023-11-05 ENCOUNTER — Encounter: Payer: Self-pay | Admitting: Family Medicine

## 2023-11-06 ENCOUNTER — Ambulatory Visit
Admission: RE | Admit: 2023-11-06 | Discharge: 2023-11-06 | Disposition: A | Source: Ambulatory Visit | Attending: Family Medicine | Admitting: Family Medicine

## 2023-11-06 DIAGNOSIS — M47814 Spondylosis without myelopathy or radiculopathy, thoracic region: Secondary | ICD-10-CM | POA: Diagnosis not present

## 2023-11-06 DIAGNOSIS — G8929 Other chronic pain: Secondary | ICD-10-CM

## 2023-11-06 MED ORDER — DIAZEPAM 5 MG PO TABS: ORAL_TABLET | ORAL | 0 refills | Status: DC

## 2023-11-07 ENCOUNTER — Other Ambulatory Visit

## 2023-11-08 NOTE — Discharge Instructions (Signed)
 Post Procedure Spinal Discharge Instruction Sheet  You may resume a regular diet and any medications that you routinely take (including pain medications) unless otherwise noted by MD.  No driving day of procedure.  Light activity throughout the rest of the day.  Do not do any strenuous work, exercise, bending or lifting.  The day following the procedure, you can resume normal physical activity but you should refrain from exercising or physical therapy for at least three days thereafter.  You may apply ice to the injection site, 20 minutes on, 20 minutes off, as needed. Do not apply ice directly to skin.    Common Side Effects:  Headaches- take your usual medications as directed by your physician.  Increase your fluid intake.  Caffeinated beverages may be helpful.  Lie flat in bed until your headache resolves.  Restlessness or inability to sleep- you may have trouble sleeping for the next few days.  Ask your referring physician if you need any medication for sleep.  Facial flushing or redness- should subside within a few days.  Increased pain- a temporary increase in pain a day or two following your procedure is not unusual.  Take your pain medication as prescribed by your referring physician.  Leg cramps  Please contact our office at [phone] for the following symptoms: Fever greater than 100 degrees. Headaches unresolved with medication after 2-3 days. Increased swelling, pain, or redness at injection site.   Thank you for visiting [facility] today.   MAY RESUME ASPIRIN AS PRESCRIBED TODAY.

## 2023-11-09 ENCOUNTER — Ambulatory Visit: Payer: Self-pay | Admitting: Family Medicine

## 2023-11-09 ENCOUNTER — Ambulatory Visit
Admission: RE | Admit: 2023-11-09 | Discharge: 2023-11-09 | Disposition: A | Discharge status: Home / Self Care | Source: Ambulatory Visit | Attending: Family Medicine | Admitting: Family Medicine

## 2023-11-09 ENCOUNTER — Inpatient Hospital Stay
Admission: RE | Admit: 2023-11-09 | Discharge: 2023-11-09 | Disposition: A | Discharge status: Home / Self Care | Source: Ambulatory Visit | Attending: Family Medicine | Admitting: Family Medicine

## 2023-11-09 ENCOUNTER — Other Ambulatory Visit

## 2023-11-09 ENCOUNTER — Other Ambulatory Visit (INDEPENDENT_AMBULATORY_CARE_PROVIDER_SITE_OTHER)

## 2023-11-09 DIAGNOSIS — M47817 Spondylosis without myelopathy or radiculopathy, lumbosacral region: Secondary | ICD-10-CM | POA: Diagnosis not present

## 2023-11-09 DIAGNOSIS — N529 Male erectile dysfunction, unspecified: Secondary | ICD-10-CM | POA: Diagnosis not present

## 2023-11-09 DIAGNOSIS — M5416 Radiculopathy, lumbar region: Secondary | ICD-10-CM

## 2023-11-09 DIAGNOSIS — D649 Anemia, unspecified: Secondary | ICD-10-CM

## 2023-11-09 LAB — CBC WITH DIFFERENTIAL/PLATELET
Basophils Absolute: 0 K/uL (ref 0.0–0.1)
Basophils Relative: 0.6 % (ref 0.0–3.0)
Eosinophils Absolute: 0.1 K/uL (ref 0.0–0.7)
Eosinophils Relative: 2.2 % (ref 0.0–5.0)
HCT: 38.9 % — ABNORMAL LOW (ref 39.0–52.0)
Hemoglobin: 12.1 g/dL — ABNORMAL LOW (ref 13.0–17.0)
Lymphocytes Relative: 27.1 % (ref 12.0–46.0)
Lymphs Abs: 1.8 K/uL (ref 0.7–4.0)
MCHC: 31.1 g/dL (ref 30.0–36.0)
MCV: 82.9 fl (ref 78.0–100.0)
Monocytes Absolute: 0.7 K/uL (ref 0.1–1.0)
Monocytes Relative: 10.7 % (ref 3.0–12.0)
Neutro Abs: 3.9 K/uL (ref 1.4–7.7)
Neutrophils Relative %: 59.4 % (ref 43.0–77.0)
Platelets: 233 K/uL (ref 150.0–400.0)
RBC: 4.69 Mil/uL (ref 4.22–5.81)
RDW: 16.4 % — ABNORMAL HIGH (ref 11.5–15.5)
WBC: 6.5 K/uL (ref 4.0–10.5)

## 2023-11-09 LAB — TESTOSTERONE: Testosterone: 340.31 ng/dL (ref 300.00–890.00)

## 2023-11-09 MED ORDER — METHYLPREDNISOLONE ACETATE 40 MG/ML INJ SUSP (RADIOLOG
80.0000 mg | Freq: Once | INTRAMUSCULAR | Status: AC
  Administered 2023-11-09: 80 mg via EPIDURAL

## 2023-11-09 MED ORDER — IOPAMIDOL (ISOVUE-200) INJECTION 41%
3.0000 mL | Freq: Once | INTRAVENOUS | Status: AC | PRN
  Administered 2023-11-09: 3 mL

## 2023-11-09 MED ORDER — LIDOCAINE 1 % OPTIME INJ - NO CHARGE
5.0000 mL | Freq: Once | INTRAMUSCULAR | Status: AC
  Administered 2023-11-09: 5 mL via INTRADERMAL

## 2023-11-09 NOTE — Discharge Instructions (Signed)

## 2023-11-12 ENCOUNTER — Ambulatory Visit: Payer: Self-pay | Admitting: Family Medicine

## 2023-11-12 NOTE — Progress Notes (Signed)
 Thoracic spine MRI shows mild arthritis.  There is no obvious pinched nerve causing your upper back pain.  Recommend return to clinic to go over results in the near future.

## 2023-11-13 ENCOUNTER — Other Ambulatory Visit: Payer: Self-pay | Admitting: Family Medicine

## 2023-11-13 DIAGNOSIS — D649 Anemia, unspecified: Secondary | ICD-10-CM

## 2023-11-16 ENCOUNTER — Other Ambulatory Visit

## 2023-11-19 ENCOUNTER — Ambulatory Visit: Admitting: Family Medicine

## 2023-11-19 VITALS — BP 124/82 | HR 65 | Wt 227.0 lb

## 2023-11-19 DIAGNOSIS — M546 Pain in thoracic spine: Secondary | ICD-10-CM

## 2023-11-19 DIAGNOSIS — G8929 Other chronic pain: Secondary | ICD-10-CM | POA: Diagnosis not present

## 2023-11-19 DIAGNOSIS — M5414 Radiculopathy, thoracic region: Secondary | ICD-10-CM

## 2023-11-19 MED ORDER — PREGABALIN 75 MG PO CAPS: 75.0000 mg | ORAL_CAPSULE | Freq: Two times a day (BID) | ORAL | 3 refills | Status: DC | PRN

## 2023-11-19 NOTE — Progress Notes (Signed)
 Joseph Ileana Collet, PhD, LAT, ATC acting as a scribe for [name], MD.  Joseph Fuentes is a 68 y.o. male who presents to [facility] at [facility] today for f/u thoracic back pain and lumbar radiculopathy w/ MRI review. Pt was last seen by Dr. Lloyd on 10/24/23 and was given a R-sided thoracic region trigger point steroid injection.Toradol  IM injection given. Lumbar ESI and T-spine MRI ordered.  Today, pt reports low back pain is much improved after ESI on 10/10. Mid-back is still painful, R-sided. He is wondering about next step.    Primary pain is located in the right lower thoracic region inferior to the scapula.  Dx imaging: 11/06/23 T-spine MRI 06/06/23 T-spine XR             01/10/20 L-spine MRI             12/23/19 L-spine XR  Pertinent review of systems: No fevers or chills  Relevant historical information: Hx Migraine   Exam:  BP 124/82   Pulse 65   Wt 227 lb (103 kg)   SpO2 98%   BMI 30.79 kg/m  General: Well Developed, well nourished, and in no acute distress.   MSK: T-spine: Normal appearing Nontender palpation midline.  Tender palpation right thoracic paraspinal musculature. Normal thoracic motion.    Lab and Radiology Results  EXAM: MRI THORACIC SPINE WITHOUT INTRAVENOUS CONTRAST 11/06/2023 08:17:24 AM   TECHNIQUE: Multiplanar multisequence MRI of the thoracic spine was performed without the administration of intravenous contrast.   COMPARISON: None available.   CLINICAL HISTORY: Mid-back pain with burning for 6-8 months.   FINDINGS:   BONES AND ALIGNMENT: Normal alignment. No fracture, suspicious marrow lesion, or significant marrow edema.   SPINAL CORD: Normal spinal cord signal and morphology.   SOFT TISSUES: Unremarkable.   DEGENERATIVE CHANGES: Mild lower thoracic disc bulging. Small central disc protrusions at T5-T6 and T6-T7. Small right central disc protrusion at T8-T9. Mild multilevel facet hypertrophy. No  significant stenosis or spinal cord mass effect.   IMPRESSION: 1. Mild thoracic disc and facet degeneration without stenosis.   Electronically signed by: Dasie Hamburg MD 11/09/2023 03:41 PM EDT RP Workstation: [id number] I, [name], personally (independently) visualized and performed the interpretation of the images attached in this note.     Assessment and Plan: 68 y.o. male with right thoracic back pain.  He does have some bulging disc with potential pinched nerve that could be responsible for his pain as could exacerbation of degenerative changes or muscle spasm.  He already has a trial of physical therapy which did not help.  Plan for Lyrica and thoracic epidural steroid injection.  We also talked about capsaicin cream or patches that may be helpful.   PDMP not reviewed this encounter. Orders Placed This Encounter  Procedures   DG INJECT DIAG/THERA/INC NEEDLE/CATH/PLC EPI/CERV/THOR [facility]    Thoracic ESI. Level & technique per radiology    Standing Status:   Future    Expiration Date:   12/20/2023    Reason for Exam (SYMPTOM  OR DIAGNOSIS REQUIRED):   thoracic spine pain    Preferred Imaging Location?:   [facility]   Meds ordered this encounter  Medications   pregabalin (LYRICA) 75 MG capsule    Sig: Take 1 capsule (75 mg total) by mouth 2 (two) times daily as needed.    Dispense:  60 capsule    Refill:  3     Discussed warning signs or symptoms. Please see  discharge instructions. Patient expresses understanding.   The above documentation has been reviewed and is accurate and complete [name], M.D.

## 2023-11-19 NOTE — Patient Instructions (Addendum)
 Thank you for coming in today.   Consider capsaicin cream. I would recommend using the 0.025% strength first.   Capzix High Potency Pain Relief Cream: At 0.1% capsaicin, this is considered a high-potency, over-the-counter option. It is formulated to provide temporary relief for arthritis, backaches, and muscle strains. Globe Capsaicin 0.1% High Potency Pain Relief Cream: A pharmacist-recommended, high-potency formula with 0.1% capsaicin for deep, penetrating pain relief. [place] Capsaicin Cream: This brand offers both a standard strength (0.025%) and a high potency alternative (0.075%) comparable to Zostrix HP.  Start lyrica.   Please call [facility] ([facility]) at [phone] to schedule your spine injection.

## 2023-11-23 ENCOUNTER — Other Ambulatory Visit: Payer: Self-pay | Admitting: Family Medicine

## 2023-11-25 ENCOUNTER — Other Ambulatory Visit: Payer: Self-pay | Admitting: Family Medicine

## 2023-11-26 ENCOUNTER — Encounter: Payer: Self-pay | Admitting: Family Medicine

## 2023-11-26 ENCOUNTER — Other Ambulatory Visit: Payer: Self-pay

## 2023-11-26 ENCOUNTER — Telehealth: Payer: Self-pay | Admitting: Neurology

## 2023-11-26 DIAGNOSIS — G43709 Chronic migraine without aura, not intractable, without status migrainosus: Secondary | ICD-10-CM

## 2023-11-26 MED ORDER — IBUPROFEN 800 MG PO TABS: 800.0000 mg | ORAL_TABLET | Freq: Two times a day (BID) | ORAL | 3 refills | Status: AC | PRN

## 2023-11-26 MED ORDER — TOPIRAMATE 200 MG PO TABS: 200.0000 mg | ORAL_TABLET | Freq: Every day | ORAL | 0 refills | Status: DC

## 2023-11-26 NOTE — Telephone Encounter (Signed)
 Pt called stating that he will be running out of his topiramate  (TOPAMAX ) 200 MG tablet before he see's his new provider that he is scheduled with and he would like to know if his  topiramate  (TOPAMAX ) 200 MG tablet can be sent in for a 90 day fill to the [facility] on [address]. He states he will have to pay the same amount whether he gets a months worth of medications or 90 days so he would prefer to pay that and get 90 day's worth. Please advise.

## 2023-11-26 NOTE — Telephone Encounter (Signed)
 Yes patient last saw Sarah NP who advised him to continue Topamax . I refilled this under her name.

## 2023-12-05 ENCOUNTER — Encounter: Payer: Self-pay | Admitting: Family Medicine

## 2023-12-06 MED ORDER — DIAZEPAM 5 MG PO TABS: ORAL_TABLET | ORAL | 5 refills | Status: AC

## 2023-12-07 NOTE — Discharge Instructions (Signed)

## 2023-12-10 ENCOUNTER — Inpatient Hospital Stay
Admission: RE | Admit: 2023-12-10 | Discharge: 2023-12-10 | Disposition: A | Discharge status: Home / Self Care | Source: Ambulatory Visit | Attending: Family Medicine | Admitting: Family Medicine

## 2023-12-20 NOTE — Discharge Instructions (Signed)

## 2023-12-21 ENCOUNTER — Ambulatory Visit
Admission: RE | Admit: 2023-12-21 | Discharge: 2023-12-21 | Disposition: A | Discharge status: Home / Self Care | Source: Ambulatory Visit | Attending: Family Medicine | Admitting: Family Medicine

## 2023-12-21 DIAGNOSIS — G8929 Other chronic pain: Secondary | ICD-10-CM

## 2023-12-21 MED ORDER — IOPAMIDOL (ISOVUE-M 300) INJECTION 61%
1.0000 mL | Freq: Once | INTRAMUSCULAR | Status: AC
  Administered 2023-12-21: 1 mL via EPIDURAL

## 2023-12-21 MED ORDER — TRIAMCINOLONE ACETONIDE 40 MG/ML IJ SUSP (RADIOLOGY)
60.0000 mg | Freq: Once | INTRAMUSCULAR | Status: AC
  Administered 2023-12-21: 60 mg via EPIDURAL

## 2023-12-26 DIAGNOSIS — H43392 Other vitreous opacities, left eye: Secondary | ICD-10-CM | POA: Diagnosis not present

## 2023-12-26 DIAGNOSIS — H53142 Visual discomfort, left eye: Secondary | ICD-10-CM | POA: Diagnosis not present

## 2023-12-31 ENCOUNTER — Ambulatory Visit: Admitting: Neurology

## 2023-12-31 ENCOUNTER — Telehealth: Payer: Self-pay | Admitting: Neurology

## 2023-12-31 NOTE — Telephone Encounter (Signed)
 Request to reschedule appointment w/ wait list

## 2024-01-01 ENCOUNTER — Ambulatory Visit: Admitting: Diagnostic Neuroimaging

## 2024-01-15 DIAGNOSIS — G4733 Obstructive sleep apnea (adult) (pediatric): Secondary | ICD-10-CM | POA: Diagnosis not present

## 2024-02-04 ENCOUNTER — Telehealth

## 2024-02-04 DIAGNOSIS — J189 Pneumonia, unspecified organism: Secondary | ICD-10-CM

## 2024-02-04 MED ORDER — PREDNISONE 20 MG PO TABS: 40.0000 mg | ORAL_TABLET | Freq: Every day | ORAL | 0 refills | Status: DC

## 2024-02-04 MED ORDER — LEVOFLOXACIN 500 MG PO TABS: 500.0000 mg | ORAL_TABLET | Freq: Every day | ORAL | 0 refills | Status: AC

## 2024-02-04 MED ORDER — ALBUTEROL SULFATE HFA 108 (90 BASE) MCG/ACT IN AERS: 1.0000 | INHALATION_SPRAY | Freq: Four times a day (QID) | RESPIRATORY_TRACT | 0 refills | Status: AC | PRN

## 2024-02-04 MED ORDER — BENZONATATE 100 MG PO CAPS: 100.0000 mg | ORAL_CAPSULE | Freq: Three times a day (TID) | ORAL | 0 refills | Status: DC | PRN

## 2024-02-04 MED ORDER — PROMETHAZINE-DM 6.25-15 MG/5ML PO SYRP: 5.0000 mL | ORAL_SOLUTION | Freq: Four times a day (QID) | ORAL | 0 refills | Status: DC | PRN

## 2024-02-04 NOTE — Progress Notes (Signed)
 " Virtual Visit Consent   Joseph Fuentes, you are scheduled for a virtual visit with a [facility] provider today. Just as with appointments in the office, your consent must be obtained to participate. Your consent will be active for this visit and any virtual visit you may have with one of our providers in the next 365 days. If you have a MyChart account, a copy of this consent can be sent to you electronically.  As this is a virtual visit, video technology does not allow for your provider to perform a traditional examination. This may limit your provider's ability to fully assess your condition. If your provider identifies any concerns that need to be evaluated in person or the need to arrange testing (such as labs, EKG, etc.), we will make arrangements to do so. Although advances in technology are sophisticated, we cannot ensure that it will always work on either your end or our end. If the connection with a video visit is poor, the visit may have to be switched to a telephone visit. With either a video or telephone visit, we are not always able to ensure that we have a secure connection.  By engaging in this virtual visit, you consent to the provision of healthcare and authorize for your insurance to be billed (if applicable) for the services provided during this visit. Depending on your insurance coverage, you may receive a charge related to this service.  I need to obtain your verbal consent now. Are you willing to proceed with your visit today? Joseph Fuentes has provided verbal consent on 02/04/2024 for a virtual visit (video or telephone). Delon [name], PA-C  Date: 02/04/2024 11:15 AM   Virtual Visit via Video Note   I, Delon [name], connected with  Joseph Fuentes  ([id number], 08-Mar-1955) on 02/04/2024 at 11:00 AM EST by a video-enabled telemedicine application and verified that I am speaking with the correct person using two identifiers.  Location: Patient: Virtual Visit Location  Patient: Home Provider: Virtual Visit Location Provider: Home Office   I discussed the limitations of evaluation and management by telemedicine and the availability of in person appointments. The patient expressed understanding and agreed to proceed.    History of Present Illness: Joseph Fuentes is a 69 y.o. who identifies as a male who was assigned male at birth, and is being seen today for cough and chest pain.  HPI: URI  This is a new problem. The current episode started 1 to 4 weeks ago (2 weeks, started around 01/22/24). The problem has been gradually worsening. The maximum temperature recorded prior to his arrival was more than 104 F (104.5 reported night of 02/02/24, now been ranging from 98-102). The fever has been present for Less than 1 day. Associated symptoms include chest pain (from coughing), congestion, coughing (productive of yellow-green mucus), diarrhea, headaches, nausea (last night), rhinorrhea (and post nasal drainage) and wheezing (started 02/02/24). Pertinent negatives include no ear pain, plugged ear sensation, sinus pain, sore throat (more dry and irritated) or vomiting. Associated symptoms comments: Mild SOB. Treatments tried: dayquil, nyquil, mucinex, hydrodone cough med left over from last year. The treatment provided no relief.     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   Chronic back pain on long term narcotics 02/17/2022   Hypothyroidism 02/17/2022   BPH associated with nocturia 02/17/2022   OSA on CPAP 02/17/2022   Memory change 02/07/2022   Chronic migraine  without aura without status migrainosus, not intractable 05/12/2020   Other allergic rhinitis 04/24/2019   Polyneuropathy 11/13/2018   Pain in joint, shoulder region 08/19/2014   Left knee pain 04/30/2013   Plantar fasciitis 03/06/2013   Hyperlipidemia, unspecified 04/20/2009   Anxiety state 04/20/2009   Gastroparesis 04/07/2009   GERD  03/10/2009   Irritable bowel syndrome 03/10/2009    Allergies: Allergies[1] Medications: Current Medications[2]  Observations/Objective: Patient is well-developed, well-nourished in no acute distress.  Resting comfortably at home.  Head is normocephalic, atraumatic.  No labored breathing.  Speech is clear and coherent with logical content.  Patient is alert and oriented at baseline.    Assessment and Plan: 1. Community acquired pneumonia, unspecified laterality (Primary) - levofloxacin  (LEVAQUIN ) 500 MG tablet; Take 1 tablet (500 mg total) by mouth daily for 7 days.  Dispense: 7 tablet; Refill: 0 - predniSONE  (DELTASONE ) 20 MG tablet; Take 2 tablets (40 mg total) by mouth daily with breakfast.  Dispense: 14 tablet; Refill: 0 - albuterol  (VENTOLIN  HFA) 108 (90 Base) MCG/ACT inhaler; Inhale 1-2 puffs into the lungs every 6 (six) hours as needed.  Dispense: 8 g; Refill: 0 - promethazine -dextromethorphan (PROMETHAZINE -DM) 6.25-15 MG/5ML syrup; Take 5 mLs by mouth 4 (four) times daily as needed.  Dispense: 118 mL; Refill: 0 - benzonatate  (TESSALON ) 100 MG capsule; Take 1-2 capsules (100-200 mg total) by mouth 3 (three) times daily as needed.  Dispense: 30 capsule; Refill: 0  - Worsening over a week despite OTC medications - Will treat with Levaquin , Prednisone , Albuterol , Promethazine  DM and tessalon  perles - Can continue Mucinex (PLAIN) during the daytime - Push fluids.  - Rest.  - Steam and humidifier can help - Seek in person evaluation if worsening or symptoms fail to improve    Follow Up Instructions: I discussed the assessment and treatment plan with the patient. The patient was provided an opportunity to ask questions and all were answered. The patient agreed with the plan and demonstrated an understanding of the instructions.  A copy of instructions were sent to the patient via MyChart unless otherwise noted below.    The patient was advised to call back or seek an in-person  evaluation if the symptoms worsen or if the condition fails to improve as anticipated.    Delon [name], PA-C     [1]  Allergies Allergen Reactions   Gabapentin     Oxycodone Itching, Nausea And Vomiting and Nausea Only   Penicillins     Reaction occurred as an infant; parents told him never to take PCN   Terbinafine And Related Rash    All over body  [2]  Current Outpatient Medications:    albuterol  (VENTOLIN  HFA) 108 (90 Base) MCG/ACT inhaler, Inhale 1-2 puffs into the lungs every 6 (six) hours as needed., Disp: 8 g, Rfl: 0   benzonatate  (TESSALON ) 100 MG capsule, Take 1-2 capsules (100-200 mg total) by mouth 3 (three) times daily as needed., Disp: 30 capsule, Rfl: 0   levofloxacin  (LEVAQUIN ) 500 MG tablet, Take 1 tablet (500 mg total) by mouth daily for 7 days., Disp: 7 tablet, Rfl: 0   predniSONE  (DELTASONE ) 20 MG tablet, Take 2 tablets (40 mg total) by mouth daily with breakfast., Disp: 14 tablet, Rfl: 0   promethazine -dextromethorphan (PROMETHAZINE -DM) 6.25-15 MG/5ML syrup, Take 5 mLs by mouth 4 (four) times daily as needed., Disp: 118 mL, Rfl: 0   ALLEGRA-D ALLERGY & CONGESTION 180-240 MG 24 hr tablet, Take 1 tablet by mouth daily. , Disp: , Rfl:  3   aspirin-acetaminophen -caffeine (EXCEDRIN MIGRAINE) 250-250-65 MG tablet, Take by mouth every 6 (six) hours as needed for headache., Disp: , Rfl:    colesevelam  (WELCHOL ) 625 MG tablet, TAKE 3 TABLETS (1,875 MG TOTAL) BY MOUTH 2 (TWO) TIMES DAILY WITH A MEAL., Disp: 540 tablet, Rfl: 3   desoximetasone (TOPICORT) 0.25 % cream, Apply 1 application topically daily as needed., Disp: , Rfl:    DEXILANT 60 MG capsule, Take 60 mg by mouth every other day., Disp: , Rfl:    diazepam  (VALIUM ) 5 MG tablet, Take 1 pill 30 to 60 minutes prior to MRI or back injection., Disp: 1 tablet, Rfl: 5   docusate sodium (COLACE) 100 MG capsule, Take 100 mg by mouth daily. , Disp: , Rfl:    eletriptan  (RELPAX ) 40 MG tablet, Take 1 tablet (40 mg total) by  mouth as needed for migraine or headache. May repeat in 2 hours if headache persists or recurs., Disp: 42 tablet, Rfl: 3   ezetimibe  (ZETIA ) 10 MG tablet, Take 1 tablet (10 mg total) by mouth daily., Disp: 90 tablet, Rfl: 1   fluticasone  (CUTIVATE ) 0.005 % ointment, Apply 1 application topically 2 (two) times daily., Disp: , Rfl:    fluticasone  (FLONASE ) 50 MCG/ACT nasal spray, Place 2 sprays into both nostrils daily., Disp: 48 g, Rfl: 3   HYDROcodone -acetaminophen  (NORCO) 10-325 MG tablet, Take 1 tablet by mouth 2 (two) times daily as needed (do not drive for 8 hours after taking.)., Disp: 60 tablet, Rfl: 0   ibuprofen  (ADVIL ) 800 MG tablet, Take 1 tablet (800 mg total) by mouth 2 (two) times daily as needed., Disp: 180 tablet, Rfl: 3   ipratropium (ATROVENT ) 0.06 % nasal spray, 1 spray in each nostril 2 times daily, Disp: 45 mL, Rfl: 3   NAFTIN 2 % CREA, as needed., Disp: , Rfl:    omeprazole (PRILOSEC) 40 MG capsule, Take 1-2 tablets by mouth at bedtime. , Disp: , Rfl:    POTASSIUM PO, Take 99 mg by mouth. , Disp: , Rfl:    pregabalin  (LYRICA ) 75 MG capsule, Take 1 capsule (75 mg total) by mouth 2 (two) times daily as needed., Disp: 60 capsule, Rfl: 3   SYNTHROID 112 MCG tablet, Take 112 mcg by mouth daily before breakfast., Disp: , Rfl:    tamsulosin (FLOMAX) 0.4 MG CAPS capsule, 1 capsule 30 minutes after the same meal each day, Disp: , Rfl:    topiramate  (TOPAMAX ) 200 MG tablet, Take 1 tablet (200 mg total) by mouth daily., Disp: 90 tablet, Rfl: 0   traZODone  (DESYREL ) 50 MG tablet, TAKE 1/2 TO 1 TABLET BY MOUTH AT BEDTIME AS NEEDED FOR SLEEP, Disp: 90 tablet, Rfl: 2   triamcinolone  ointment (KENALOG ) 0.1 %, as needed., Disp: , Rfl:   "

## 2024-02-04 NOTE — Patient Instructions (Signed)
 " Joseph Fuentes, thank you for joining Joseph [name], PA-C for today's virtual visit.  While this provider is not your primary care provider (PCP), if your PCP is located in our provider database this encounter information will be shared with them immediately following your visit.   A [facility] MyChart account gives you access to today's visit and all your visits, tests, and labs performed at [facility]  click here if you don't have a [facility] MyChart account or go to [email or web address]  Consent: (Patient) Joseph Fuentes provided verbal consent for this virtual visit at the beginning of the encounter.  Current Medications:  Current Outpatient Medications:    albuterol  (VENTOLIN  HFA) 108 (90 Base) MCG/ACT inhaler, Inhale 1-2 puffs into the lungs every 6 (six) hours as needed., Disp: 8 g, Rfl: 0   benzonatate  (TESSALON ) 100 MG capsule, Take 1-2 capsules (100-200 mg total) by mouth 3 (three) times daily as needed., Disp: 30 capsule, Rfl: 0   levofloxacin  (LEVAQUIN ) 500 MG tablet, Take 1 tablet (500 mg total) by mouth daily for 7 days., Disp: 7 tablet, Rfl: 0   predniSONE  (DELTASONE ) 20 MG tablet, Take 2 tablets (40 mg total) by mouth daily with breakfast., Disp: 14 tablet, Rfl: 0   promethazine -dextromethorphan (PROMETHAZINE -DM) 6.25-15 MG/5ML syrup, Take 5 mLs by mouth 4 (four) times daily as needed., Disp: 118 mL, Rfl: 0   ALLEGRA-D ALLERGY & CONGESTION 180-240 MG 24 hr tablet, Take 1 tablet by mouth daily. , Disp: , Rfl: 3   aspirin-acetaminophen -caffeine (EXCEDRIN MIGRAINE) 250-250-65 MG tablet, Take by mouth every 6 (six) hours as needed for headache., Disp: , Rfl:    colesevelam  (WELCHOL ) 625 MG tablet, TAKE 3 TABLETS (1,875 MG TOTAL) BY MOUTH 2 (TWO) TIMES DAILY WITH A MEAL., Disp: 540 tablet, Rfl: 3   desoximetasone (TOPICORT) 0.25 % cream, Apply 1 application topically daily as needed., Disp: , Rfl:    DEXILANT 60 MG capsule, Take 60 mg by mouth every other  day., Disp: , Rfl:    diazepam  (VALIUM ) 5 MG tablet, Take 1 pill 30 to 60 minutes prior to MRI or back injection., Disp: 1 tablet, Rfl: 5   docusate sodium (COLACE) 100 MG capsule, Take 100 mg by mouth daily. , Disp: , Rfl:    eletriptan  (RELPAX ) 40 MG tablet, Take 1 tablet (40 mg total) by mouth as needed for migraine or headache. May repeat in 2 hours if headache persists or recurs., Disp: 42 tablet, Rfl: 3   ezetimibe  (ZETIA ) 10 MG tablet, Take 1 tablet (10 mg total) by mouth daily., Disp: 90 tablet, Rfl: 1   fluticasone  (CUTIVATE ) 0.005 % ointment, Apply 1 application topically 2 (two) times daily., Disp: , Rfl:    fluticasone  (FLONASE ) 50 MCG/ACT nasal spray, Place 2 sprays into both nostrils daily., Disp: 48 g, Rfl: 3   HYDROcodone -acetaminophen  (NORCO) 10-325 MG tablet, Take 1 tablet by mouth 2 (two) times daily as needed (do not drive for 8 hours after taking.)., Disp: 60 tablet, Rfl: 0   ibuprofen  (ADVIL ) 800 MG tablet, Take 1 tablet (800 mg total) by mouth 2 (two) times daily as needed., Disp: 180 tablet, Rfl: 3   ipratropium (ATROVENT ) 0.06 % nasal spray, 1 spray in each nostril 2 times daily, Disp: 45 mL, Rfl: 3   NAFTIN 2 % CREA, as needed., Disp: , Rfl:    omeprazole (PRILOSEC) 40 MG capsule, Take 1-2 tablets by mouth at bedtime. , Disp: , Rfl:    POTASSIUM PO,  Take 99 mg by mouth. , Disp: , Rfl:    pregabalin  (LYRICA ) 75 MG capsule, Take 1 capsule (75 mg total) by mouth 2 (two) times daily as needed., Disp: 60 capsule, Rfl: 3   SYNTHROID 112 MCG tablet, Take 112 mcg by mouth daily before breakfast., Disp: , Rfl:    tamsulosin (FLOMAX) 0.4 MG CAPS capsule, 1 capsule 30 minutes after the same meal each day, Disp: , Rfl:    topiramate  (TOPAMAX ) 200 MG tablet, Take 1 tablet (200 mg total) by mouth daily., Disp: 90 tablet, Rfl: 0   traZODone  (DESYREL ) 50 MG tablet, TAKE 1/2 TO 1 TABLET BY MOUTH AT BEDTIME AS NEEDED FOR SLEEP, Disp: 90 tablet, Rfl: 2   triamcinolone  ointment (KENALOG ) 0.1  %, as needed., Disp: , Rfl:    Medications ordered in this encounter:  Meds ordered this encounter  Medications   levofloxacin  (LEVAQUIN ) 500 MG tablet    Sig: Take 1 tablet (500 mg total) by mouth daily for 7 days.    Dispense:  7 tablet    Refill:  0    Supervising Provider:   LAMPTEY, PHILIP O [[id number]]   predniSONE  (DELTASONE ) 20 MG tablet    Sig: Take 2 tablets (40 mg total) by mouth daily with breakfast.    Dispense:  14 tablet    Refill:  0    Supervising Provider:   BLAISE ALEENE KIDD [[id number]]   albuterol  (VENTOLIN  HFA) 108 (90 Base) MCG/ACT inhaler    Sig: Inhale 1-2 puffs into the lungs every 6 (six) hours as needed.    Dispense:  8 g    Refill:  0    Supervising Provider:   BLAISE ALEENE KIDD [[id number]]   promethazine -dextromethorphan (PROMETHAZINE -DM) 6.25-15 MG/5ML syrup    Sig: Take 5 mLs by mouth 4 (four) times daily as needed.    Dispense:  118 mL    Refill:  0    Supervising Provider:   LAMPTEY, PHILIP O [[id number]]   benzonatate  (TESSALON ) 100 MG capsule    Sig: Take 1-2 capsules (100-200 mg total) by mouth 3 (three) times daily as needed.    Dispense:  30 capsule    Refill:  0    Supervising Provider:   BLAISE ALEENE KIDD [[id number]]     *If you need refills on other medications prior to your next appointment, please contact your pharmacy*  Follow-Up: Call back or seek an in-person evaluation if the symptoms worsen or if the condition fails to improve as anticipated.  [facility] [phone]  Other Instructions Community-Acquired Pneumonia, Adult Pneumonia is a lung infection that causes inflammation and the buildup of mucus and fluids in the lungs. This may cause coughing and difficulty breathing. Community-acquired pneumonia is pneumonia that develops in people who are not, and have not recently been, in a hospital or other health care facility. Usually, pneumonia develops as a result of an illness that is caused by a virus, such as the common  cold and the flu (influenza). It can also be caused by bacteria or fungi. While the common cold and influenza can pass from person to person (are contagious), pneumonia itself is not considered contagious. What are the causes? This condition may be caused by: Viruses. Bacteria. Fungi. What increases the risk? The following factors may make you more likely to develop this condition: Being over age 32 or having certain medical conditions, such as: A long-term (chronic) disease, such as: chronic obstructive pulmonary disease (COPD), asthma, heart failure,  diabetes, or kidney disease. A condition that increases the risk of breathing in (aspirating) mucus and other fluids from your mouth and nose. A weakened body defense system (immune system). Having had your spleen removed (splenectomy). The spleen is the organ that helps fight germs and infections. Not cleaning your teeth and gums well (poor dental hygiene). Using tobacco products. Traveling to places where germs that cause pneumonia are present or being near certain animals or animal habitats that could have germs that cause pneumonia. What are the signs or symptoms? Symptoms of this condition include: A dry cough or a wet (productive) cough. A fever, sweating, or chills. Chest pain, especially when breathing deeply or coughing. Fast breathing, difficulty breathing, or shortness of breath. Tiredness (fatigue) and muscle aches. How is this diagnosed? This condition may be diagnosed based on your medical history or a physical exam. You may also have tests, including: Imaging, such as a chest X-ray or lung ultrasound. Tests of: The level of oxygen and other gases in your blood. Mucus from your lungs (sputum). Fluid around your lungs (pleural fluid). Your urine. How is this treated? Treatment for this condition depends on many factors, such as the cause of your pneumonia, your medicines, and other medical conditions that you have. For  most adults, pneumonia may be treated at home. In some cases, treatment must happen in a hospital and may include: Medicines that are given by mouth (orally) or through an IV, including: Antibiotic medicines, if bacteria caused the pneumonia. Medicines that kill viruses (antiviral medicines), if a virus caused the pneumonia. Oxygen therapy. Severe pneumonia, although rare, may require the following treatments: Mechanical ventilation.This procedure uses a machine to help you breathe if you cannot breathe well on your own or maintain a safe level of blood oxygen. Thoracentesis. This procedure removes any buildup of pleural fluid to help with breathing. Follow these instructions at home:  Medicines Take over-the-counter and prescription medicines only as told by your health care provider. Take cough medicine only if you have trouble sleeping. Cough medicine can prevent your body from removing mucus from your lungs. If you were prescribed antibiotics, take them as told by your health care provider. Do not stop taking the antibiotic even if you start to feel better. Lifestyle     Do not drink alcohol. Do not use any products that contain nicotine or tobacco. These products include cigarettes, chewing tobacco, and vaping devices, such as e-cigarettes. If you need help quitting, ask your health care provider. Eat a healthy diet. This includes plenty of vegetables, fruits, whole grains, low-fat dairy products, and lean protein. General instructions Rest a lot and get at least 8 hours of sleep each night. Sleep in a partly upright position at night. Place a few pillows under your head or sleep in a reclining chair. Return to your normal activities as told by your health care provider. Ask your health care provider what activities are safe for you. Drink enough fluid to keep your urine pale yellow. This helps to thin the mucus in your lungs. If your throat is sore, gargle with a mixture of salt and  water 3-4 times a day or as needed. To make salt water, completely dissolve -1 tsp (3-6 g) of salt in 1 cup (237 mL) of warm water. Keep all follow-up visits. How is this prevented? You can lower your risk of developing community-acquired pneumonia by: Getting the pneumonia vaccine. There are different types and schedules of pneumonia vaccines. Ask your health care provider  which option is best for you. Consider getting the pneumonia vaccine if: You are older than 67 years of age. You are 36-24 years of age and are receiving cancer treatment, have chronic lung disease, or have other medical conditions that affect your immune system. Ask your health care provider if this applies to you. Getting your influenza vaccine every year. Ask your health care provider which type of vaccine is best for you. Getting regular dental checkups. Washing your hands often with soap and water for at least 20 seconds. If soap and water are not available, use hand sanitizer. Contact a health care provider if: You have a fever. You have trouble sleeping because you cannot control your cough with cough medicine. Get help right away if: Your shortness of breath becomes worse. Your chest pain increases. Your sickness becomes worse, especially if you are an older adult or have a weak immune system. You cough up blood. These symptoms may be an emergency. Get help right away. Call 911. Do not wait to see if the symptoms will go away. Do not drive yourself to the hospital. Summary Pneumonia is an infection of the lungs. Community-acquired pneumonia develops in people who have not been in the hospital. It can be caused by bacteria, viruses, or fungi. This condition may be treated with antibiotics or antiviral medicines. Severe pneumonia may require a hospital stay and treatment to help with breathing. This information is not intended to replace advice given to you by your health care provider. Make sure you discuss any  questions you have with your health care provider. Document Revised: 12/20/2022 Document Reviewed: 03/16/2021 Elsevier Patient Education  [employer].   If you have been instructed to have an in-person evaluation today at a local Urgent Care facility, please use the link below. It will take you to a list of all of our available [facility], including address, phone number and hours of operation. Please do not delay care.  [facility]  If you or a family member do not have a primary care provider, use the link below to schedule a visit and establish care. When you choose a [facility] primary care physician or advanced practice provider, you gain a long-term partner in health. Find a Primary Care Provider  Learn more about [facility] in-office and virtual care options: [facility] - Get Care Now "

## 2024-02-14 ENCOUNTER — Ambulatory Visit: Admitting: Family Medicine

## 2024-02-14 ENCOUNTER — Encounter: Payer: Self-pay | Admitting: Family Medicine

## 2024-02-14 ENCOUNTER — Ambulatory Visit: Payer: Self-pay | Admitting: Family Medicine

## 2024-02-14 VITALS — BP 112/70 | HR 67 | Temp 97.7°F | Ht 72.0 in | Wt 219.8 lb

## 2024-02-14 DIAGNOSIS — R7303 Prediabetes: Secondary | ICD-10-CM | POA: Diagnosis not present

## 2024-02-14 DIAGNOSIS — Z880 Allergy status to penicillin: Secondary | ICD-10-CM

## 2024-02-14 DIAGNOSIS — D649 Anemia, unspecified: Secondary | ICD-10-CM | POA: Diagnosis not present

## 2024-02-14 DIAGNOSIS — E538 Deficiency of other specified B group vitamins: Secondary | ICD-10-CM | POA: Diagnosis not present

## 2024-02-14 DIAGNOSIS — M79675 Pain in left toe(s): Secondary | ICD-10-CM

## 2024-02-14 LAB — CBC WITH DIFFERENTIAL/PLATELET
Basophils Absolute: 0.1 K/uL (ref 0.0–0.1)
Basophils Relative: 0.6 % (ref 0.0–3.0)
Eosinophils Absolute: 0.3 K/uL (ref 0.0–0.7)
Eosinophils Relative: 3.6 % (ref 0.0–5.0)
HCT: 38 % — ABNORMAL LOW (ref 39.0–52.0)
Hemoglobin: 12.3 g/dL — ABNORMAL LOW (ref 13.0–17.0)
Lymphocytes Relative: 26.7 % (ref 12.0–46.0)
Lymphs Abs: 2.2 K/uL (ref 0.7–4.0)
MCHC: 32.3 g/dL (ref 30.0–36.0)
MCV: 81.8 fl (ref 78.0–100.0)
Monocytes Absolute: 0.7 K/uL (ref 0.1–1.0)
Monocytes Relative: 8.1 % (ref 3.0–12.0)
Neutro Abs: 5.1 K/uL (ref 1.4–7.7)
Neutrophils Relative %: 61 % (ref 43.0–77.0)
Platelets: 349 K/uL (ref 150.0–400.0)
RBC: 4.64 Mil/uL (ref 4.22–5.81)
RDW: 17.2 % — ABNORMAL HIGH (ref 11.5–15.5)
WBC: 8.3 K/uL (ref 4.0–10.5)

## 2024-02-14 LAB — IBC + FERRITIN
Ferritin: 15.4 ng/mL — ABNORMAL LOW (ref 22.0–322.0)
Iron: 41 ug/dL — ABNORMAL LOW (ref 42–165)
Saturation Ratios: 10.3 % — ABNORMAL LOW (ref 20.0–50.0)
TIBC: 397.6 ug/dL (ref 250.0–450.0)
Transferrin: 284 mg/dL (ref 212.0–360.0)

## 2024-02-14 LAB — VITAMIN B12: Vitamin B-12: 826 pg/mL (ref 211–911)

## 2024-02-14 MED ORDER — HYDROCODONE-ACETAMINOPHEN 10-325 MG PO TABS: 1.0000 | ORAL_TABLET | Freq: Two times a day (BID) | ORAL | 0 refills | Status: DC | PRN

## 2024-02-14 NOTE — Patient Instructions (Addendum)
 Please stop by lab before you go- also pick up stool collect kit If you have mychart- we will send your results within 3 business days of us  receiving them.  If you do not have mychart- we will call you about results within 5 business days of us  receiving them.  *please also note that you will see labs on mychart as soon as they post. I will later go in and write notes on them- will say notes from Dr. Katrinka   We have placed a referral for you today to podiatry and allergy- please call their # below if you do not hear within a week - but for allergy .... [facility] [address] [place], [place]  [address] [phone] (phone)  Glad you are feeing better - please see us  back if fail to continue to improve  Recommended follow up: Return in about 4 months (around 06/13/2024) for physical or sooner if needed.Schedule b4 you leave. Or up to 6 months for physical

## 2024-02-14 NOTE — Addendum Note (Signed)
 Addended by: KATRINKA GARNETTE KIDD on: 02/14/2024 05:37 PM   Modules accepted: Level of Service

## 2024-02-14 NOTE — Progress Notes (Signed)
 " Phone [phone] In person visit   Subjective:   Joseph Fuentes is a 69 y.o. year old very pleasant male patient who presents for/with See problem oriented charting Chief Complaint  Patient presents with   Medication Refill   Follow-up    Here for health management follow-up and medication updates/refills. Wants to also talk about the pneumonia he has last Saturday. Diagnose via tele-visit. Was given antibiotic, steroids and cough meds.      Toe Pain    Still having toe pain. Wondering if he needs a referral. Also wants to see if there is a penicillin test. He has been told all his life that he is allergic but has never taken it. As sick as he was last week, wondering if penicillin would have been a better choice. Last question- he got a pneumonia vaccine last June. Wondering why it didn't help.     Past Medical History-  Patient Active Problem List   Diagnosis Date Noted   Chronic back pain on long term narcotics 02/17/2022    Priority: High   Drug-induced myopathy 02/17/2022    Priority: Medium    Hypothyroidism 02/17/2022    Priority: Medium    BPH associated with nocturia 02/17/2022    Priority: Medium    OSA on CPAP 02/17/2022    Priority: Medium    Memory change 02/07/2022    Priority: Medium    Chronic migraine without aura without status migrainosus, not intractable 05/12/2020    Priority: Medium    Hyperlipidemia, unspecified 04/20/2009    Priority: Medium    Anxiety state 04/20/2009    Priority: Medium    GERD 03/10/2009    Priority: Medium    Irritable bowel syndrome 03/10/2009    Priority: Medium    Other allergic rhinitis 04/24/2019    Priority: Low   Polyneuropathy 11/13/2018    Priority: Low   Gastroparesis 04/07/2009    Priority: Low   Pain in joint, shoulder region 08/19/2014    Priority: 1.   Left knee pain 04/30/2013    Priority: 1.   Plantar fasciitis 03/06/2013    Priority: 1.    Medications- reviewed and updated Current Outpatient  Medications  Medication Sig Dispense Refill   albuterol  (VENTOLIN  HFA) 108 (90 Base) MCG/ACT inhaler Inhale 1-2 puffs into the lungs every 6 (six) hours as needed. 8 g 0   ALLEGRA-D ALLERGY & CONGESTION 180-240 MG 24 hr tablet Take 1 tablet by mouth daily.   3   aspirin-acetaminophen -caffeine (EXCEDRIN MIGRAINE) 250-250-65 MG tablet Take by mouth every 6 (six) hours as needed for headache.     colesevelam  (WELCHOL ) 625 MG tablet TAKE 3 TABLETS (1,875 MG TOTAL) BY MOUTH 2 (TWO) TIMES DAILY WITH A MEAL. 540 tablet 3   desoximetasone (TOPICORT) 0.25 % cream Apply 1 application topically daily as needed.     DEXILANT 60 MG capsule Take 60 mg by mouth every other day.     diazepam  (VALIUM ) 5 MG tablet Take 1 pill 30 to 60 minutes prior to MRI or back injection. 1 tablet 5   docusate sodium (COLACE) 100 MG capsule Take 100 mg by mouth daily.      eletriptan  (RELPAX ) 40 MG tablet Take 1 tablet (40 mg total) by mouth as needed for migraine or headache. May repeat in 2 hours if headache persists or recurs. 42 tablet 3   ezetimibe  (ZETIA ) 10 MG tablet Take 1 tablet (10 mg total) by mouth daily. 90 tablet 1  fluticasone  (CUTIVATE ) 0.005 % ointment Apply 1 application topically 2 (two) times daily.     fluticasone  (FLONASE ) 50 MCG/ACT nasal spray Place 2 sprays into both nostrils daily. 48 g 3   ibuprofen  (ADVIL ) 800 MG tablet Take 1 tablet (800 mg total) by mouth 2 (two) times daily as needed. 180 tablet 3   ipratropium (ATROVENT ) 0.06 % nasal spray 1 spray in each nostril 2 times daily 45 mL 3   NAFTIN 2 % CREA as needed.     omeprazole (PRILOSEC) 40 MG capsule Take 1-2 tablets by mouth at bedtime.      POTASSIUM PO Take 99 mg by mouth.      SYNTHROID 112 MCG tablet Take 112 mcg by mouth daily before breakfast.     tamsulosin (FLOMAX) 0.4 MG CAPS capsule 1 capsule 30 minutes after the same meal each day     topiramate  (TOPAMAX ) 200 MG tablet Take 1 tablet (200 mg total) by mouth daily. 90 tablet 0    traZODone  (DESYREL ) 50 MG tablet TAKE 1/2 TO 1 TABLET BY MOUTH AT BEDTIME AS NEEDED FOR SLEEP 90 tablet 2   triamcinolone  ointment (KENALOG ) 0.1 % as needed.     HYDROcodone -acetaminophen  (NORCO) 10-325 MG tablet Take 1 tablet by mouth 2 (two) times daily as needed (do not drive for 8 hours after taking.). 60 tablet 0   No current facility-administered medications for this visit.     Objective:  BP 112/70 (BP Location: Left Arm, Patient Position: Sitting, Cuff Size: Normal)   Pulse 67   Temp 97.7 F (36.5 C) (Temporal)   Ht 6' (1.829 m)   Wt 219 lb 12.8 oz (99.7 kg)   SpO2 95%   BMI 29.81 kg/m  Gen: NAD, resting comfortably CV: RRR no murmurs rubs or gallops Lungs: CTAB no crackles, wheeze, rhonchi Ext: no edema Skin: warm, dry     Assessment and Plan    #anemia- mild anemia unclear cause. No blood in stool or melena other than recent fissure with illness- he will do collection after this has healed. 2018 colonoscopy normal  # B12 deficiency S: Current treatment/medication (oral vs. IM): 1000 mcg daily . Cramping better on medications- not as much golf lately Lab Results  Component Value Date   VITAMINB12 213 10/16/2023   A/P: B12 with relative deficiency- update B12 today    # Recent E visit S: Diagnosed with pneumonia 02/04/2024 by e-visit- symptom(s) started late december.  Was treated aggressively with levofloxacin , prednisone , albuterol , promethazine , Tessalon  given worsening symptoms after a week despite over-the-counter medicines. Temp up to 104.5. did not test for flu  -significant improvement within 48 hours -we discussed Prevnar 20 does not prevent all pneumonias but also that its difficult to diagnose pneumonia by e visit A/P: has improved but feeling fatigued after workdays still.    #penicillin allergy?  Was told had a reaction as an infant-offered referral to allergist for consideration of penicillin skin testing- has seen [facility] in past- would like  another opinion. His father had told him  he could die if tested even- he's anxious but wants to be tested.   # ongoing toe pain S: Patient with chronic cold feet sensation.  Patient was sitting in chair and feels like pain in the toe and the left foot extremely severe even at September visit.  No color change that he is aware.  No pain with walking.  Blood flow test okay in 2019.  At last visit he was noted  try to sleep in bed instead and we also offered ABI testing but he declined at that time.  We also discussed getting Dr. Ala opinion A/P: looking at toe today appear smore ingrown- mild erythema beyond the nail and onychomycosis noted- will refer to podiatry- they will need to look at both feet but worse on the left    # Hyperglycemia/insulin resistance/prediabetes S:  Medication: None-last numbers in diabetes range Exercise and diet- patient down 8 pounds from last visit despite recent prednisone  and holidays. He thinks 5 lbs from illnes.  Lab Results  Component Value Date   HGBA1C 6.7 (H) 10/16/2023   HGBA1C 5.7 (H) 11/13/2018   A/P: he prefers to continue to work on healthy eating and regular exercise and weight loss and recheck next visit after been full 6 months and further out from prendisone   #Chronic back pain S: medication: hydrocodone  10-325 per primary care doctor Dr. Gib and now filled by us - 60 tablets lasts 90 days - uses prn with activity - after shot he feels more back to baseline use but was really bothering him prior and needing more than typical for him. Saw Dr. Joane and had updated injection through radiology- so so results this time 70% improvement when in past up to 90 - Lyrica  didn't help- will remove from list A/P: ongoing pain issues on narcotics intermittently- thankfully after injection is doing better but is running shorter so will refill- has made it 4 months which is still great  # nonobstructive CAD  #hyperlipidemia #IBS S: Medication:WelChol  625  mg 3 tablets twice daily (has to space from thyroid  medicine) -didn't tolerate statins due to myalgia, Zetia  10 mg with some myalgias as well but tolerating enough  -no chest pain or shortness of breath outside of pneumonia  Lab Results  Component Value Date   CHOL 171 10/16/2023   HDL 51.10 10/16/2023   LDLCALC 102 (H) 10/16/2023   TRIG 90.0 10/16/2023   CHOLHDL 3 10/16/2023  A/P: still imperfect control but max tolerable dose- continue current medications  Coronary artery disease asymptomatic continue current medications    #hypothyroidism- with Dr. Faythe due to oscillatoins S: compliant On thyroid  medication-Synthroid 224 mcg -welchol  not ideal for thyroid  per Dr. Faythe A/P: stable in recent checks with Dr. Faythe- continue current medications    # BPH S: Medication: Tamsulosin 0.4 mg  A/P: reasonable control continue current medications     # Migraines-follows with neurology S: Medication: Relpax  as needed- not much need, Topamax  200 mg A/P: overall doing well lately- continue to monitor      # Anxiety/insomnia S:Medication: Trial trazodone  April 2024 A/P: reasonable control- continue current medications    # GERD- Dr. Rosalie manages S:Medication: Dexilant 60 mg, also omeprazole  A/P: doing reasonably well continue current medications - this has been only effective regimen    Recommended follow up: Return in about 4 months (around 06/13/2024) for physical or sooner if needed.Schedule b4 you leave. Future Appointments  Date Time Provider Department Center  08/25/2024  3:30 PM Penumalli, Eduard SAUNDERS, MD [facility] None    Lab/Order associations:   ICD-10-CM   1. History of penicillin allergy  Z88.0 Ambulatory referral to Allergy    2. Prediabetes  R73.03     3. Anemia, unspecified type  D64.9 IBC + Ferritin    CBC w/Diff    4. Great toe pain, left  M79.675 Ambulatory referral to Podiatry    5. B12 deficiency  E53.8 Vitamin B12  Meds ordered this encounter   Medications   HYDROcodone -acetaminophen  (NORCO) 10-325 MG tablet    Sig: Take 1 tablet by mouth 2 (two) times daily as needed (do not drive for 8 hours after taking.).    Dispense:  60 tablet    Refill:  0    Return precautions advised.  Garnette Lukes, MD  "

## 2024-02-18 ENCOUNTER — Encounter: Payer: Self-pay | Admitting: Family Medicine

## 2024-02-19 ENCOUNTER — Other Ambulatory Visit: Payer: Self-pay

## 2024-02-19 MED ORDER — HYDROCODONE-ACETAMINOPHEN 10-325 MG PO TABS: 1.0000 | ORAL_TABLET | Freq: Two times a day (BID) | ORAL | 0 refills | Status: AC | PRN

## 2024-02-22 ENCOUNTER — Ambulatory Visit: Admitting: Podiatry

## 2024-02-22 ENCOUNTER — Ambulatory Visit

## 2024-02-22 ENCOUNTER — Encounter: Payer: Self-pay | Admitting: Podiatry

## 2024-02-22 DIAGNOSIS — L089 Local infection of the skin and subcutaneous tissue, unspecified: Secondary | ICD-10-CM

## 2024-02-22 DIAGNOSIS — L600 Ingrowing nail: Secondary | ICD-10-CM | POA: Diagnosis not present

## 2024-02-22 DIAGNOSIS — D169 Benign neoplasm of bone and articular cartilage, unspecified: Secondary | ICD-10-CM

## 2024-02-22 NOTE — Patient Instructions (Signed)

## 2024-02-23 ENCOUNTER — Other Ambulatory Visit: Payer: Self-pay | Admitting: Neurology

## 2024-02-23 DIAGNOSIS — G43709 Chronic migraine without aura, not intractable, without status migrainosus: Secondary | ICD-10-CM

## 2024-02-26 NOTE — Telephone Encounter (Signed)
 Last seen on 06/27/23 Follow up scheduled 08/25/24

## 2024-02-27 NOTE — Progress Notes (Signed)
 Subjective:   Patient ID: Joseph Fuentes, male   DOB: 69 y.o.   MRN: [id number]   HPI Patient presents with several problems with 1 being thickness and dystrophic changes to his nailbeds and secondarily some irritation of the distal portion of the digits themselves.  Patient states that they get quite uncomfortable left over right and he tries to trim the nailbeds to the best of his ability.  Patient does not smoke likes to be active   Review of Systems  All other systems reviewed and are negative.       Objective:  Physical Exam Vitals and nursing note reviewed.  Constitutional:      Appearance: He is well-developed.  Pulmonary:     Effort: Pulmonary effort is normal.  Musculoskeletal:        General: Normal range of motion.  Skin:    General: Skin is warm.  Neurological:     Mental Status: He is alert.     Neurovascular status found to be intact muscle strength found to be adequate range of motion within normal limits with patient noted to have thick yellow dystrophic nailbeds hallux left over right painful distal redness which may be due to bone structure or other pathology that could be secondary problem.  Good digital perfusion well-oriented x 3     Assessment:  Chronic deformed hallux nails bilateral with pain with distal irritation which may be bone spur formation     Plan:  H&P x-rays with hallux elevation views do not currently indicate distal bone spur so I am assuming nails are the issue but still may require some other treatment and at this point I recommended nail removal permanent.  I did explain procedures risk patient wants surgery understands risk and at this point signed consent form.  I infiltrated each big toe 60 mg Xylocaine  Marcaine mixture sterile prep done using sterile instrumentation removed the hallux nails exposed matrix applied phenol for applications 30 seconds followed by alcohol lavage sterile dressing gave instructions does soaks wear dressings 24  hours take them off earlier if throbbing were to occur and encouraged to call questions concerns which may arise

## 2024-03-03 ENCOUNTER — Other Ambulatory Visit

## 2024-06-19 ENCOUNTER — Encounter: Admitting: Family Medicine

## 2024-08-25 ENCOUNTER — Ambulatory Visit: Admitting: Diagnostic Neuroimaging
